# Patient Record
Sex: Female | Born: 1940 | Race: Black or African American | Hispanic: No | Smoking: Never smoker
Health system: Southern US, Community
[De-identification: ages and names within clinical notes are randomized; demographics above are authoritative.]

## PROBLEM LIST (undated history)

## (undated) DIAGNOSIS — E049 Nontoxic goiter, unspecified: Secondary | ICD-10-CM

## (undated) DIAGNOSIS — D573 Sickle-cell trait: Secondary | ICD-10-CM

## (undated) DIAGNOSIS — C50919 Malignant neoplasm of unspecified site of unspecified female breast: Principal | ICD-10-CM

## (undated) DIAGNOSIS — C801 Malignant (primary) neoplasm, unspecified: Secondary | ICD-10-CM

## (undated) DIAGNOSIS — I10 Essential (primary) hypertension: Secondary | ICD-10-CM

## (undated) DIAGNOSIS — F329 Major depressive disorder, single episode, unspecified: Secondary | ICD-10-CM

## (undated) DIAGNOSIS — E039 Hypothyroidism, unspecified: Secondary | ICD-10-CM

## (undated) HISTORY — PX: APPENDECTOMY: SHX54

## (undated) HISTORY — DX: Essential (primary) hypertension: I10

## (undated) HISTORY — DX: Nontoxic goiter, unspecified: E04.9

## (undated) HISTORY — PX: CATARACT EXTRACTION: SUR2

## (undated) HISTORY — DX: Malignant neoplasm of unspecified site of unspecified female breast: C50.919

## (undated) HISTORY — PX: ABDOMINAL HYSTERECTOMY: SHX81

## (undated) HISTORY — PX: CHOLECYSTECTOMY: SHX55

## (undated) HISTORY — PX: HEMORRHOID SURGERY: SHX153

## (undated) HISTORY — PX: TONSILECTOMY, ADENOIDECTOMY, BILATERAL MYRINGOTOMY AND TUBES: SHX2538

## (undated) HISTORY — DX: Hypothyroidism, unspecified: E03.9

## (undated) HISTORY — DX: Major depressive disorder, single episode, unspecified: F32.9

## (undated) HISTORY — PX: BIOPSY THYROID: PRO38

---

## 1963-01-08 HISTORY — PX: COLON RESECTION: SHX5231

## 2001-03-11 ENCOUNTER — Encounter: Payer: Self-pay | Admitting: Family Medicine

## 2001-03-11 ENCOUNTER — Ambulatory Visit (HOSPITAL_COMMUNITY): Admission: RE | Admit: 2001-03-11 | Discharge: 2001-03-11 | Payer: Self-pay | Admitting: Family Medicine

## 2002-07-23 ENCOUNTER — Ambulatory Visit (HOSPITAL_COMMUNITY): Admission: RE | Admit: 2002-07-23 | Discharge: 2002-07-23 | Payer: Self-pay | Admitting: Family Medicine

## 2003-08-17 ENCOUNTER — Ambulatory Visit (HOSPITAL_COMMUNITY): Admission: RE | Admit: 2003-08-17 | Discharge: 2003-08-17 | Payer: Self-pay | Admitting: Family Medicine

## 2004-09-11 ENCOUNTER — Ambulatory Visit (HOSPITAL_COMMUNITY): Admission: RE | Admit: 2004-09-11 | Discharge: 2004-09-11 | Payer: Self-pay | Admitting: Family Medicine

## 2004-10-03 ENCOUNTER — Ambulatory Visit (HOSPITAL_COMMUNITY): Admission: RE | Admit: 2004-10-03 | Discharge: 2004-10-03 | Payer: Self-pay | Admitting: Family Medicine

## 2005-05-28 ENCOUNTER — Ambulatory Visit (HOSPITAL_COMMUNITY): Admission: RE | Admit: 2005-05-28 | Discharge: 2005-05-28 | Payer: Self-pay | Admitting: Ophthalmology

## 2005-06-06 ENCOUNTER — Ambulatory Visit (HOSPITAL_COMMUNITY): Admission: RE | Admit: 2005-06-06 | Discharge: 2005-06-06 | Payer: Self-pay | Admitting: General Surgery

## 2005-06-06 ENCOUNTER — Encounter (INDEPENDENT_AMBULATORY_CARE_PROVIDER_SITE_OTHER): Payer: Self-pay | Admitting: Specialist

## 2005-06-07 ENCOUNTER — Ambulatory Visit (HOSPITAL_COMMUNITY): Admission: RE | Admit: 2005-06-07 | Discharge: 2005-06-07 | Payer: Self-pay | Admitting: General Surgery

## 2005-06-11 ENCOUNTER — Encounter (INDEPENDENT_AMBULATORY_CARE_PROVIDER_SITE_OTHER): Payer: Self-pay | Admitting: *Deleted

## 2005-06-11 ENCOUNTER — Observation Stay (HOSPITAL_COMMUNITY): Admission: RE | Admit: 2005-06-11 | Discharge: 2005-06-12 | Payer: Self-pay | Admitting: General Surgery

## 2005-11-04 ENCOUNTER — Ambulatory Visit (HOSPITAL_COMMUNITY): Admission: RE | Admit: 2005-11-04 | Discharge: 2005-11-04 | Payer: Self-pay | Admitting: Internal Medicine

## 2006-01-07 HISTORY — PX: CATARACT EXTRACTION: SUR2

## 2006-12-23 ENCOUNTER — Ambulatory Visit (HOSPITAL_COMMUNITY): Admission: RE | Admit: 2006-12-23 | Discharge: 2006-12-23 | Payer: Self-pay | Admitting: Family Medicine

## 2007-01-08 HISTORY — PX: MASTECTOMY: SHX3

## 2007-01-14 ENCOUNTER — Ambulatory Visit (HOSPITAL_COMMUNITY): Admission: RE | Admit: 2007-01-14 | Discharge: 2007-01-14 | Payer: Self-pay | Admitting: Family Medicine

## 2007-01-14 ENCOUNTER — Encounter (INDEPENDENT_AMBULATORY_CARE_PROVIDER_SITE_OTHER): Payer: Self-pay | Admitting: Diagnostic Radiology

## 2007-01-23 ENCOUNTER — Ambulatory Visit (HOSPITAL_COMMUNITY): Admission: RE | Admit: 2007-01-23 | Discharge: 2007-01-23 | Payer: Self-pay | Admitting: Family Medicine

## 2007-02-06 ENCOUNTER — Encounter (INDEPENDENT_AMBULATORY_CARE_PROVIDER_SITE_OTHER): Payer: Self-pay | Admitting: General Surgery

## 2007-02-06 ENCOUNTER — Observation Stay (HOSPITAL_COMMUNITY): Admission: RE | Admit: 2007-02-06 | Discharge: 2007-02-08 | Payer: Self-pay | Admitting: General Surgery

## 2007-02-20 ENCOUNTER — Encounter (HOSPITAL_COMMUNITY): Admission: RE | Admit: 2007-02-20 | Discharge: 2007-03-22 | Payer: Self-pay | Admitting: Oncology

## 2007-02-20 ENCOUNTER — Ambulatory Visit (HOSPITAL_COMMUNITY): Payer: Self-pay | Admitting: Oncology

## 2007-03-30 ENCOUNTER — Encounter (HOSPITAL_COMMUNITY): Admission: RE | Admit: 2007-03-30 | Discharge: 2007-04-29 | Payer: Self-pay | Admitting: Oncology

## 2007-04-01 ENCOUNTER — Ambulatory Visit: Payer: Self-pay | Admitting: Internal Medicine

## 2007-04-01 ENCOUNTER — Encounter (HOSPITAL_COMMUNITY): Payer: Self-pay | Admitting: Oncology

## 2007-04-08 ENCOUNTER — Ambulatory Visit (HOSPITAL_COMMUNITY): Admission: RE | Admit: 2007-04-08 | Discharge: 2007-04-08 | Payer: Self-pay | Admitting: General Surgery

## 2007-04-27 ENCOUNTER — Ambulatory Visit (HOSPITAL_COMMUNITY): Payer: Self-pay | Admitting: Oncology

## 2007-05-12 ENCOUNTER — Encounter (HOSPITAL_COMMUNITY): Admission: RE | Admit: 2007-05-12 | Discharge: 2007-06-11 | Payer: Self-pay | Admitting: Oncology

## 2007-07-07 ENCOUNTER — Encounter (HOSPITAL_COMMUNITY): Admission: RE | Admit: 2007-07-07 | Discharge: 2007-08-06 | Payer: Self-pay | Admitting: Family Medicine

## 2007-07-07 ENCOUNTER — Encounter (HOSPITAL_COMMUNITY): Payer: Self-pay | Admitting: Oncology

## 2007-07-07 ENCOUNTER — Ambulatory Visit: Payer: Self-pay | Admitting: Cardiovascular Disease

## 2007-07-14 ENCOUNTER — Ambulatory Visit (HOSPITAL_COMMUNITY): Payer: Self-pay | Admitting: Oncology

## 2007-08-25 ENCOUNTER — Encounter (HOSPITAL_COMMUNITY): Admission: RE | Admit: 2007-08-25 | Discharge: 2007-09-24 | Payer: Self-pay | Admitting: Oncology

## 2007-08-27 ENCOUNTER — Encounter (HOSPITAL_COMMUNITY): Admission: RE | Admit: 2007-08-27 | Discharge: 2007-09-26 | Payer: Self-pay | Admitting: Oncology

## 2007-09-15 ENCOUNTER — Ambulatory Visit (HOSPITAL_COMMUNITY): Payer: Self-pay | Admitting: Oncology

## 2007-09-28 ENCOUNTER — Encounter (HOSPITAL_COMMUNITY): Payer: Self-pay | Admitting: Oncology

## 2007-09-28 ENCOUNTER — Ambulatory Visit: Payer: Self-pay | Admitting: Cardiology

## 2007-09-28 ENCOUNTER — Encounter (HOSPITAL_COMMUNITY): Admission: RE | Admit: 2007-09-28 | Discharge: 2007-10-28 | Payer: Self-pay | Admitting: Oncology

## 2007-11-17 ENCOUNTER — Ambulatory Visit (HOSPITAL_COMMUNITY): Payer: Self-pay | Admitting: Oncology

## 2007-11-17 ENCOUNTER — Encounter (HOSPITAL_COMMUNITY): Admission: RE | Admit: 2007-11-17 | Discharge: 2007-12-17 | Payer: Self-pay | Admitting: Oncology

## 2007-12-25 ENCOUNTER — Encounter (HOSPITAL_COMMUNITY): Admission: RE | Admit: 2007-12-25 | Discharge: 2008-01-24 | Payer: Self-pay | Admitting: Oncology

## 2008-01-04 ENCOUNTER — Encounter (HOSPITAL_COMMUNITY): Payer: Self-pay | Admitting: Oncology

## 2008-01-04 ENCOUNTER — Ambulatory Visit: Payer: Self-pay | Admitting: Cardiology

## 2008-01-19 ENCOUNTER — Ambulatory Visit (HOSPITAL_COMMUNITY): Payer: Self-pay | Admitting: Oncology

## 2008-02-09 ENCOUNTER — Encounter (HOSPITAL_COMMUNITY): Admission: RE | Admit: 2008-02-09 | Discharge: 2008-03-10 | Payer: Self-pay | Admitting: Oncology

## 2008-03-21 ENCOUNTER — Encounter (HOSPITAL_COMMUNITY): Payer: Self-pay | Admitting: Oncology

## 2008-03-21 ENCOUNTER — Encounter (HOSPITAL_COMMUNITY): Admission: RE | Admit: 2008-03-21 | Discharge: 2008-04-20 | Payer: Self-pay | Admitting: Oncology

## 2008-03-21 ENCOUNTER — Ambulatory Visit: Payer: Self-pay | Admitting: Cardiology

## 2008-03-22 ENCOUNTER — Ambulatory Visit (HOSPITAL_COMMUNITY): Payer: Self-pay | Admitting: Oncology

## 2008-05-03 ENCOUNTER — Encounter (HOSPITAL_COMMUNITY): Admission: RE | Admit: 2008-05-03 | Discharge: 2008-06-02 | Payer: Self-pay | Admitting: Oncology

## 2008-05-24 ENCOUNTER — Ambulatory Visit (HOSPITAL_COMMUNITY): Payer: Self-pay | Admitting: Oncology

## 2008-06-14 ENCOUNTER — Encounter (HOSPITAL_COMMUNITY): Admission: RE | Admit: 2008-06-14 | Discharge: 2008-07-14 | Payer: Self-pay | Admitting: Oncology

## 2008-06-20 ENCOUNTER — Ambulatory Visit: Payer: Self-pay | Admitting: Cardiology

## 2008-06-20 ENCOUNTER — Encounter (HOSPITAL_COMMUNITY): Payer: Self-pay | Admitting: Oncology

## 2008-07-08 ENCOUNTER — Ambulatory Visit (HOSPITAL_COMMUNITY): Payer: Self-pay | Admitting: Oncology

## 2008-07-08 ENCOUNTER — Encounter (HOSPITAL_COMMUNITY): Admission: RE | Admit: 2008-07-08 | Discharge: 2008-08-07 | Payer: Self-pay | Admitting: Internal Medicine

## 2008-07-27 ENCOUNTER — Ambulatory Visit (HOSPITAL_COMMUNITY): Admission: RE | Admit: 2008-07-27 | Discharge: 2008-07-27 | Payer: Self-pay | Admitting: General Surgery

## 2008-07-31 IMAGING — US US SOFT TISSUE HEAD/NECK
1 series · 14 of 25 positions shown · non-contrast
Comparison: none

HISTORY: Breast cancer, persistent right thyroid nodule

[Series 1: unknown · 0.09mm/px · 14 of 35 slices shown]
[im 1/35]
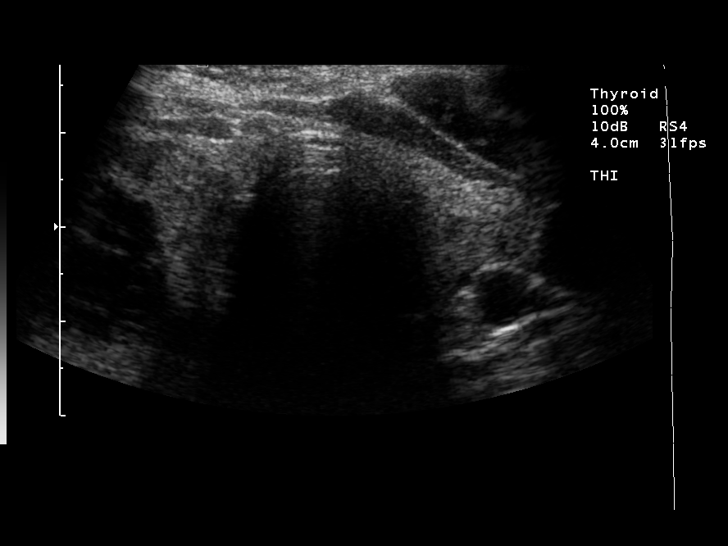
[im 3/35]
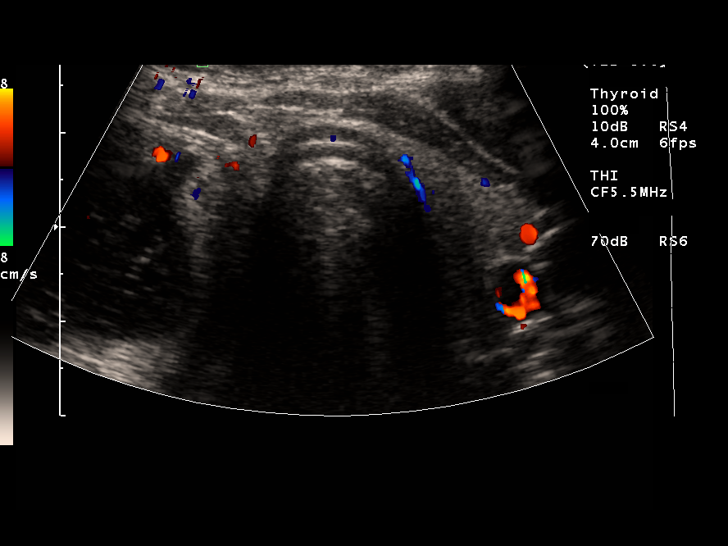
[im 6/35]
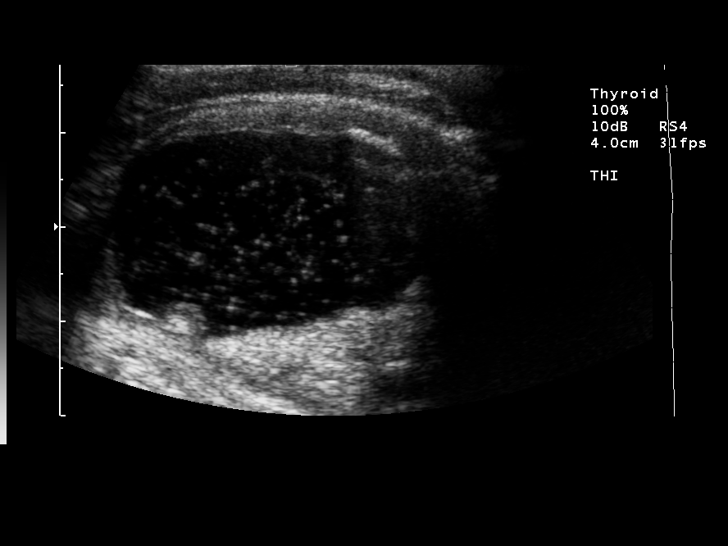
[im 9/35]
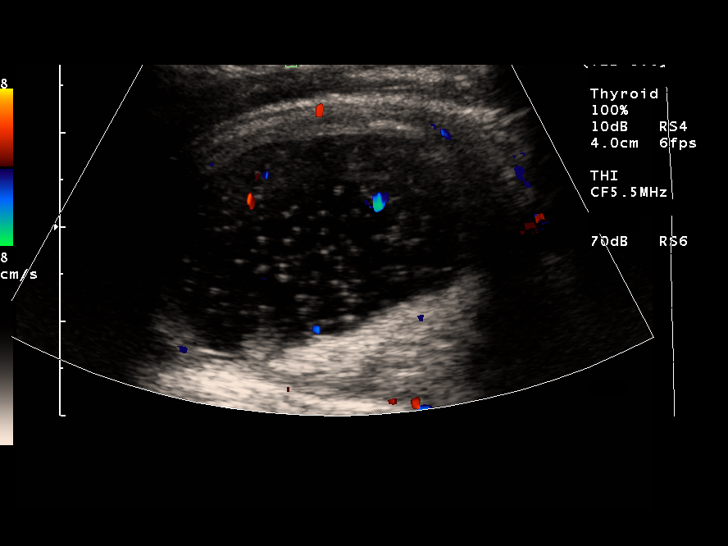
[im 12/35]
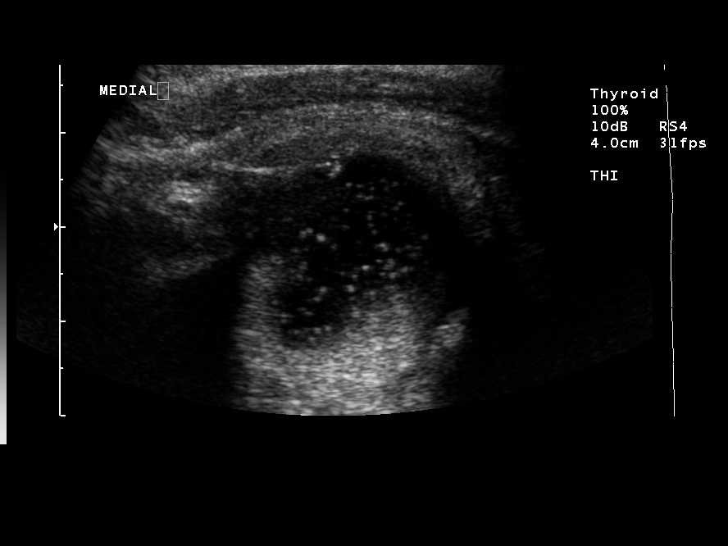
[im 13/35]
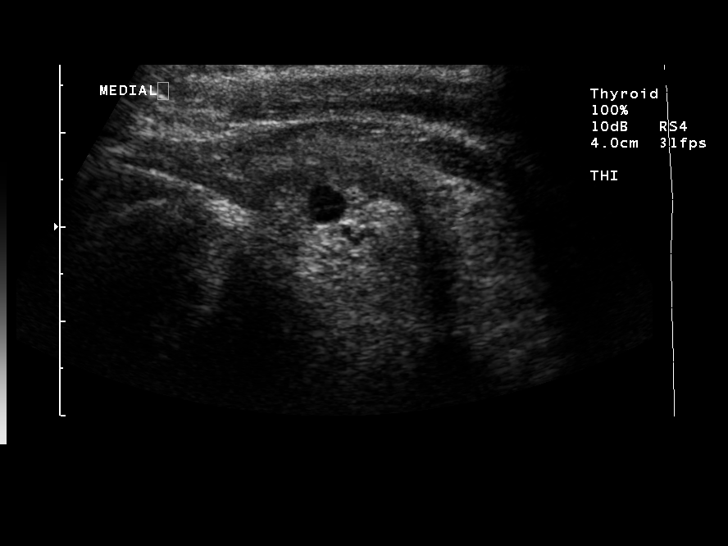
[im 16/35]
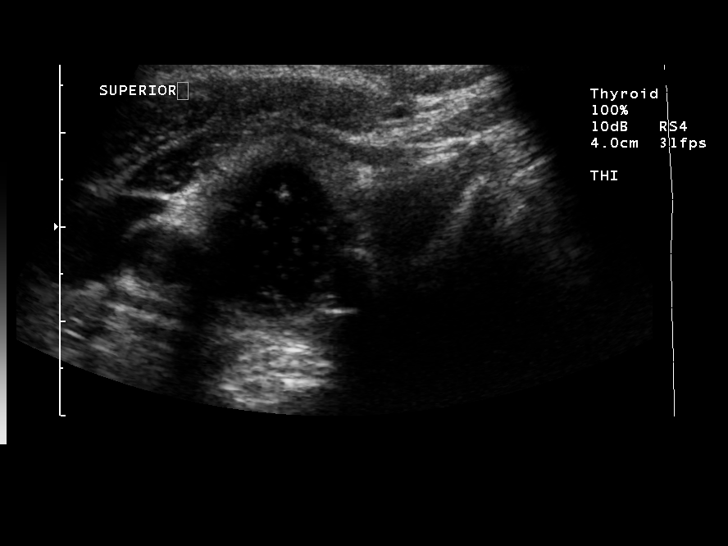
[im 19/35]
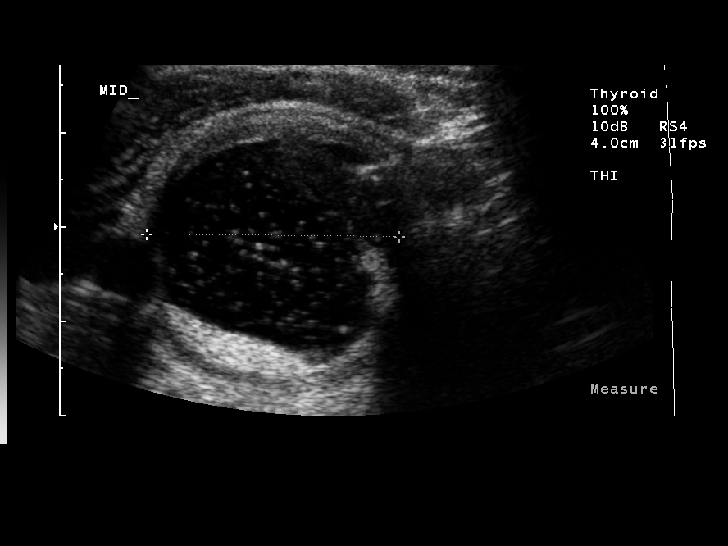
[im 22/35]
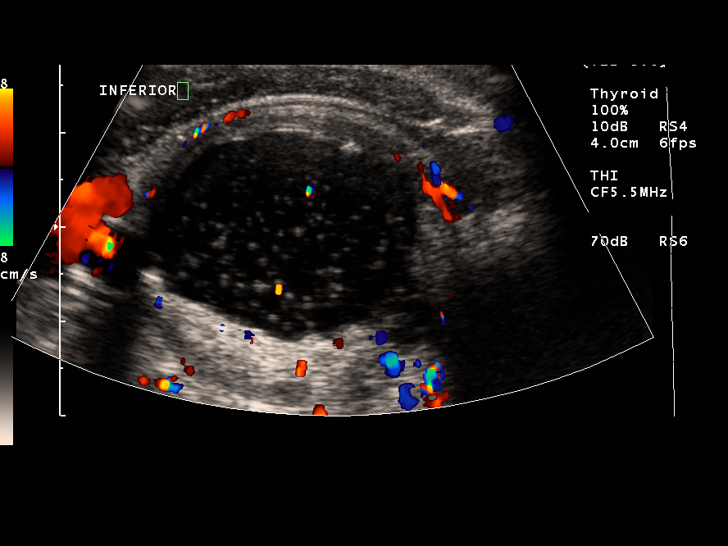
[im 23/35]
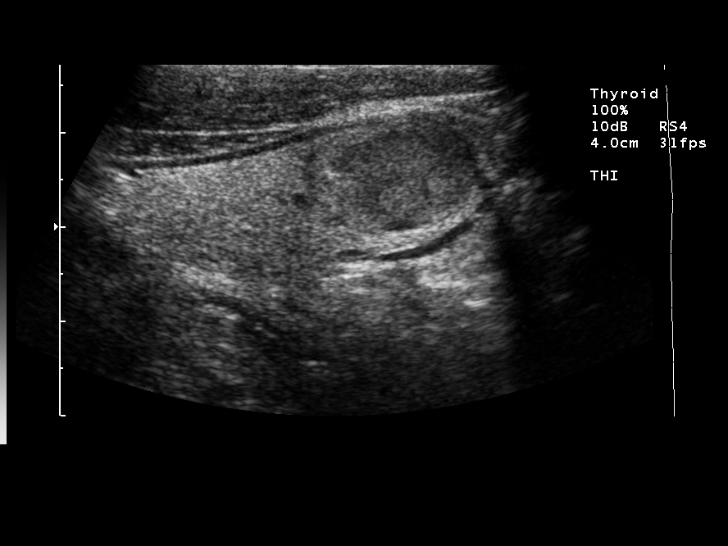
[im 26/35]
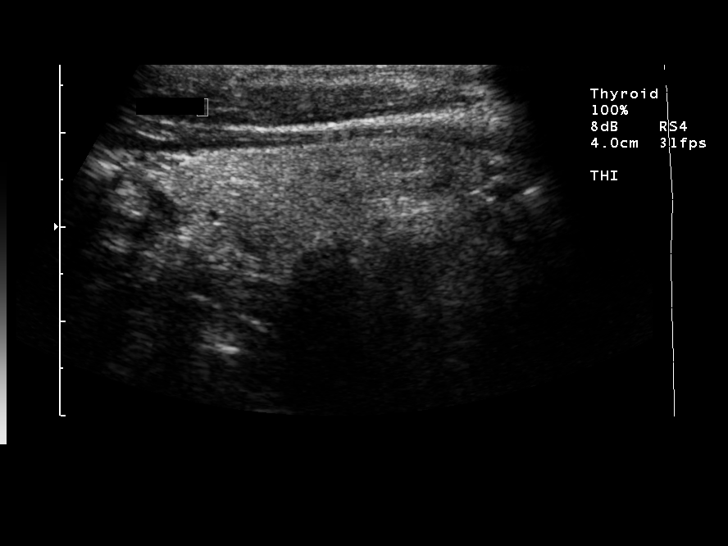
[im 29/35]
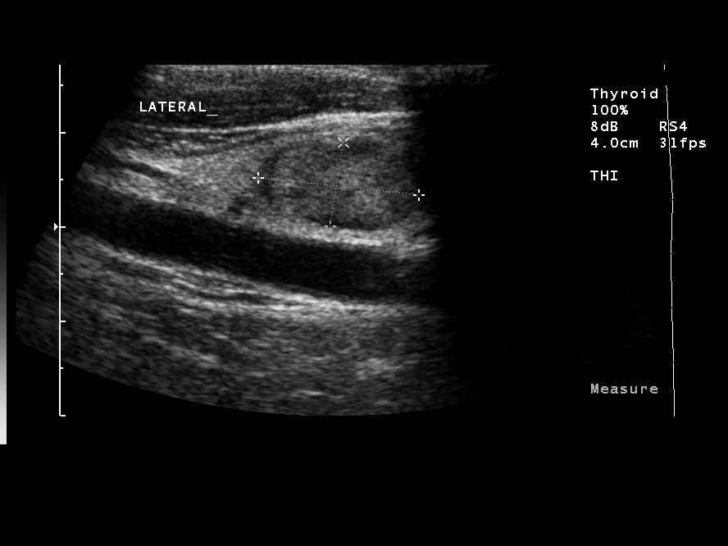
[im 32/35]
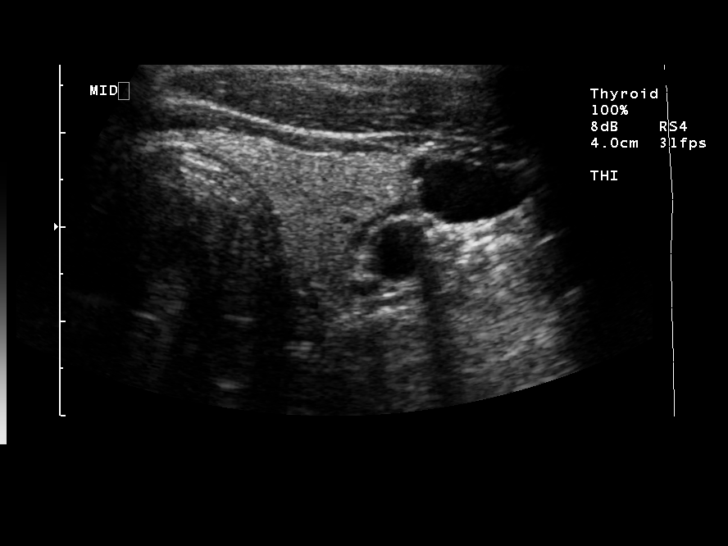
[im 35/35]
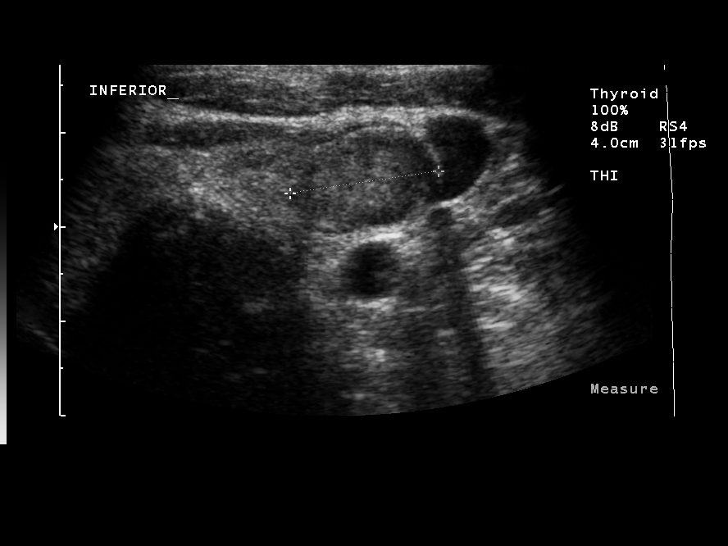

[14 of 25 positions shown; findings below may reference images not displayed]

THYROID ULTRASOUND:

Sonography of thyroid gland and regional soft tissues performed.

Right thyroid lobe measures 4.3 cm length x 2.3 cm AP x 3.3 cm transverse.
Left thyroid lobe measures 4.9 cm length x 1.6 cm AP x 1.5 cm transverse.
Thyroid isthmus 5 mm thick.
Small nodule identified within midleft thyroid lobe, 17 x 9 x 16 mm.
Large complex lesion identified within right thyroid lobe, 3.6 x 2.1 x 2.7 cm.
This lesion shows mobile internal echogenicities and dependent echogenicity
suggesting complex or hemorrhagic cyst.
No discrete mural nodule with blood flow is identified.
No regional adenopathy, or thyroid calcification.
IMPRESSION: Nonspecific 1.7 cm diameter solid-appearing nodules left thyroid lobe.
3.6 cm diameter complex cystic mass in right thyroid lobe, occupying a majority
below, containing mobile internal echogenicity the dependent echogenicity
suggesting blood or debris.
No discrete mural nodule identified.
Followup ultrasound recommended in 4 to 6 months to reassess these findings in
order to exclude thyroid tumor.

## 2008-12-12 ENCOUNTER — Ambulatory Visit (HOSPITAL_COMMUNITY): Payer: Self-pay | Admitting: Oncology

## 2008-12-16 ENCOUNTER — Ambulatory Visit: Payer: Self-pay | Admitting: Dentistry

## 2008-12-16 ENCOUNTER — Encounter: Admission: AD | Admit: 2008-12-16 | Discharge: 2008-12-16 | Payer: Self-pay | Admitting: Dentistry

## 2008-12-26 ENCOUNTER — Ambulatory Visit (HOSPITAL_COMMUNITY): Admission: RE | Admit: 2008-12-26 | Discharge: 2008-12-26 | Payer: Self-pay | Admitting: Oncology

## 2009-01-26 IMAGING — US US SOFT TISSUE HEAD/NECK
1 series · 13 of 25 positions shown · non-contrast
Comparison: 02/10/2007

CLINICAL DATA: Goiter and a hemorrhagic cyst of the thyroid gland.

THYROID ULTRASOUND
TECHNIQUE: Ultrasound examination of the thyroid gland and
adjacent soft tissues was performed.

[Series 1: unknown · 0.07mm/px · 13 of 49 slices shown]
[im 1/49]
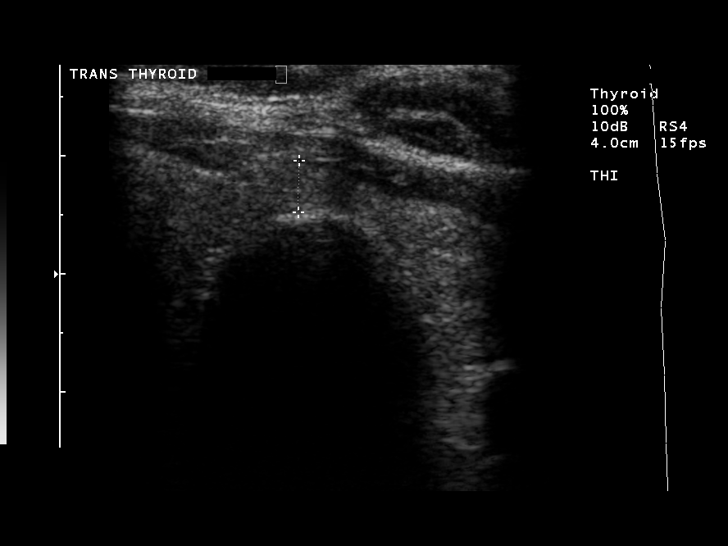
[im 5/49]
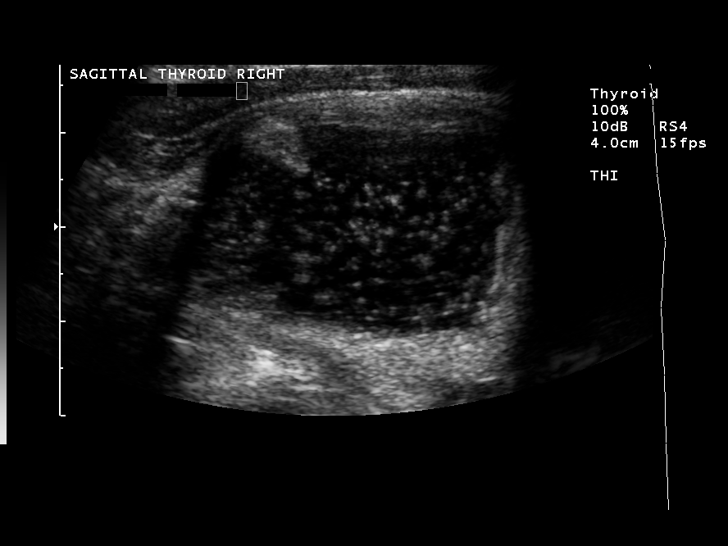
[im 9/49]
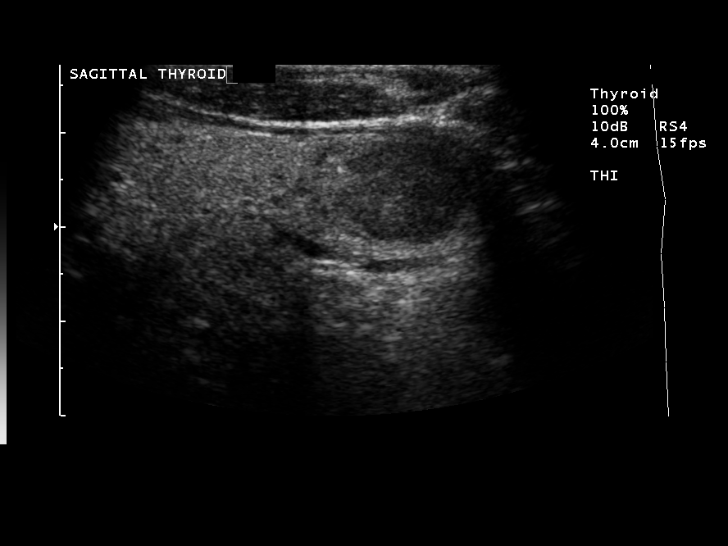
[im 13/49]
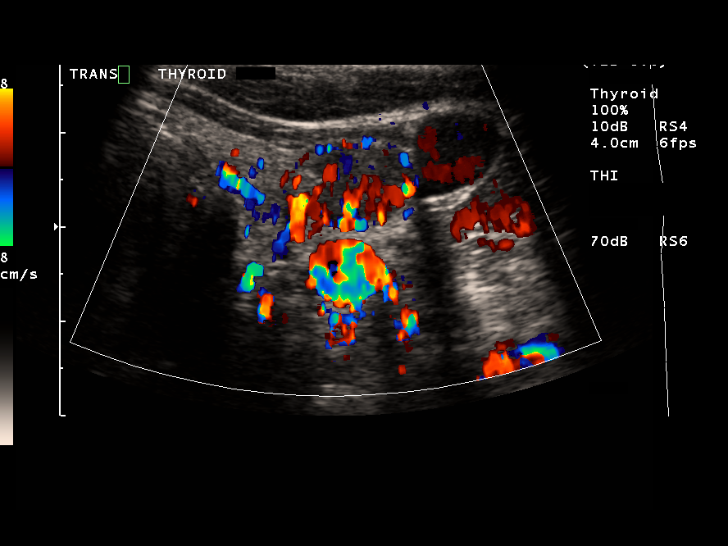
[im 17/49]
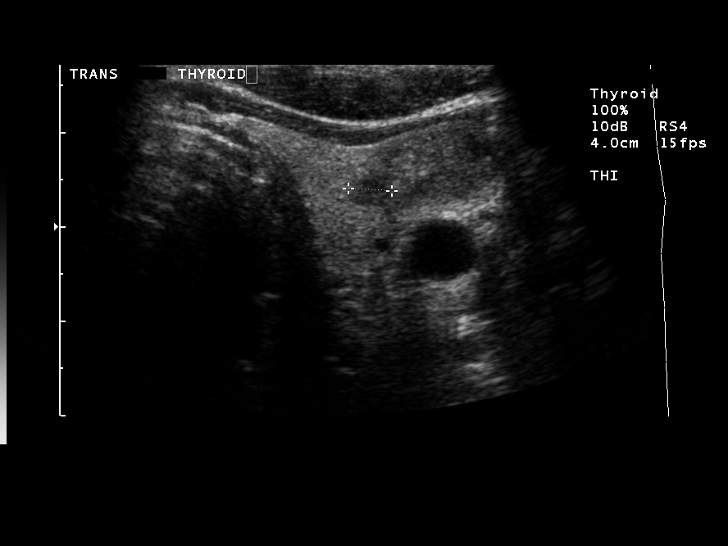
[im 21/49]
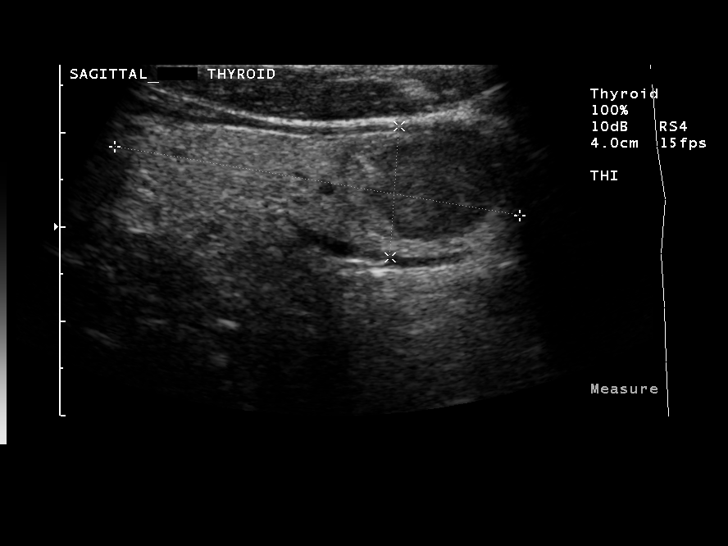
[im 25/49]
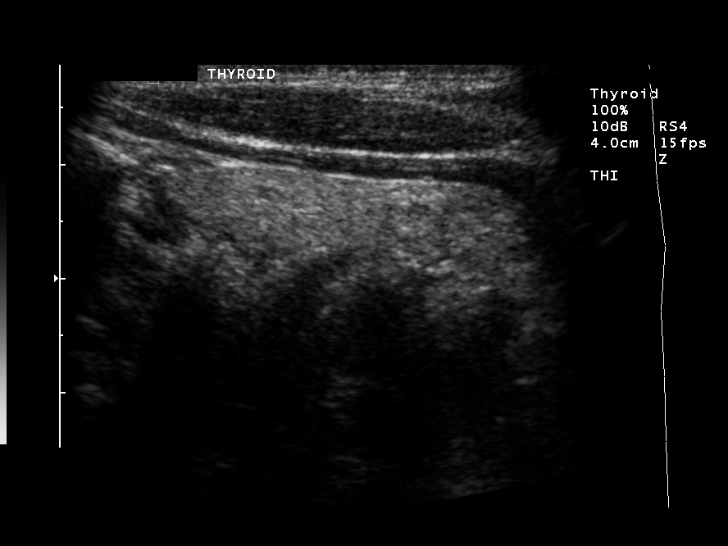
[im 29/49]
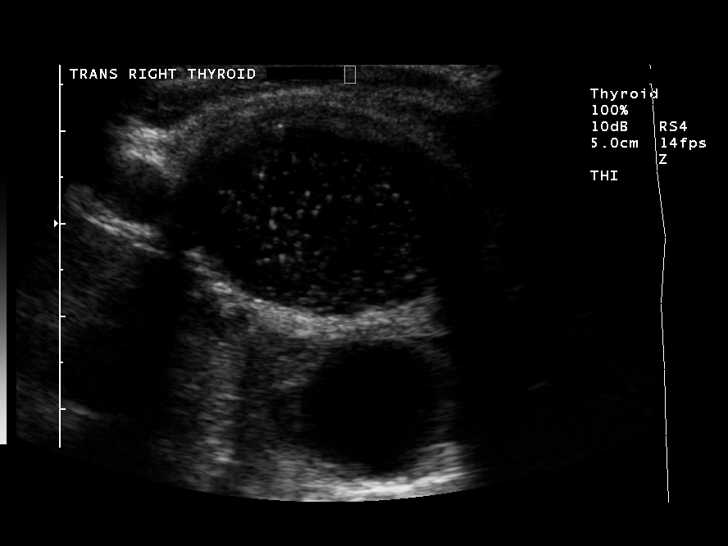
[im 33/49]
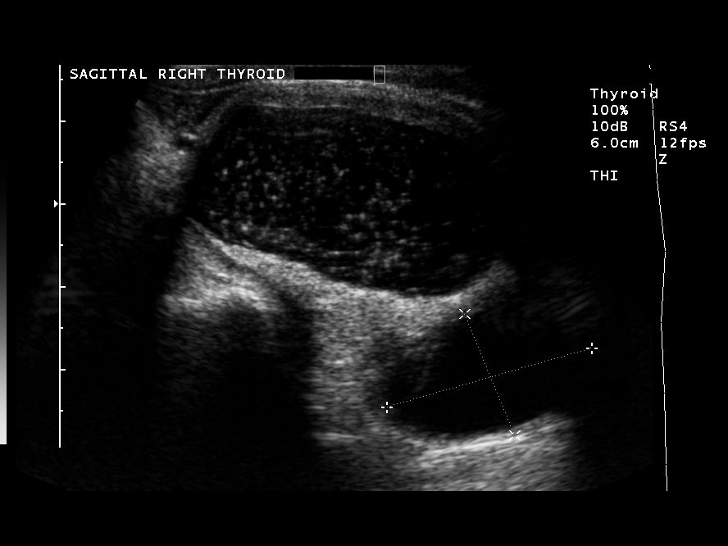
[im 37/49]
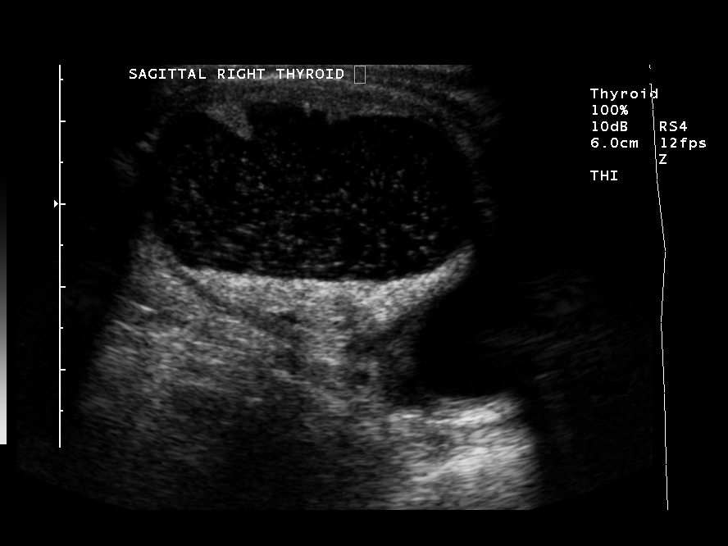
[im 41/49]
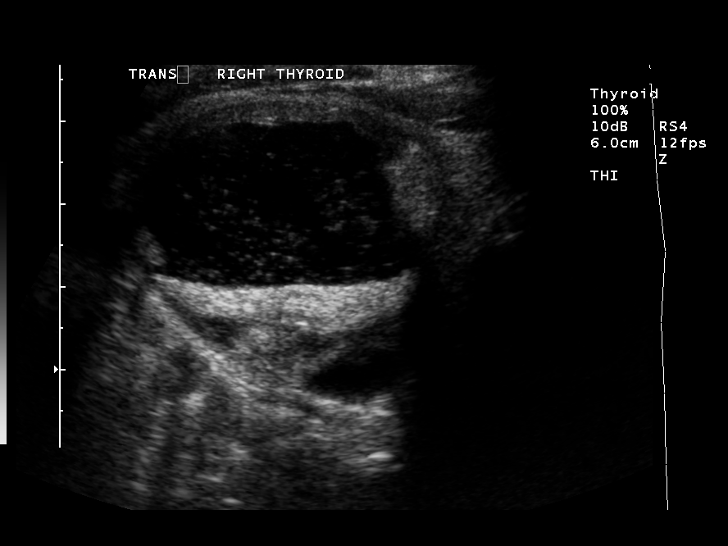
[im 45/49]
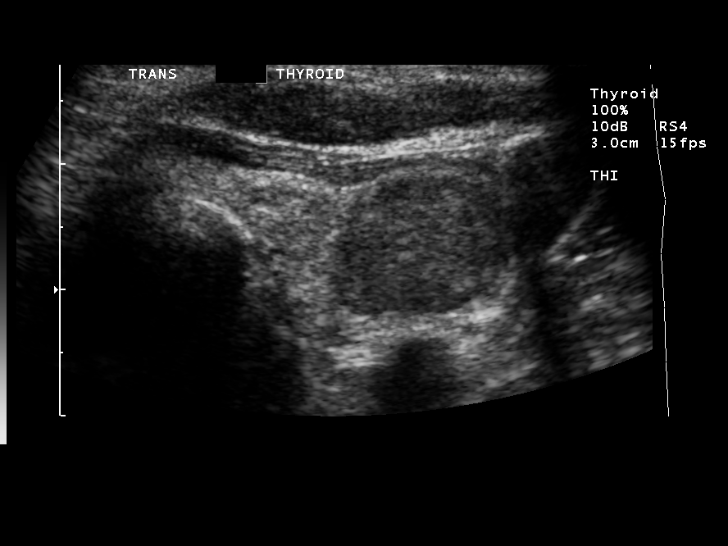
[im 49/49]
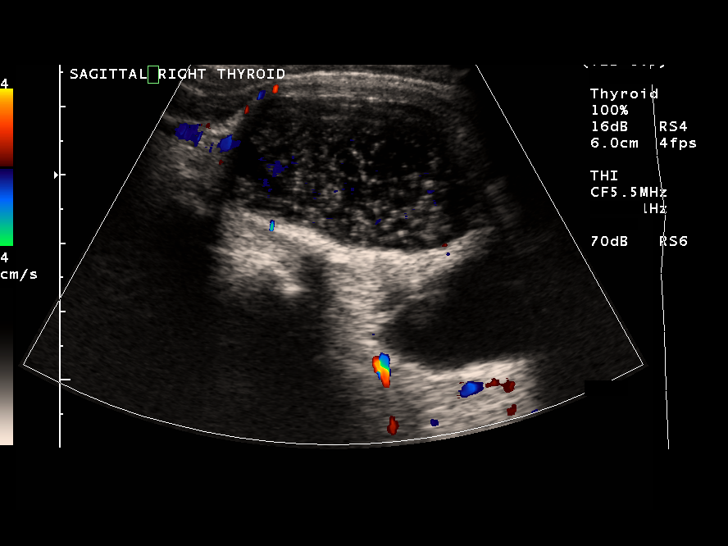

[13 of 25 positions shown; findings below may reference images not displayed]

FINDINGS: The right lobe measures 5.9 x 3.6 x 3.9 cm, increased
from the prior exam.  The dominant nodule measures 4.3 x 3.1 x
cm, increased in size.  It is primarily cystic with multiple
particles floating within the fluid.  The patient has developed a
new 2.6 x 1.7 x 2.2 cm cystic lesion just inferior and posterior to
this dominant mass.

The nodule in the lower pole on the left measures 1.7 x 1.0 x
cm, essentially unchanged.  There is a 9 mm nodule just above this
nodule in this appears essentially unchanged.

The isthmus is 4 mm thick.
IMPRESSION: Slight increase in the size of the primarily cystic lesion in the
right lobe with a new cystic lesion in the lower pole.  Findings
are most consistent with a hemorrhagic cyst and multi nodular
goiter.  If the lesion continues to expand, needle aspiration
biopsy may be useful but the patient reports she had a thyroid
biopsy in the past.  I do not know whether this nodule was the
nodule was biopsied at that time, however.

## 2009-02-06 ENCOUNTER — Ambulatory Visit (HOSPITAL_COMMUNITY): Payer: Self-pay | Admitting: Oncology

## 2009-02-06 ENCOUNTER — Encounter (HOSPITAL_COMMUNITY): Admission: RE | Admit: 2009-02-06 | Discharge: 2009-03-08 | Payer: Self-pay | Admitting: Oncology

## 2009-06-12 ENCOUNTER — Ambulatory Visit (HOSPITAL_COMMUNITY): Payer: Self-pay | Admitting: Oncology

## 2009-12-25 ENCOUNTER — Encounter (HOSPITAL_COMMUNITY)
Admission: RE | Admit: 2009-12-25 | Discharge: 2010-01-24 | Payer: Self-pay | Source: Home / Self Care | Attending: Oncology | Admitting: Oncology

## 2009-12-25 ENCOUNTER — Ambulatory Visit (HOSPITAL_COMMUNITY): Payer: Self-pay | Admitting: Oncology

## 2009-12-28 ENCOUNTER — Ambulatory Visit (HOSPITAL_COMMUNITY)
Admission: RE | Admit: 2009-12-28 | Discharge: 2009-12-28 | Payer: Self-pay | Source: Home / Self Care | Attending: Oncology | Admitting: Oncology

## 2010-01-28 ENCOUNTER — Encounter (HOSPITAL_COMMUNITY): Payer: Self-pay | Admitting: Oncology

## 2010-01-29 ENCOUNTER — Encounter (HOSPITAL_COMMUNITY): Payer: Self-pay | Admitting: Oncology

## 2010-03-19 LAB — COMPREHENSIVE METABOLIC PANEL
AST: 18 U/L (ref 0–37)
Albumin: 3.9 g/dL (ref 3.5–5.2)
Alkaline Phosphatase: 75 U/L (ref 39–117)
BUN: 14 mg/dL (ref 6–23)
CO2: 31 mEq/L (ref 19–32)
Chloride: 101 mEq/L (ref 96–112)
GFR calc Af Amer: >60 mL/min (ref >60)
GFR calc non Af Amer: >60 mL/min (ref >60)
Glucose, Bld: 99 mg/dL (ref 70–99)
Total Bilirubin: 0.5 mg/dL (ref 0.3–1.2)
Total Protein: 6.9 g/dL (ref 6.0–8.3)

## 2010-03-19 LAB — CBC
HCT: 40.9 % (ref 36.0–46.0)
Hemoglobin: 14.2 g/dL (ref 12.0–15.0)
MCH: 30.6 pg (ref 26.0–34.0)
MCV: 88.1 fL (ref 78.0–100.0)
RBC: 4.64 MIL/uL (ref 3.87–5.11)
RDW: 12.9 % (ref 11.5–15.5)

## 2010-03-19 LAB — DIFFERENTIAL
Basophils Relative: 1 % (ref 0–1)
Eosinophils Absolute: 0.4 10*3/uL (ref 0.0–0.7)
Lymphocytes Relative: 47 % — ABNORMAL HIGH (ref 12–46)
Lymphs Abs: 3 10*3/uL (ref 0.7–4.0)
Monocytes Relative: 6 % (ref 3–12)
Neutrophils Relative %: 40 % — ABNORMAL LOW (ref 43–77)

## 2010-03-25 LAB — BASIC METABOLIC PANEL
BUN: 11 mg/dL (ref 6–23)
GFR calc Af Amer: >60 mL/min (ref >60)
GFR calc non Af Amer: >60 mL/min (ref >60)
Potassium: 3.2 mEq/L — ABNORMAL LOW (ref 3.5–5.1)

## 2010-04-15 LAB — T4, FREE: Free T4: 1.06 ng/dL (ref 0.80–1.80)

## 2010-04-16 LAB — COMPREHENSIVE METABOLIC PANEL
ALT: 14 U/L (ref 0–35)
AST: 19 U/L (ref 0–37)
Albumin: 3.4 g/dL — ABNORMAL LOW (ref 3.5–5.2)
Calcium: 9.3 mg/dL (ref 8.4–10.5)
Chloride: 102 mEq/L (ref 96–112)
Creatinine, Ser: 0.78 mg/dL (ref 0.4–1.2)
GFR calc Af Amer: >60 mL/min (ref >60)
GFR calc Af Amer: >60 mL/min (ref >60)
Sodium: 139 mEq/L (ref 135–145)
Sodium: 140 mEq/L (ref 135–145)

## 2010-04-16 LAB — DIFFERENTIAL
Basophils Absolute: 0 10*3/uL (ref 0.0–0.1)
Basophils Relative: 0 % (ref 0–1)
Eosinophils Relative: 4 % (ref 0–5)
Eosinophils Relative: 5 % (ref 0–5)
Lymphocytes Relative: 38 % (ref 12–46)
Lymphocytes Relative: 45 % (ref 12–46)
Lymphs Abs: 2.3 10*3/uL (ref 0.7–4.0)
Lymphs Abs: 2.5 10*3/uL (ref 0.7–4.0)
Monocytes Absolute: 0.5 10*3/uL (ref 0.1–1.0)
Monocytes Relative: 9 % (ref 3–12)
Neutro Abs: 2.8 10*3/uL (ref 1.7–7.7)

## 2010-04-16 LAB — CBC
HCT: 36.7 % (ref 36.0–46.0)
MCHC: 36.1 g/dL — ABNORMAL HIGH (ref 30.0–36.0)
MCV: 90.3 fL (ref 78.0–100.0)
Platelets: 165 10*3/uL (ref 150–400)
RBC: 4.03 MIL/uL (ref 3.87–5.11)
RDW: 12.9 % (ref 11.5–15.5)
RDW: 13.4 % (ref 11.5–15.5)

## 2010-04-17 LAB — CBC
HCT: 38.5 % (ref 36.0–46.0)
MCHC: 35.2 g/dL (ref 30.0–36.0)
MCV: 90.4 fL (ref 78.0–100.0)
Platelets: 129 10*3/uL — ABNORMAL LOW (ref 150–400)
RDW: 13.1 % (ref 11.5–15.5)

## 2010-04-17 LAB — COMPREHENSIVE METABOLIC PANEL
Albumin: 3.5 g/dL (ref 3.5–5.2)
BUN: 19 mg/dL (ref 6–23)
Calcium: 9.4 mg/dL (ref 8.4–10.5)
Chloride: 102 mEq/L (ref 96–112)
Creatinine, Ser: 0.81 mg/dL (ref 0.4–1.2)
Total Bilirubin: 0.3 mg/dL (ref 0.3–1.2)
Total Protein: 6.7 g/dL (ref 6.0–8.3)

## 2010-04-17 LAB — DIFFERENTIAL
Basophils Absolute: 0 10*3/uL (ref 0.0–0.1)
Lymphocytes Relative: 46 % (ref 12–46)
Monocytes Absolute: 0.4 10*3/uL (ref 0.1–1.0)
Neutro Abs: 1.6 10*3/uL — ABNORMAL LOW (ref 1.7–7.7)

## 2010-04-18 LAB — COMPREHENSIVE METABOLIC PANEL
ALT: 15 U/L (ref 0–35)
AST: 18 U/L (ref 0–37)
Alkaline Phosphatase: 65 U/L (ref 39–117)
Alkaline Phosphatase: 70 U/L (ref 39–117)
BUN: 13 mg/dL (ref 6–23)
CO2: 30 mEq/L (ref 19–32)
Chloride: 103 mEq/L (ref 96–112)
GFR calc non Af Amer: >60 mL/min (ref >60)
GFR calc non Af Amer: >60 mL/min (ref >60)
Glucose, Bld: 95 mg/dL (ref 70–99)
Glucose, Bld: 114 mg/dL — ABNORMAL HIGH (ref 70–99)
Potassium: 3.4 mEq/L — ABNORMAL LOW (ref 3.5–5.1)
Potassium: 3.4 mEq/L — ABNORMAL LOW (ref 3.5–5.1)
Sodium: 139 mEq/L (ref 135–145)
Total Bilirubin: 0.5 mg/dL (ref 0.3–1.2)
Total Protein: 6.5 g/dL (ref 6.0–8.3)
Total Protein: 6.8 g/dL (ref 6.0–8.3)

## 2010-04-18 LAB — DIFFERENTIAL
Basophils Absolute: 0 10*3/uL (ref 0.0–0.1)
Basophils Relative: 1 % (ref 0–1)
Basophils Relative: 0 % (ref 0–1)
Eosinophils Absolute: 0.2 10*3/uL (ref 0.0–0.7)
Eosinophils Relative: 4 % (ref 0–5)
Monocytes Relative: 10 % (ref 3–12)
Neutro Abs: 1.8 10*3/uL (ref 1.7–7.7)
Neutrophils Relative %: 39 % — ABNORMAL LOW (ref 43–77)
Neutrophils Relative %: 46 % (ref 43–77)

## 2010-04-18 LAB — CBC
HCT: 39.5 % (ref 36.0–46.0)
Hemoglobin: 13.7 g/dL (ref 12.0–15.0)
Hemoglobin: 13.8 g/dL (ref 12.0–15.0)
RBC: 4.32 MIL/uL (ref 3.87–5.11)
RDW: 12.8 % (ref 11.5–15.5)

## 2010-04-19 LAB — DIFFERENTIAL
Basophils Absolute: 0 10*3/uL (ref 0.0–0.1)
Basophils Relative: 0 % (ref 0–1)
Eosinophils Absolute: 0.2 10*3/uL (ref 0.0–0.7)
Eosinophils Relative: 2 % (ref 0–5)
Lymphocytes Relative: 34 % (ref 12–46)
Lymphs Abs: 2.2 10*3/uL (ref 0.7–4.0)
Monocytes Absolute: 0.5 10*3/uL (ref 0.1–1.0)
Monocytes Relative: 8 % (ref 3–12)
Neutro Abs: 3.6 10*3/uL (ref 1.7–7.7)
Neutrophils Relative %: 55 % (ref 43–77)

## 2010-04-19 LAB — COMPREHENSIVE METABOLIC PANEL
ALT: 11 U/L (ref 0–35)
AST: 14 U/L (ref 0–37)
Albumin: 3.5 g/dL (ref 3.5–5.2)
Alkaline Phosphatase: 88 U/L (ref 39–117)
BUN: 13 mg/dL (ref 6–23)
CO2: 34 mEq/L — ABNORMAL HIGH (ref 19–32)
Calcium: 9.9 mg/dL (ref 8.4–10.5)
Chloride: 101 mEq/L (ref 96–112)
Creatinine, Ser: 0.75 mg/dL (ref 0.4–1.2)
GFR calc Af Amer: >60 mL/min (ref >60)
GFR calc non Af Amer: >60 mL/min (ref >60)
Glucose, Bld: 97 mg/dL (ref 70–99)
Potassium: 3.4 mEq/L — ABNORMAL LOW (ref 3.5–5.1)
Sodium: 138 mEq/L (ref 135–145)
Total Bilirubin: 0.5 mg/dL (ref 0.3–1.2)
Total Protein: 6.9 g/dL (ref 6.0–8.3)

## 2010-04-19 LAB — CBC
MCHC: 34.2 g/dL (ref 30.0–36.0)
Platelets: 160 10*3/uL (ref 150–400)
RBC: 4.29 MIL/uL (ref 3.87–5.11)

## 2010-04-23 LAB — DIFFERENTIAL
Basophils Absolute: 0 10*3/uL (ref 0.0–0.1)
Eosinophils Relative: 5 % (ref 0–5)
Lymphocytes Relative: 45 % (ref 12–46)
Lymphs Abs: 2 10*3/uL (ref 0.7–4.0)
Monocytes Absolute: 0.4 10*3/uL (ref 0.1–1.0)
Neutro Abs: 1.8 10*3/uL (ref 1.7–7.7)

## 2010-04-23 LAB — CBC
HCT: 38.6 % (ref 36.0–46.0)
MCHC: 33.6 g/dL (ref 30.0–36.0)
MCV: 90.2 fL (ref 78.0–100.0)
Platelets: 147 10*3/uL — ABNORMAL LOW (ref 150–400)

## 2010-04-23 LAB — COMPREHENSIVE METABOLIC PANEL
AST: 17 U/L (ref 0–37)
Albumin: 3.4 g/dL — ABNORMAL LOW (ref 3.5–5.2)
BUN: 11 mg/dL (ref 6–23)
Calcium: 9.4 mg/dL (ref 8.4–10.5)
Creatinine, Ser: 0.74 mg/dL (ref 0.4–1.2)
GFR calc Af Amer: >60 mL/min (ref >60)
GFR calc non Af Amer: >60 mL/min (ref >60)
Total Bilirubin: 0.5 mg/dL (ref 0.3–1.2)

## 2010-04-24 LAB — CBC
HCT: 38.6 % (ref 36.0–46.0)
HCT: 37.7 % (ref 36.0–46.0)
Hemoglobin: 13.3 g/dL (ref 12.0–15.0)
Hemoglobin: 13 g/dL (ref 12.0–15.0)
MCV: 90.8 fL (ref 78.0–100.0)
Platelets: 164 10*3/uL (ref 150–400)
RBC: 4.14 MIL/uL (ref 3.87–5.11)
RDW: 13.8 % (ref 11.5–15.5)
WBC: 5.6 10*3/uL (ref 4.0–10.5)
WBC: 4.9 10*3/uL (ref 4.0–10.5)

## 2010-04-24 LAB — COMPREHENSIVE METABOLIC PANEL
ALT: 12 U/L (ref 0–35)
Alkaline Phosphatase: 74 U/L (ref 39–117)
Alkaline Phosphatase: 84 U/L (ref 39–117)
BUN: 12 mg/dL (ref 6–23)
CO2: 31 mEq/L (ref 19–32)
Chloride: 98 mEq/L (ref 96–112)
Chloride: 98 mEq/L (ref 96–112)
Creatinine, Ser: 0.77 mg/dL (ref 0.4–1.2)
GFR calc non Af Amer: >60 mL/min (ref >60)
Glucose, Bld: 109 mg/dL — ABNORMAL HIGH (ref 70–99)
Glucose, Bld: 111 mg/dL — ABNORMAL HIGH (ref 70–99)
Potassium: 3.4 mEq/L — ABNORMAL LOW (ref 3.5–5.1)
Potassium: 3.6 mEq/L (ref 3.5–5.1)
Sodium: 138 mEq/L (ref 135–145)
Total Bilirubin: 0.4 mg/dL (ref 0.3–1.2)
Total Bilirubin: 0.4 mg/dL (ref 0.3–1.2)
Total Protein: 6.4 g/dL (ref 6.0–8.3)

## 2010-04-24 LAB — DIFFERENTIAL
Basophils Absolute: 0 10*3/uL (ref 0.0–0.1)
Basophils Absolute: 0 10*3/uL (ref 0.0–0.1)
Basophils Relative: 0 % (ref 0–1)
Basophils Relative: 0 % (ref 0–1)
Eosinophils Absolute: 0.3 10*3/uL (ref 0.0–0.7)
Lymphocytes Relative: 34 % (ref 12–46)
Monocytes Absolute: 0.4 10*3/uL (ref 0.1–1.0)
Monocytes Relative: 8 % (ref 3–12)
Neutro Abs: 2.9 10*3/uL (ref 1.7–7.7)
Neutrophils Relative %: 53 % (ref 43–77)
Neutrophils Relative %: 48 % (ref 43–77)

## 2010-05-22 NOTE — Op Note (Signed)
NAMEJILLENE, Casey Lopez             ACCOUNT NO.:  [id number]   MEDICAL RECORD NO.:  [id number]          PATIENT TYPE:  AMB   LOCATION:  DAY                           FACILITY:  [facility]   PHYSICIAN:  Dalia Heading, M.D.  DATE OF BIRTH:  1940/08/14   DATE OF PROCEDURE:  04/08/2007  DATE OF DISCHARGE:                               OPERATIVE REPORT   PREOPERATIVE DIAGNOSIS:  Left breast carcinoma, need for central venous  access, wound dehiscence.   POSTOPERATIVE DIAGNOSIS:  Left breast carcinoma, need for central venous  access, wound dehiscence.   PROCEDURE:  Port-A-Cath insertion, closure of left breast wound.   SURGEON:  Dr. Franky Macho.   ANESTHESIA:  General.   INDICATIONS:  The patient is a 70 year old black female status post left  modified radical mastectomy, who now presents for Port-A-Cath insertion  for central venous access for chemotherapy as well as closure of the  superficial wound dehiscence along the midportion of the left mastectomy  scar.  Risks and benefits of the procedures including bleeding,  infection, pneumothorax were fully explained to the patient, who gave  informed consent.   PROCEDURE NOTE:  The patient was placed in the Trendelenburg position  after the upper chest was prepped and draped in the usual sterile  technique with Betadine.  Surgical site confirmation was performed.  1%  Xylocaine was injected for local anesthesia.   Transverse incision was made below the right clavicle.  Subcutaneous  pocket was then formed.  A needle was advanced into the right subclavian  vein using the Seldinger technique without difficulty.  The guidewire  was then advanced under fluoroscopy.  An introducer and peel-away sheath  were then placed over the guidewire.  The catheter was inserted through  the peel-away sheath and peel-away sheath was removed.  The catheter was  then attached to the port.  The port placed in subcutaneous pocket.  Adequate position was  confirmed by fluoroscopy.  The port was flushed  with 3000 units of heparin.  The subcutaneous layer was reapproximated  using a 3-0 Vicryl interrupted suture.  The skin was closed using a 4-0  Vicryl subcuticular suture.  Dermabond was then applied.   Next, the wound dehiscence and the left mastectomy scar was addressed.  Any necrotic tissue was debrided.  Subcutaneous flaps were then formed  superiorly and inferiorly.  The incision was closed using 3-0 nylon  interrupted sutures.  Betadine ointment and dry sterile dressings were  applied.   All tape and needle counts were correct at the end of the procedure.  The patient was awakened and transferred to PACU in stable condition.  A  chest x-ray will be performed at that time.   COMPLICATIONS:  None.   SPECIMEN:  None.   BLOOD LOSS:  Minimal.      Dalia Heading, M.D.  Electronically Signed     MAJ/MEDQ  D:  04/08/2007  T:  04/08/2007  Job:  [id number]   cc:   Ladona Horns. Mariel Sleet, MD  Fax: [phone]   Corrie Mckusick, M.D.  Fax: [phone]

## 2010-05-22 NOTE — Op Note (Signed)
Casey Lopez, Casey Lopez             ACCOUNT NO.:  [id number]   MEDICAL RECORD NO.:  [id number]          PATIENT TYPE:  OBV   LOCATION:  A307                          FACILITY:  [facility]   PHYSICIAN:  Dalia Heading, M.D.  DATE OF BIRTH:  [birth year]   DATE OF PROCEDURE:  02/06/2007  DATE OF DISCHARGE:                               OPERATIVE REPORT   PREOPERATIVE DIAGNOSIS:  Left breast carcinoma.   POSTOPERATIVE DIAGNOSIS:  Left breast carcinoma.   PROCEDURE:  Left modified radical mastectomy.   SURGEON:  Dalia Heading, M.D.   ANESTHESIA:  General endotracheal.   INDICATIONS:  The patient is a 70 year old black female who has biopsy  proven left breast carcinoma.  After extensive discussion with the  patient about her options, she elected to proceed with a left modified  radical mastectomy.  The risks and benefits of the procedure including  bleeding, infection, cardiopulmonary difficulties, nerve injury and the  possibly of arm swelling were fully explained to the patient who gave  informed consent.   PROCEDURE IN DETAIL:  The patient was placed in the supine position.  After induction of general endotracheal anesthesia the left breast and  axilla were prepped and draped using the usual sterile technique with  Betadine.  Surgical site confirmation was performed.   An elliptical incision was made medial to lateral around the left  nipple.  A superior flap was then formed up to the clavicle and an  inferior flap formed to the chest wall.  The breast was then removed  from the pectoralis major muscle medial to lateral using Bovie  electrocautery.  A level II axillary dissection was then performed.  Care was taken to avoid the thoracodorsal artery vein and nerve as well  as the long thoracic nerve.  Several matted enlarged lymph nodes were  found.  These were removed along with the left breast in continuity  without difficulty.  A short suture was placed superiorly and a long  suture laterally on the breast for orientation purposes.  The specimen  was then sent to pathology for further examination.  Any bleeding was  controlled using Bovie electrocautery or small clips.  A superior JP  drain was placed under the flap and an inferior drain was placed into  the left axilla.  Both were secured at the skin level using 3-0 nylon  interrupted sutures.  The wound was copiously irrigated with normal  saline.  The subcutaneous layer was reapproximated using 2-0 Vicryl  interrupted sutures.  The skin was closed using staples.  Sensorcaine  0.5% was instilled in the surrounding wound.  Betadine ointment and dry  sterile dressing were applied.   All tape and needle counts were correct at the end of the procedure.  The patient was extubated in the operating room and went back to the  recovery room awake and in stable condition.   COMPLICATIONS:  None.   SPECIMEN:  Left breast and axilla.   BLOOD LOSS:  One hundred mL.   DRAINS:  Al Pimple drains to breast flap superior and Al Pimple  drain placed to left  axilla inferior.      Dalia Heading, M.D.  Electronically Signed     MAJ/MEDQ  D:  02/06/2007  T:  02/06/2007  Job:  [id number]   cc:   Corrie Mckusick, M.D.  Fax: [phone]

## 2010-05-22 NOTE — H&P (Signed)
Casey Lopez, Casey Lopez             ACCOUNT NO.:  [id number]   MEDICAL RECORD NO.:  [id number]          PATIENT TYPE:  AMB   LOCATION:  DAY                           FACILITY:  [facility]   PHYSICIAN:  Dalia Heading, M.D.  DATE OF BIRTH:  Jul 01, 1940   DATE OF ADMISSION:  DATE OF DISCHARGE:  LH                              HISTORY & PHYSICAL   CHIEF COMPLAINT:  Left breast carcinoma.   HISTORY OF PRESENT ILLNESS:  The patient is a 70 year old black female  who is referred for evaluation and treatment of left breast carcinoma.  This was found on routine mammography.  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  There is no family history of breast carcinoma.   PAST MEDICAL HISTORY:  Includes hypertension, hypothyroidism,  depression.   PAST SURGICAL HISTORY:  Hemorrhoidectomy, EGD, right eye cataract  surgery.   CURRENT MEDICATIONS:  1. Dyazide/Inderal 80 mg p.o. daily.  2. Synthroid 50 mcg p.o. daily.  3. Lexapro 10 mg p.o. daily.   ALLERGIES:  No known drug allergies.   REVIEW OF SYSTEMS:  The patient smokes a couple of cigarettes a day.  She denies alcohol use.  She denies any other cardiopulmonary  difficulties or bleeding disorders.   PHYSICAL EXAMINATION:  GENERAL:  The patient is a well-developed, well-  nourished, black female in no acute distress.  NECK:  Supple with an enlarged right lobe of the thyroid noted.  LUNGS:  Clear to auscultation with equal breath sounds bilaterally.  HEART:  Reveals regular rate and rhythm without S3, S4, or murmurs.  ABDOMEN:  Soft, nontender, nondistended.  No hepatosplenomegaly or  masses are noted.  BREASTS:  Right breast examination reveals no dominant mass, nipple  discharge, or dimpling.  The axilla is negative for palpable nodes.  Left breast examination reveals a dominant mass at the 7 o'clock  position.  No nipple discharge or dimpling is noted.  The axilla is  negative for palpable nodes.   IMPRESSION:  Left breast  carcinoma.   PLAN:  The patient is scheduled for left modified radical mastectomy on  February 06, 2007.  Risks and benefits of the procedure including  bleeding, infection, pain, and arm swelling were fully explained to the  patient, gave informed consent.      Dalia Heading, M.D.  Electronically Signed     MAJ/MEDQ  D:  01/27/2007  T:  01/27/2007  Job:  [id number]   cc:   Dalia Heading, M.D.  Fax: [phone]   Corrie Mckusick, M.D.  Fax: [phone]

## 2010-05-22 NOTE — Op Note (Signed)
Casey Lopez, Casey Lopez             ACCOUNT NO.:  [id number]   MEDICAL RECORD NO.:  [id number]          PATIENT TYPE:  AMB   LOCATION:  DAY                           FACILITY:  [facility]   PHYSICIAN:  Dalia Heading, M.D.  DATE OF BIRTH:  03-12-1940   DATE OF PROCEDURE:  07/27/2008  DATE OF DISCHARGE:                               OPERATIVE REPORT   PREOPERATIVE DIAGNOSIS:  Finished with chemotherapy, left breast  carcinoma.   POSTOPERATIVE DIAGNOSIS:  Finished with chemotherapy, left breast  carcinoma.   PROCEDURE:  Port-A-Cath removal.   SURGEON:  Dalia Heading, MD   ANESTHESIA:  Local.   INDICATIONS:  The patient is a 70 year old black female status post Port-  A-Cath placement approximately 2 years ago for treatment of left breast  carcinoma, who now presents for Port-A-Cath removal.  Risks and benefits  of the procedure were fully explained to the patient, gave informed  consent.   PROCEDURE NOTE:  The patient was placed in the supine position.  The  right upper chest was prepped and draped using the usual sterile  technique with DuraPrep.  Surgical site confirmation was performed.  Xylocaine 1% was used for local anesthesia.   An incision was made through the previous surgical incision.  The  dissection was taken down to the port, and the port was removed without  difficulty.  It was disposed.  No abnormal bleeding was noted at the end  of the procedure.  The subcutaneous layer was reapproximated using a 3-0  Vicryl interrupted suture.  The skin was closed using a 4-0 Vicryl  subcuticular suture.  Dermabond was then applied.   All tape and needle counts were correct at the end of the procedure.  The patient was transferred to postop in stable condition.   COMPLICATIONS:  None.   SPECIMEN:  Port-A-Cath.   BLOOD LOSS:  Minimal.      Dalia Heading, M.D.  Electronically Signed     MAJ/MEDQ  D:  07/27/2008  T:  07/27/2008  Job:  [id number]   cc:   Ladona Horns.  Mariel Sleet, MD  Fax: [phone]   Corrie Mckusick, M.D.  Fax: [phone]

## 2010-05-22 NOTE — Discharge Summary (Signed)
NAMENORAH, DEVIN NO.:  [id number]   MEDICAL RECORD NO.:  [id number]          PATIENT TYPE:  OBV   LOCATION:  A307                          FACILITY:  [facility]   PHYSICIAN:  Dalia Heading, M.D.  DATE OF BIRTH:  [birth year]   DATE OF ADMISSION:  02/06/2007  DATE OF DISCHARGE:  02/01/2009LH                               DISCHARGE SUMMARY   HOSPITAL COURSE:  The patient is a 70 year old black female who  presented for surgery on February 06, 2007.  She underwent a left  modified radical mastectomy for breast carcinoma.  She tolerated the  procedure well.  Postoperative course has been unremarkable.  Diet was  advanced without difficulty.  Final pathology is still pending.   The patient is being discharged home on February 08, 2007 in good,  improving condition.   DISCHARGE INSTRUCTIONS:  The patient is to follow up Dr. Franky Macho on  February 12, 2007.   DISCHARGE MEDICATIONS:  1. Darvocet N 100 one to two tablets p.o. q.6 h p.r.n. pain.  2. Dyazide 37.5/25 mg p.o. daily.  3. Zocor 40 mg p.o. daily.  4. Inderal 80 mg p.o. daily.  5. Lexapro 10 mg p.o. daily.  6. Levoxyl 50 mcg p.o. daily.  7. Xanax 0.5 mg p.o. daily.  8. Glucophage 1 gram p.o. daily   PRINCIPAL DIAGNOSIS:  1. Left breast carcinoma.  2. Anemia secondary to surgery.  3. Hypertension.  4. Hypothyroidism.  5. Depression.  6. Non-insulin-dependent diabetes mellitus.   PRINCIPAL PROCEDURE:  Left modified radical mastectomy on February 06, 2007.      Dalia Heading, M.D.  Electronically Signed     MAJ/MEDQ  D:  02/08/2007  T:  02/09/2007  Job:  [id number]   cc:   Corrie Mckusick, M.D.  Fax: [phone]

## 2010-05-22 NOTE — H&P (Signed)
NAME:  Casey Lopez, Casey Lopez             ACCOUNT NO.:  [id number]   MEDICAL RECORD NO.:  [id number]          PATIENT TYPE:  AMB   LOCATION:  DAY                           FACILITY:  [facility]   PHYSICIAN:  Dalia Heading, M.D.  DATE OF BIRTH:  03/30/1940   DATE OF ADMISSION:  DATE OF DISCHARGE:  LH                              HISTORY & PHYSICAL   AGE:  70 years old.   CHIEF COMPLAINT:  Left breast carcinoma, wound dehiscence.   HISTORY OF PRESENT ILLNESS:  Patient is a 70 year old black female who  underwent a left modified radical mastectomy in January, 2009.  She  subsequently developed an eschar and a superficial wound dehiscence  along the mid portion of the left mastectomy scar.  This has been  healing well by secondary intention.  She now comes to the operating  room for Port-A-Cath insertion and closure of the left mastectomy site.   PAST MEDICAL HISTORY:  Hypertension, hypothyroidism, depression.   PAST SURGICAL HISTORY:  As noted above, hemorrhoidectomy, EGD, right eye  cataract surgery.   CURRENT MEDICATIONS:  1. Dyazide/Inderal 1 tablet p.o. daily.  2. Synthroid 50 mcg p.o. daily.  3. Lexapro 10 mg p.o. daily.   ALLERGIES:  No known drug allergies.   REVIEW OF SYSTEMS:  Patient smokes tobacco.  She denies alcohol use.  She denies any other cardiopulmonary difficulties or bleeding disorders.   PHYSICAL EXAMINATION:  Patient is a well-developed and well-nourished  black female in no acute distress.  LUNGS:  Clear to auscultation with equal breath sounds bilaterally.  HEART:  Regular rate and rhythm without S3, S4, or murmurs.  CHEST:  The mid portion of a left mastectomy scar with dehiscence.  No  purulent drainage is noted.  Adipose tissue was exposed.   IMPRESSION:  Left breast carcinoma, need for central venous access,  wound dehiscence.   PLAN:  Patient is scheduled to undergo Port-A-Cath insertion as well as  closure of the left mastectomy wound on April 08, 2007.   The risks and  benefits of the procedure, including bleeding, infection, pneumothorax,  were fully explained to the patient, who gave informed consent.      Dalia Heading, M.D.  Electronically Signed     MAJ/MEDQ  D:  04/02/2007  T:  04/02/2007  Job:  [id number]   cc:   [facility]  Fax: [phone]   Ladona Horns. Mariel Sleet, MD  Fax: [phone]   Corrie Mckusick, M.D.  Fax: [phone]

## 2010-09-27 LAB — CBC
HCT: 33 — ABNORMAL LOW
Hemoglobin: 11.4 — ABNORMAL LOW
Hemoglobin: 14.7
MCHC: 34.5
MCHC: 34.6
MCV: 89.7
RBC: 4.73
RDW: 12.7
WBC: 6.8

## 2010-09-27 LAB — CROSSMATCH
ABO/RH(D): O POS
Antibody Screen: NEGATIVE

## 2010-09-27 LAB — COMPREHENSIVE METABOLIC PANEL
Alkaline Phosphatase: 76
BUN: 9
CO2: 30
Chloride: 100
Glucose, Bld: 98
Potassium: 3.5
Total Bilirubin: 0.6

## 2010-09-27 LAB — DIFFERENTIAL
Basophils Absolute: 0
Basophils Relative: 0
Eosinophils Absolute: 0.2
Eosinophils Relative: 2
Monocytes Absolute: 0.8
Neutro Abs: 5.4

## 2010-09-27 LAB — BASIC METABOLIC PANEL
CO2: 32
Calcium: 8.2 — ABNORMAL LOW
Glucose, Bld: 108 — ABNORMAL HIGH
Sodium: 138

## 2010-09-28 LAB — DIFFERENTIAL
Basophils Absolute: 0
Lymphocytes Relative: 26
Lymphs Abs: 2.5
Neutrophils Relative %: 63

## 2010-09-28 LAB — BASIC METABOLIC PANEL
BUN: 7
Calcium: 8.7
Creatinine, Ser: 0.58
GFR calc non Af Amer: >60

## 2010-09-28 LAB — CBC
Platelets: 148 — ABNORMAL LOW
WBC: 9.4

## 2010-10-02 LAB — CBC
HCT: 39.2
MCV: 88.9
RBC: 4.41
WBC: 5.7

## 2010-10-02 LAB — COMPREHENSIVE METABOLIC PANEL
AST: 17
Alkaline Phosphatase: 77
BUN: 10
CO2: 31
Chloride: 98
Creatinine, Ser: 0.64
GFR calc Af Amer: >60
GFR calc non Af Amer: >60
Potassium: 3.9
Total Bilirubin: 0.2 — ABNORMAL LOW

## 2010-10-02 LAB — DIFFERENTIAL
Basophils Absolute: 0
Basophils Relative: 1
Eosinophils Absolute: 0.3
Eosinophils Relative: 5
Lymphocytes Relative: 52 — ABNORMAL HIGH
Monocytes Absolute: 0.5

## 2010-10-03 LAB — COMPREHENSIVE METABOLIC PANEL
ALT: 18
AST: 16
Albumin: 3.6
Alkaline Phosphatase: 106
Chloride: 99
GFR calc Af Amer: >60
Potassium: 3.5
Sodium: 137
Total Bilirubin: 0.4
Total Protein: 7.1

## 2010-10-03 LAB — DIFFERENTIAL
Basophils Absolute: 0
Basophils Relative: 0
Eosinophils Relative: 1
Monocytes Absolute: 0.3
Monocytes Relative: 2 — ABNORMAL LOW
Neutro Abs: 12 — ABNORMAL HIGH

## 2010-10-03 LAB — CBC
HCT: 37.9
Platelets: 202
RDW: 13.5
WBC: 14.2 — ABNORMAL HIGH

## 2010-10-04 LAB — COMPREHENSIVE METABOLIC PANEL
ALT: 13
Albumin: 3.7
BUN: 7
Calcium: 9.6
Glucose, Bld: 109 — ABNORMAL HIGH
Potassium: 3.2 — ABNORMAL LOW
Sodium: 140
Total Protein: 7.1

## 2010-10-04 LAB — DIFFERENTIAL
Lymphs Abs: 1.4
Monocytes Absolute: 0.6
Monocytes Relative: 9
Neutro Abs: 4.5
Neutrophils Relative %: 68

## 2010-10-04 LAB — CBC
Hemoglobin: 11.9 — ABNORMAL LOW
MCHC: 35.6
Platelets: 213
RDW: 13.3

## 2010-10-08 LAB — COMPREHENSIVE METABOLIC PANEL
ALT: 15
ALT: 16
AST: 21
BUN: 15
CO2: 31
CO2: 32
Calcium: 9.9
Chloride: 97
Creatinine, Ser: 0.85
Creatinine, Ser: 0.75
GFR calc Af Amer: >60
GFR calc non Af Amer: >60
GFR calc non Af Amer: >60
Glucose, Bld: 105 — ABNORMAL HIGH
Glucose, Bld: 118 — ABNORMAL HIGH
Total Bilirubin: 0.4

## 2010-10-08 LAB — DIFFERENTIAL
Basophils Absolute: 0
Eosinophils Absolute: 0.3
Eosinophils Absolute: 0.3
Eosinophils Relative: 7 — ABNORMAL HIGH
Lymphocytes Relative: 43
Lymphocytes Relative: 38
Lymphs Abs: 1.7
Neutro Abs: 1.6 — ABNORMAL LOW
Neutrophils Relative %: 40 — ABNORMAL LOW

## 2010-10-08 LAB — CBC
Hemoglobin: 14
Hemoglobin: 14.5
MCHC: 34.2
MCV: 91.6
MCV: 90.1
RBC: 4.46
RBC: 4.67
RDW: 12.8
WBC: 4.5

## 2010-10-09 LAB — CBC
Platelets: 152 10*3/uL (ref 150–400)
WBC: 4.4 10*3/uL (ref 4.0–10.5)

## 2010-10-09 LAB — COMPREHENSIVE METABOLIC PANEL
AST: 18 U/L (ref 0–37)
Albumin: 3.4 g/dL — ABNORMAL LOW (ref 3.5–5.2)
Alkaline Phosphatase: 72 U/L (ref 39–117)
Chloride: 99 mEq/L (ref 96–112)
GFR calc Af Amer: >60 mL/min (ref >60)
Potassium: 3.4 mEq/L — ABNORMAL LOW (ref 3.5–5.1)
Total Bilirubin: 0.4 mg/dL (ref 0.3–1.2)

## 2010-10-09 LAB — DIFFERENTIAL
Basophils Absolute: 0 10*3/uL (ref 0.0–0.1)
Eosinophils Relative: 7 % — ABNORMAL HIGH (ref 0–5)
Lymphocytes Relative: 39 % (ref 12–46)
Monocytes Absolute: 0.4 10*3/uL (ref 0.1–1.0)

## 2010-10-10 LAB — LIPID PANEL
LDL Cholesterol: 79
Total CHOL/HDL Ratio: 2.9
Triglycerides: 100
VLDL: 20

## 2010-10-10 LAB — COMPREHENSIVE METABOLIC PANEL
Albumin: 3.6
BUN: 12
Calcium: 9.6
Creatinine, Ser: 0.81
Potassium: 3.2 — ABNORMAL LOW
Total Protein: 6

## 2010-10-10 LAB — DIFFERENTIAL
Lymphocytes Relative: 40
Lymphs Abs: 1.7
Monocytes Absolute: 0.4
Monocytes Relative: 10
Neutro Abs: 1.8

## 2010-10-10 LAB — CBC
HCT: 35.9 — ABNORMAL LOW
MCV: 92.7
Platelets: 160
RDW: 12.8

## 2010-10-12 LAB — COMPREHENSIVE METABOLIC PANEL
ALT: 17 U/L (ref 0–35)
Albumin: 3.5 g/dL (ref 3.5–5.2)
Alkaline Phosphatase: 76 U/L (ref 39–117)
BUN: 11 mg/dL (ref 6–23)
BUN: 12 mg/dL (ref 6–23)
CO2: 31 mEq/L (ref 19–32)
Calcium: 9.4 mg/dL (ref 8.4–10.5)
Calcium: 9.4 mg/dL (ref 8.4–10.5)
Creatinine, Ser: 0.76 mg/dL (ref 0.4–1.2)
GFR calc non Af Amer: >60 mL/min (ref >60)
Glucose, Bld: 127 mg/dL — ABNORMAL HIGH (ref 70–99)
Potassium: 3.6 mEq/L (ref 3.5–5.1)
Sodium: 138 mEq/L (ref 135–145)
Sodium: 139 mEq/L (ref 135–145)
Total Protein: 6.5 g/dL (ref 6.0–8.3)
Total Protein: 6.6 g/dL (ref 6.0–8.3)

## 2010-10-12 LAB — DIFFERENTIAL
Basophils Relative: 1 % (ref 0–1)
Eosinophils Absolute: 0.2 10*3/uL (ref 0.0–0.7)
Lymphocytes Relative: 37 % (ref 12–46)
Lymphs Abs: 1.7 10*3/uL (ref 0.7–4.0)
Lymphs Abs: 1.7 10*3/uL (ref 0.7–4.0)
Monocytes Absolute: 0.4 10*3/uL (ref 0.1–1.0)
Monocytes Relative: 10 % (ref 3–12)
Monocytes Relative: 9 % (ref 3–12)
Neutro Abs: 2 10*3/uL (ref 1.7–7.7)
Neutro Abs: 2.2 10*3/uL (ref 1.7–7.7)
Neutrophils Relative %: 47 % (ref 43–77)

## 2010-10-12 LAB — CBC
HCT: 39.1 % (ref 36.0–46.0)
Hemoglobin: 13.3 g/dL (ref 12.0–15.0)
MCHC: 33.6 g/dL (ref 30.0–36.0)
MCHC: 34 g/dL (ref 30.0–36.0)
MCV: 89.1 fL (ref 78.0–100.0)
Platelets: 134 10*3/uL — ABNORMAL LOW (ref 150–400)
RBC: 4.39 MIL/uL (ref 3.87–5.11)
RDW: 13.4 % (ref 11.5–15.5)
RDW: 13.4 % (ref 11.5–15.5)

## 2010-12-14 ENCOUNTER — Other Ambulatory Visit (HOSPITAL_COMMUNITY): Payer: Self-pay | Admitting: Oncology

## 2010-12-14 DIAGNOSIS — Z139 Encounter for screening, unspecified: Secondary | ICD-10-CM

## 2010-12-24 ENCOUNTER — Encounter (HOSPITAL_COMMUNITY): Payer: Medicare Other | Attending: Oncology | Admitting: Oncology

## 2010-12-24 ENCOUNTER — Encounter (HOSPITAL_COMMUNITY): Payer: Self-pay | Admitting: Oncology

## 2010-12-24 DIAGNOSIS — F3289 Other specified depressive episodes: Secondary | ICD-10-CM

## 2010-12-24 DIAGNOSIS — Z9010 Acquired absence of unspecified breast and nipple: Secondary | ICD-10-CM

## 2010-12-24 DIAGNOSIS — C50919 Malignant neoplasm of unspecified site of unspecified female breast: Secondary | ICD-10-CM

## 2010-12-24 DIAGNOSIS — E039 Hypothyroidism, unspecified: Secondary | ICD-10-CM

## 2010-12-24 DIAGNOSIS — C50319 Malignant neoplasm of lower-inner quadrant of unspecified female breast: Secondary | ICD-10-CM

## 2010-12-24 DIAGNOSIS — F32A Depression, unspecified: Secondary | ICD-10-CM

## 2010-12-24 DIAGNOSIS — I10 Essential (primary) hypertension: Secondary | ICD-10-CM

## 2010-12-24 DIAGNOSIS — F329 Major depressive disorder, single episode, unspecified: Secondary | ICD-10-CM

## 2010-12-24 DIAGNOSIS — E876 Hypokalemia: Secondary | ICD-10-CM

## 2010-12-24 HISTORY — DX: Hypothyroidism, unspecified: E03.9

## 2010-12-24 HISTORY — DX: Malignant neoplasm of unspecified site of unspecified female breast: C50.919

## 2010-12-24 HISTORY — DX: Depression, unspecified: F32.A

## 2010-12-24 HISTORY — DX: Essential (primary) hypertension: I10

## 2010-12-24 MED ORDER — POTASSIUM CHLORIDE CRYS ER 20 MEQ PO TBCR: 20.0000 meq | EXTENDED_RELEASE_TABLET | Freq: Every day | ORAL | Status: DC

## 2010-12-24 NOTE — Progress Notes (Signed)
Casey Ribas, MD [address] [place] [address]  1. Infiltrating ductal carcinoma of breast, stage 1    2. Depression    3. Hypothyroidism    4. HTN (hypertension)    5. Hypokalemia  potassium chloride SA (K-DUR,KLOR-CON) 20 MEQ tablet    CURRENT THERAPY:Status post left modified radical mastectomy on 02/06/2007 by Dr. Franky Macho. She participated in [facility] be protocol 04/08/1999, randomized to a.c. in a dose dense fashion for 4 cycles and treated with Herceptin for 52 weeks, finishing that as of 07/06/1998.   INTERVAL HISTORY: Casey Lopez 70 y.o. female returns for  regular  visit for followup of  Stage I (T1c N0 M0) infiltrating ductal carcinoma of the left breast, status post left modified radical mastectomy on 02/06/2007 by Dr. Franky Macho for a 1.9 cm cancer, grade 2 without LVI. ER receptors were 0, PR receptor were 0, HER-2/neu 3+ positive, tenderness or negative, and she participated in [facility] be protocol 04/08/1999, randomized to a.c. in a dose dense fashion for 4 cycles and treated with Herceptin for 52 weeks, finishing that as of 07/06/1998.  The patient denies any complaints. She mentions her late husband Casey Lopez who passed away number of years ago from metastatic prostate cancer. She is utilizing Lexapro and Xanax as needed to control her depression and anxiety. She notes that she did have an anxiety attack a number of weeks ago. Fortunately her Xanax controlled this issue. She has no residual problems since then.  The patient reports that she is very active with her church group. She will be donating her time during the holiday season to help those who are less fortunate.  The patient looks good and feels well. She denies any complaints. She does note that she needs a refill on her potassium. She reports that she will be mailing and her prescription. As result I physically gave her a paper prescription for this medication. She was reports  that she is getting low. As result I also called in this prescription to her pharmacy, [facility] for a 30 day supply.  Oncologically, the patient denies any complaints. She denies any headaches, dizziness, double vision, fevers, chills, night sweats, urinary complaints and bowel complaints.   Past Medical History  Diagnosis Date  . Infiltrating ductal carcinoma of breast, stage 1 12/24/2010  . Depression 12/24/2010  . Hypothyroidism 12/24/2010  . HTN (hypertension) 12/24/2010  . Goiter     has Infiltrating ductal carcinoma of breast, stage 1; Depression; Hypothyroidism; and HTN (hypertension) on her problem list.     is allergic to sulfa antibiotics and feldene.  Casey Lopez does not currently have medications on file.  Past Surgical History  Procedure Date  . Mastectomy 2009    left  . Appendectomy   . Abdominal hysterectomy   . Tonsilectomy, adenoidectomy, bilateral myringotomy and tubes   . Hemorrhoid surgery   . Cataract extraction     right eye with lens implant  . Biopsy thyroid   . Cholecystectomy     Denies any headaches, dizziness, double vision, fevers, chills, night sweats, nausea, vomiting, diarrhea, constipation, chest pain, heart palpitations, shortness of breath, blood in stool, black tarry stool, urinary pain, urinary burning, urinary frequency, hematuria.   PHYSICAL EXAMINATION  ECOG PERFORMANCE STATUS: 0 - Asymptomatic  Filed Vitals:   12/24/10 0914  BP: 157/76  Pulse: 87  Temp: 97 F (36.1 C)    GENERAL:alert, healthy, no distress, well nourished, well developed, comfortable,  cooperative, smiling and appears younger than stated age. SKIN: skin color, texture, turgor are normal HEAD: Normocephalic EYES: normal EARS: External ears normal OROPHARYNX:mucous membranes are moist  NECK: supple, trachea midline LYMPH:  no palpable lymphadenopathy, no hepatosplenomegaly BREAST:right breast normal without mass, skin or nipple changes or  axillary nodes, left post-mastectomy site well healed and free of suspicious changes LUNGS: clear to auscultation and percussion HEART: regular rate & rhythm, no murmurs, no gallops, S1 normal and S2 normal ABDOMEN:abdomen soft, non-tender, normal bowel sounds and no hepatosplenomegaly BACK: Back symmetric, no curvature., No CVA tenderness EXTREMITIES:less then 2 second capillary refill, no joint deformities, effusion, or inflammation, no edema, no skin discoloration, no clubbing, no cyanosis  NEURO: alert & oriented x 3 with fluent speech, no focal motor/sensory deficits, gait normal    ASSESSMENT:  1. Stage I infiltrating ductal carcinoma of the left breast status post mastectomy and chemotherapy. 2. Depression and anxiety secondary to passing away of her husband. 3. History of hypokalemia.   PLAN:  1. Called in Kdur 20 mEq #30 with 0 refills. Take one tablet, PO daily 2. Rx given for Kdur 20 mEq #30 with 4 refills.  Take on tablet PO daily.  This will be mailed in by the patient. 3. Will refill Xanax as needed. 4. Will contact PCP, Dr. Margo Aye, for lab work that was recently performed. 5. Patient will have mammogram performed on 01/03/11. 5. Return in 1 year for follow-up.   All questions were answered. The patient knows to call the clinic with any problems, questions or concerns. We can certainly see the patient much sooner if necessary.   Casey Lopez

## 2010-12-24 NOTE — Patient Instructions (Signed)
[facility]  Discharge Instructions  RECOMMENDATIONS MADE BY THE CONSULTANT AND ANY TEST RESULTS WILL BE SENT TO YOUR REFERRING DOCTOR.   EXAM FINDINGS BY MD TODAY AND SIGNS AND SYMPTOMS TO REPORT TO CLINIC OR PRIMARY MD: You are doing GREAT! Report any problems to [place] or your medical doctor that you may experience. Return to clinic as scheduled.    I acknowledge that I have been informed and understand all the instructions given to me and received a copy. I do not have any more questions at this time, but understand that I may call the Specialty [facility] at [facility] at [phone] during business hours should I have any further questions or need assistance in obtaining follow-up care.    __________________________________________  _____________  __________ Signature of Patient or Authorized Representative            Date                   Time    __________________________________________ Nurse's Signature

## 2011-01-03 ENCOUNTER — Ambulatory Visit (HOSPITAL_COMMUNITY)
Admission: RE | Admit: 2011-01-03 | Discharge: 2011-01-03 | Disposition: A | Discharge status: Home / Self Care | Payer: Medicare Other | Source: Ambulatory Visit | Attending: Oncology | Admitting: Oncology

## 2011-01-03 DIAGNOSIS — Z139 Encounter for screening, unspecified: Secondary | ICD-10-CM

## 2011-01-03 DIAGNOSIS — Z1231 Encounter for screening mammogram for malignant neoplasm of breast: Secondary | ICD-10-CM | POA: Insufficient documentation

## 2011-02-11 ENCOUNTER — Telehealth (HOSPITAL_COMMUNITY): Payer: Self-pay | Admitting: *Deleted

## 2011-02-11 NOTE — Telephone Encounter (Signed)
Message copied by Dennie Maizes on Mon Feb 11, 2011 11:42 AM ------      Message from: Mariel Sleet, ERIC S      Created: Mon Feb 11, 2011 11:19 AM       French Ana, find out when it was filled, if it was filled more than 45 days refill it X 2      ----- Message -----         From: Valentina Shaggy Almus Woodham, RN         Sent: 02/11/2011  10:23 AM           To: Randall An, MD            Salene called and asked if you would please refill her Xanax at [facility]. States she does not need nor take the Lexapro anymore and she only uses the Xanax about once a day. Filled #60 last time she had it filled.

## 2011-02-11 NOTE — Telephone Encounter (Signed)
Spoke with pharmacy. RX last filled 01/10/11. Spoke with Dr.Neijstrom, ok to call in 2 refills. Called to [facility] pharmacy this refill and 1 additional.

## 2011-03-22 ENCOUNTER — Other Ambulatory Visit (HOSPITAL_COMMUNITY): Payer: Self-pay | Admitting: Oncology

## 2011-03-22 DIAGNOSIS — F419 Anxiety disorder, unspecified: Secondary | ICD-10-CM

## 2011-03-22 MED ORDER — ALPRAZOLAM 0.5 MG PO TABS: ORAL_TABLET | ORAL | Status: DC

## 2011-04-08 ENCOUNTER — Encounter (HOSPITAL_COMMUNITY): Payer: Self-pay | Admitting: Pharmacy Technician

## 2011-04-11 ENCOUNTER — Encounter (HOSPITAL_COMMUNITY)
Admission: RE | Admit: 2011-04-11 | Discharge: 2011-04-11 | Disposition: A | Discharge status: Home / Self Care | Payer: Medicare Other | Source: Ambulatory Visit | Attending: Ophthalmology | Admitting: Ophthalmology

## 2011-04-11 ENCOUNTER — Encounter (HOSPITAL_COMMUNITY): Payer: Self-pay

## 2011-04-11 ENCOUNTER — Other Ambulatory Visit: Payer: Self-pay

## 2011-04-11 HISTORY — DX: Sickle-cell trait: D57.3

## 2011-04-11 HISTORY — DX: Malignant (primary) neoplasm, unspecified: C80.1

## 2011-04-11 LAB — BASIC METABOLIC PANEL
BUN: 11 mg/dL (ref 6–23)
Chloride: 101 mEq/L (ref 96–112)
GFR calc non Af Amer: 86 mL/min — ABNORMAL LOW (ref >90)
Glucose, Bld: 108 mg/dL — ABNORMAL HIGH (ref 70–99)
Potassium: 3.8 mEq/L (ref 3.5–5.1)
Sodium: 138 mEq/L (ref 135–145)

## 2011-04-11 LAB — HEMOGLOBIN AND HEMATOCRIT, BLOOD
HCT: 40.8 % (ref 36.0–46.0)
Hemoglobin: 13.6 g/dL (ref 12.0–15.0)

## 2011-04-11 NOTE — Patient Instructions (Addendum)
20 Casey Lopez  04/11/2011   Your procedure is scheduled on:  04/16/2011  Report to [facility] at  800  AM.  Call this number if you have problems the morning of surgery: [phone]   Remember:   Do not eat food:After Midnight.  May have clear liquids:until Midnight .  Clear liquids include soda, tea, black coffee, apple or grape juice, broth.  Take these medicines the morning of surgery with A SIP OF WATER: xanax,synthroid,lisinopril,propranolol   Do not wear jewelry, make-up or nail polish.  Do not wear lotions, powders, or perfumes. You may wear deodorant.  Do not shave 48 hours prior to surgery.  Do not bring valuables to the hospital.  Contacts, dentures or bridgework may not be worn into surgery.  Leave suitcase in the car. After surgery it may be brought to your room.  For patients admitted to the hospital, checkout time is 11:00 AM the day of discharge.   Patients discharged the day of surgery will not be allowed to drive home.  Name and phone number of your driver: family  Special Instructions: N/A   Please read over the following fact sheets that you were given: Pain Booklet, Surgical Site Infection Prevention, Anesthesia Post-op Instructions and Care and Recovery After Surgery Cataract A cataract is a clouding of the lens of the eye. When a lens becomes cloudy, vision is reduced based on the degree and nature of the clouding. Many cataracts reduce vision to some degree. Some cataracts make people more near-sighted as they develop. Other cataracts increase glare. Cataracts that are ignored and become worse can sometimes look white. The white color can be seen through the pupil. CAUSES   Aging. However, cataracts may occur at any age, even in newborns.   Certain drugs.   Trauma to the eye.   Certain diseases such as diabetes.   Specific eye diseases such as chronic inflammation inside the eye or a sudden attack of a rare form of glaucoma.   Inherited or acquired  medical problems.  SYMPTOMS   Gradual, progressive drop in vision in the affected eye.   Severe, rapid visual loss. This most often happens when trauma is the cause.  DIAGNOSIS  To detect a cataract, an eye doctor examines the lens. Cataracts are best diagnosed with an exam of the eyes with the pupils enlarged (dilated) by drops.  TREATMENT  For an early cataract, vision may improve by using different eyeglasses or stronger lighting. If that does not help your vision, surgery is the only effective treatment. A cataract needs to be surgically removed when vision loss interferes with your everyday activities, such as driving, reading, or watching TV. A cataract may also have to be removed if it prevents examination or treatment of another eye problem. Surgery removes the cloudy lens and usually replaces it with a substitute lens (intraocular lens, IOL).  At a time when both you and your doctor agree, the cataract will be surgically removed. If you have cataracts in both eyes, only one is usually removed at a time. This allows the operated eye to heal and be out of danger from any possible problems after surgery (such as infection or poor wound healing). In rare cases, a cataract may be doing damage to your eye. In these cases, your caregiver may advise surgical removal right away. The vast majority of people who have cataract surgery have better vision afterward. HOME CARE INSTRUCTIONS  If you are not planning surgery, you may be asked  to do the following:  Use different eyeglasses.   Use stronger or brighter lighting.   Ask your eye doctor about reducing your medicine dose or changing medicines if it is thought that a medicine caused your cataract. Changing medicines does not make the cataract go away on its own.   Become familiar with your surroundings. Poor vision can lead to injury. Avoid bumping into things on the affected side. You are at a higher risk for tripping or falling.   Exercise  extreme care when driving or operating machinery.   Wear sunglasses if you are sensitive to bright light or experiencing problems with glare.  SEEK IMMEDIATE MEDICAL CARE IF:   You have a worsening or sudden vision loss.   You notice redness, swelling, or increasing pain in the eye.   You have a fever.  Document Released: 12/24/2004 Document Revised: 12/13/2010 Document Reviewed: 08/17/2010 [facility] Patient Information 2012 [place], [place].PATIENT INSTRUCTIONS POST-ANESTHESIA  IMMEDIATELY FOLLOWING SURGERY:  Do not drive or operate machinery for the first twenty four hours after surgery.  Do not make any important decisions for twenty four hours after surgery or while taking narcotic pain medications or sedatives.  If you develop intractable nausea and vomiting or a severe headache please notify your doctor immediately.  FOLLOW-UP:  Please make an appointment with your surgeon as instructed. You do not need to follow up with anesthesia unless specifically instructed to do so.  WOUND CARE INSTRUCTIONS (if applicable):  Keep a dry clean dressing on the anesthesia/puncture wound site if there is drainage.  Once the wound has quit draining you may leave it open to air.  Generally you should leave the bandage intact for twenty four hours unless there is drainage.  If the epidural site drains for more than 36-48 hours please call the anesthesia department.  QUESTIONS?:  Please feel free to call your physician or the hospital operator if you have any questions, and they will be happy to assist you.     [facility] Anesthesia Department [address] [place] [phone]

## 2011-04-15 MED ORDER — FLURBIPROFEN SODIUM 0.03 % OP SOLN
OPHTHALMIC | Status: AC
  Administered 2011-04-16: 1 [drp] via OPHTHALMIC
  Filled 2011-04-15: qty 2.5

## 2011-04-15 MED ORDER — PHENYLEPHRINE HCL 2.5 % OP SOLN
OPHTHALMIC | Status: AC
  Administered 2011-04-16: 1 [drp] via OPHTHALMIC
  Filled 2011-04-15: qty 2

## 2011-04-15 MED ORDER — TETRACAINE HCL 0.5 % OP SOLN
OPHTHALMIC | Status: AC
  Administered 2011-04-16: 1 [drp] via OPHTHALMIC
  Filled 2011-04-15: qty 2

## 2011-04-15 MED ORDER — CYCLOPENTOLATE-PHENYLEPHRINE 0.2-1 % OP SOLN
OPHTHALMIC | Status: AC
  Administered 2011-04-16: 1 [drp] via OPHTHALMIC
  Filled 2011-04-15: qty 2

## 2011-04-16 ENCOUNTER — Encounter (HOSPITAL_COMMUNITY): Payer: Self-pay | Admitting: Anesthesiology

## 2011-04-16 ENCOUNTER — Ambulatory Visit (HOSPITAL_COMMUNITY)
Admission: RE | Admit: 2011-04-16 | Discharge: 2011-04-16 | Disposition: A | Discharge status: Home / Self Care | Payer: Medicare Other | Source: Ambulatory Visit | Attending: Ophthalmology | Admitting: Ophthalmology

## 2011-04-16 ENCOUNTER — Encounter (HOSPITAL_COMMUNITY): Payer: Self-pay

## 2011-04-16 ENCOUNTER — Ambulatory Visit (HOSPITAL_COMMUNITY): Payer: Medicare Other | Admitting: Anesthesiology

## 2011-04-16 ENCOUNTER — Encounter (HOSPITAL_COMMUNITY)
Admission: RE | Disposition: A | Discharge status: Home / Self Care | Payer: Self-pay | Source: Ambulatory Visit | Attending: Ophthalmology

## 2011-04-16 DIAGNOSIS — H2510 Age-related nuclear cataract, unspecified eye: Secondary | ICD-10-CM | POA: Insufficient documentation

## 2011-04-16 DIAGNOSIS — I10 Essential (primary) hypertension: Secondary | ICD-10-CM | POA: Insufficient documentation

## 2011-04-16 DIAGNOSIS — Z01812 Encounter for preprocedural laboratory examination: Secondary | ICD-10-CM | POA: Insufficient documentation

## 2011-04-16 DIAGNOSIS — Z79899 Other long term (current) drug therapy: Secondary | ICD-10-CM | POA: Insufficient documentation

## 2011-04-16 DIAGNOSIS — Z01810 Encounter for preprocedural cardiovascular examination: Secondary | ICD-10-CM | POA: Insufficient documentation

## 2011-04-16 HISTORY — PX: CATARACT EXTRACTION W/PHACO: SHX586

## 2011-04-16 SURGERY — PHACOEMULSIFICATION, CATARACT, WITH IOL INSERTION
Anesthesia: Monitor Anesthesia Care | Site: Eye | Laterality: Left | Wound class: Clean

## 2011-04-16 MED ORDER — ONDANSETRON HCL 4 MG/2ML IJ SOLN: 4.0000 mg | Freq: Once | INTRAMUSCULAR | Status: DC | PRN

## 2011-04-16 MED ORDER — TETRACAINE HCL 0.5 % OP SOLN
1.0000 [drp] | OPHTHALMIC | Status: AC
  Administered 2011-04-16 (×3): 1 [drp] via OPHTHALMIC

## 2011-04-16 MED ORDER — MIDAZOLAM HCL 2 MG/2ML IJ SOLN
1.0000 mg | INTRAMUSCULAR | Status: DC | PRN
  Administered 2011-04-16: 1 mg via INTRAVENOUS

## 2011-04-16 MED ORDER — LACTATED RINGERS IV SOLN
INTRAVENOUS | Status: DC
  Administered 2011-04-16: 09:00:00 via INTRAVENOUS

## 2011-04-16 MED ORDER — CYCLOPENTOLATE-PHENYLEPHRINE 0.2-1 % OP SOLN
1.0000 [drp] | OPHTHALMIC | Status: AC
  Administered 2011-04-16 (×3): 1 [drp] via OPHTHALMIC

## 2011-04-16 MED ORDER — FLURBIPROFEN SODIUM 0.03 % OP SOLN
1.0000 [drp] | OPHTHALMIC | Status: AC
  Administered 2011-04-16 (×3): 1 [drp] via OPHTHALMIC

## 2011-04-16 MED ORDER — PROVISC 10 MG/ML IO SOLN
INTRAOCULAR | Status: DC | PRN
  Administered 2011-04-16: 8.5 mg via INTRAOCULAR

## 2011-04-16 MED ORDER — FENTANYL CITRATE 0.05 MG/ML IJ SOLN: 25.0000 ug | INTRAMUSCULAR | Status: DC | PRN

## 2011-04-16 MED ORDER — BSS IO SOLN
INTRAOCULAR | Status: DC | PRN
  Administered 2011-04-16: 15 mL via INTRAOCULAR

## 2011-04-16 MED ORDER — EPINEPHRINE HCL 1 MG/ML IJ SOLN
INTRAOCULAR | Status: DC | PRN
  Administered 2011-04-16: 09:00:00

## 2011-04-16 MED ORDER — PHENYLEPHRINE HCL 2.5 % OP SOLN
1.0000 [drp] | OPHTHALMIC | Status: AC
  Administered 2011-04-16 (×3): 1 [drp] via OPHTHALMIC

## 2011-04-16 MED ORDER — EPINEPHRINE HCL 1 MG/ML IJ SOLN
INTRAMUSCULAR | Status: AC
  Filled 2011-04-16: qty 1

## 2011-04-16 MED ORDER — MIDAZOLAM HCL 2 MG/2ML IJ SOLN
INTRAMUSCULAR | Status: AC
  Administered 2011-04-16: 1 mg via INTRAVENOUS
  Filled 2011-04-16: qty 2

## 2011-04-16 SURGICAL SUPPLY — 24 items
CAPSULAR TENSION RING-AMO (OPHTHALMIC RELATED) IMPLANT
CLOTH BEACON ORANGE TIMEOUT ST (SAFETY) ×2 IMPLANT
EYE SHIELD UNIVERSAL CLEAR (GAUZE/BANDAGES/DRESSINGS) ×2 IMPLANT
GLOVE BIO SURGEON STRL SZ 6.5 (GLOVE) ×2 IMPLANT
GLOVE BIOGEL M 6.5 STRL (GLOVE) IMPLANT
GLOVE ECLIPSE 6.5 STRL STRAW (GLOVE) IMPLANT
GLOVE ECLIPSE 7.0 STRL STRAW (GLOVE) IMPLANT
GLOVE EXAM NITRILE LRG STRL (GLOVE) ×2 IMPLANT
GLOVE EXAM NITRILE MD LF STRL (GLOVE) IMPLANT
GLOVE SKINSENSE NS SZ6.5 (GLOVE)
GLOVE SKINSENSE STRL SZ6.5 (GLOVE) IMPLANT
HEALON 5 0.6 ML (INTRAOCULAR LENS) IMPLANT
KIT VITRECTOMY (OPHTHALMIC RELATED) IMPLANT
PAD ARMBOARD 7.5X6 YLW CONV (MISCELLANEOUS) ×2 IMPLANT
PROC W NO LENS (INTRAOCULAR LENS)
PROC W SPEC LENS (INTRAOCULAR LENS)
PROCESS W NO LENS (INTRAOCULAR LENS) IMPLANT
PROCESS W SPEC LENS (INTRAOCULAR LENS) IMPLANT
RING MALYGIN (MISCELLANEOUS) IMPLANT
SIGHTPATH CAT PROC W REG LENS (Ophthalmic Related) ×2 IMPLANT
TAPE SURG TRANSPORE 1 IN (GAUZE/BANDAGES/DRESSINGS) ×1 IMPLANT
TAPE SURGICAL TRANSPORE 1 IN (GAUZE/BANDAGES/DRESSINGS) ×1
VISCOELASTIC ADDITIONAL (OPHTHALMIC RELATED) IMPLANT
WATER STERILE IRR 250ML POUR (IV SOLUTION) ×2 IMPLANT

## 2011-04-16 NOTE — Anesthesia Postprocedure Evaluation (Signed)
  Anesthesia Post-op Note  Patient: Casey Lopez  Procedure(s) Performed: Procedure(s) (LRB): CATARACT EXTRACTION PHACO AND INTRAOCULAR LENS PLACEMENT (IOC) (Left)  Patient Location:  Short Stay  Anesthesia Type: MAC  Level of Consciousness: awake  Airway and Oxygen Therapy: Patient Spontanous Breathing  Post-op Pain: none  Post-op Assessment: Post-op Vital signs reviewed, Patient's Cardiovascular Status Stable, Respiratory Function Stable, Patent Airway, No signs of Nausea or vomiting and Pain level controlled  Post-op Vital Signs: Reviewed and stable  Complications: No apparent anesthesia complications

## 2011-04-16 NOTE — Discharge Instructions (Signed)
KC SEDLAK  04/16/2011           [facility] Instructions [address] [place] [address] [address]      1. Avoid closing eyes tightly. One often closes the eye tightly when laughing, talking, sneezing, coughing or if they feel irritated. At these times, you should be careful not to close your eyes tightly.  2. Instill one drop [place] in operative eye 3 times each day until the bottle is finished. To instill drops in your eye, open it, look up and have someone gently pull the lower lid down and instill a couple of drops inside the lower lid.  3. Do not touch upper lid.  4. Take Advil or Tylenol for pain.  5. You may use either eye for near work, such as reading or sewing and you may watch television.  6. You may have your hair done at the beauty parlor at any time.  7. Wear dark glasses with or without your own glasses if you are in bright light.  8. Call our office at [phone] or [phone] if you have sharp pain in your eye or unusual symptoms.  9. Do not be concerned because vision in the operative eye is not good. It will not be good, no matter how successful the operation, until you get a special lens for it. Your old glasses will not be suited to the new eye that was operated on and you will not be ready for a new lens for about a month.  10. Follow up at the [facility] office.    I have received a copy of the above instructions and will follow them.

## 2011-04-16 NOTE — Anesthesia Procedure Notes (Signed)
Procedure Name: MAC Date/Time: 04/16/2011 8:58 AM Performed by: Franco Nones Pre-anesthesia Checklist: Patient identified, Emergency Drugs available, Suction available, Timeout performed and Patient being monitored Patient Re-evaluated:Patient Re-evaluated prior to inductionOxygen Delivery Method: Nasal Cannula

## 2011-04-16 NOTE — Anesthesia Preprocedure Evaluation (Signed)
Anesthesia Evaluation  Patient identified by MRN, date of birth, ID band Patient awake    Reviewed: Allergy & Precautions, H&P , NPO status , Patient's Chart, lab work & pertinent test results  History of Anesthesia Complications Negative for: history of anesthetic complications  Airway Mallampati: II      Dental  (+) Teeth Intact   Pulmonary neg pulmonary ROS,  breath sounds clear to auscultation        Cardiovascular hypertension, Pt. on medications Rhythm:Regular     Neuro/Psych PSYCHIATRIC DISORDERS Anxiety Depression    GI/Hepatic   Endo/Other  Hypothyroidism   Renal/GU      Musculoskeletal   Abdominal   Peds  Hematology   Anesthesia Other Findings   Reproductive/Obstetrics                           Anesthesia Physical Anesthesia Plan  ASA: II  Anesthesia Plan: MAC   Post-op Pain Management:    Induction: Intravenous  Airway Management Planned: Nasal Cannula  Additional Equipment:   Intra-op Plan:   Post-operative Plan:   Informed Consent: I have reviewed the patients History and Physical, chart, labs and discussed the procedure including the risks, benefits and alternatives for the proposed anesthesia with the patient or authorized representative who has indicated his/her understanding and acceptance.     Plan Discussed with:   Anesthesia Plan Comments:         Anesthesia Quick Evaluation

## 2011-04-16 NOTE — Transfer of Care (Signed)
Immediate Anesthesia Transfer of Care Note  Patient: Casey Lopez  Procedure(s) Performed: Procedure(s) (LRB): CATARACT EXTRACTION PHACO AND INTRAOCULAR LENS PLACEMENT (IOC) (Left)  Patient Location: Shortstay  Anesthesia Type: MAC  Level of Consciousness: awake  Airway & Oxygen Therapy: Patient Spontanous Breathing   Post-op Assessment: Report given to PACU RN, Post -op Vital signs reviewed and stable and Patient moving all extremities  Post vital signs: Reviewed and stable  Complications: No apparent anesthesia complications

## 2011-04-16 NOTE — Op Note (Signed)
Patient brought to the operating room and prepped and draped in the usual manner.  Lid speculum inserted in right eye.  Stab incision made at the twelve o'clock position.  Provisc instilled in the anterior chamber.   A 2.4 mm. Stab incision was made temporally.  An anterior capsulotomy was done with a bent 25 gauge needle.  The nucleus was hydrodissected.  The Phaco tip was inserted in the anterior chamber and the nucleus was emulsified.  CDE was 7.66.  The cortical material was then removed with the I and A tip.  Posterior capsule was the polished.  The anterior chamber was deepened with Provisc.  A 24.0 Alcon SN60WF IOL was then inserted in the capsular bag.  Provisc was then removed with the I and A tip.  The wound was then hydrated.  Patient sent to the Recovery Room in good condition with follow up in my office.

## 2011-04-16 NOTE — H&P (Signed)
The patient was re examined and there is no change in the patients condition since the original H and P. 

## 2011-04-18 ENCOUNTER — Encounter (HOSPITAL_COMMUNITY): Payer: Self-pay | Admitting: Ophthalmology

## 2011-04-22 ENCOUNTER — Telehealth (HOSPITAL_COMMUNITY): Payer: Self-pay

## 2011-04-22 NOTE — Telephone Encounter (Signed)
Requests refill for xanax,

## 2011-04-23 ENCOUNTER — Encounter (HOSPITAL_COMMUNITY): Payer: Self-pay

## 2011-04-23 ENCOUNTER — Other Ambulatory Visit (HOSPITAL_COMMUNITY): Payer: Self-pay | Admitting: Oncology

## 2011-04-23 NOTE — Progress Notes (Signed)
Patient has refills available for xanax and does not need refill at this time.

## 2011-08-12 ENCOUNTER — Other Ambulatory Visit (HOSPITAL_COMMUNITY): Payer: Self-pay | Admitting: Oncology

## 2011-08-12 ENCOUNTER — Telehealth (HOSPITAL_COMMUNITY): Payer: Self-pay | Admitting: *Deleted

## 2011-08-12 DIAGNOSIS — F419 Anxiety disorder, unspecified: Secondary | ICD-10-CM

## 2011-08-12 MED ORDER — ALPRAZOLAM 0.5 MG PO TABS: ORAL_TABLET | ORAL | Status: DC

## 2011-08-12 NOTE — Telephone Encounter (Signed)
Needs refills on xanax uses [facility]. Called in per [facility]

## 2011-08-14 ENCOUNTER — Other Ambulatory Visit (HOSPITAL_COMMUNITY): Payer: Self-pay | Admitting: Oncology

## 2011-11-08 HISTORY — PX: DENTAL SURGERY: SHX609

## 2011-11-19 ENCOUNTER — Encounter (HOSPITAL_COMMUNITY): Payer: Self-pay | Admitting: Oncology

## 2011-11-19 ENCOUNTER — Other Ambulatory Visit (HOSPITAL_COMMUNITY): Payer: Self-pay | Admitting: Oncology

## 2011-11-19 DIAGNOSIS — F419 Anxiety disorder, unspecified: Secondary | ICD-10-CM

## 2011-11-19 MED ORDER — ALPRAZOLAM 0.5 MG PO TABS: ORAL_TABLET | ORAL | Status: DC

## 2011-11-28 ENCOUNTER — Other Ambulatory Visit (HOSPITAL_COMMUNITY): Payer: Self-pay | Admitting: Internal Medicine

## 2011-11-28 DIAGNOSIS — Z139 Encounter for screening, unspecified: Secondary | ICD-10-CM

## 2011-12-24 ENCOUNTER — Encounter (HOSPITAL_COMMUNITY): Payer: Self-pay | Admitting: Oncology

## 2011-12-24 ENCOUNTER — Encounter (HOSPITAL_COMMUNITY): Payer: Medicare Other | Attending: Oncology | Admitting: Oncology

## 2011-12-24 VITALS — BP 140/57 | HR 72 | Temp 97.4°F | Resp 16 | Wt 180.4 lb

## 2011-12-24 DIAGNOSIS — C50319 Malignant neoplasm of lower-inner quadrant of unspecified female breast: Secondary | ICD-10-CM

## 2011-12-24 DIAGNOSIS — C50919 Malignant neoplasm of unspecified site of unspecified female breast: Secondary | ICD-10-CM

## 2011-12-24 DIAGNOSIS — F341 Dysthymic disorder: Secondary | ICD-10-CM

## 2011-12-24 DIAGNOSIS — Z23 Encounter for immunization: Secondary | ICD-10-CM

## 2011-12-24 MED ORDER — INFLUENZA VIRUS VACC SPLIT PF IM SUSP
INTRAMUSCULAR | Status: AC
  Filled 2011-12-24: qty 0.5

## 2011-12-24 MED ORDER — INFLUENZA VIRUS VACC SPLIT PF IM SUSP
0.5000 mL | Freq: Once | INTRAMUSCULAR | Status: AC
  Administered 2011-12-24: 0.5 mL via INTRAMUSCULAR

## 2011-12-24 NOTE — Progress Notes (Signed)
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 She was randomized to Adriamycin and Cytoxan at a dose dense fashion for her cycles and treated with Herceptin for 52 weeks finishing therapy as of 07/06/1998. Her cancer was 1.9 cm in size, grade 2, no LV I was seen, ER receptor 0%, PR receptor 0%, HER-2/neu 3+ positive. She is status post left modified radical mastectomy on 02/06/2007. 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 Kristin Bruins several months ago. She doesn't have dental insurance but she states he took good care of her. She believes that her teeth are going bad because of the chemotherapy but she has bad teeth before you were started.  She still gets depressed over the loss of her best friend and husband. She is working a lot with her church to stay busy. She acts as an out reach person. Her oncology review of systems remains negative. Vital signs are stable other than her weight is up a few pounds compared to last year. She still has no lymphadenopathy. His a very prominent firm right lobe of her thyroid. The left is much smaller not is indurated. She states she had a biopsy this many years ago. This is being followed by primary care physician. Her lungs are clear. The remaining breast is negative. Chest wall is clear. She has a normal heart exam without murmur rub or gallop. Abdomen remains soft and nontender without organomegaly. She has no arm or leg edema. Her performance status remains 0.  We will see her back in 6 months. At that time we will do a CBC differential and cmet

## 2011-12-24 NOTE — Patient Instructions (Addendum)
[facility] Discharge Instructions  RECOMMENDATIONS MADE BY THE CONSULTANT AND ANY TEST RESULTS WILL BE SENT TO YOUR REFERRING PHYSICIAN.  EXAM FINDINGS BY THE PHYSICIAN TODAY AND SIGNS OR SYMPTOMS TO REPORT TO CLINIC OR PRIMARY PHYSICIAN: exam and discussion by MD.  No evidence of recurrence by exam.  Flu vaccine given today.  MEDICATIONS PRESCRIBED:  none  INSTRUCTIONS GIVEN AND DISCUSSED: Report any new lumps, bone pain or shortness of breath.  SPECIAL INSTRUCTIONS/FOLLOW-UP: Return 6 months for blood work then to see MD later.  Thank you for choosing [facility] to provide your oncology and hematology care.  To afford each patient quality time with our providers, please arrive at least 15 minutes before your scheduled appointment time.  With your help, our goal is to use those 15 minutes to complete the necessary work-up to ensure our physicians have the information they need to help with your evaluation and healthcare recommendations.    Effective January 1st, 2014, we ask that you re-schedule your appointment with our physicians should you arrive 10 or more minutes late for your appointment.  We strive to give you quality time with our providers, and arriving late affects you and other patients whose appointments are after yours.    Again, thank you for choosing [facility].  Our hope is that these requests will decrease the amount of time that you wait before being seen by our physicians.       _____________________________________________________________  I acknowledge that I have been informed and understand all the instructions given to me and received a copy. I do not have anymore questions at this time but understand that I may call the Cancer Center at [facility] at [phone] during business hours should I have any further questions or need assistance in obtaining follow-up care.    __________________________________________   _____________  __________ Signature of Patient or Authorized Representative            Date                   Time    __________________________________________ Nurse's Signature

## 2012-01-06 ENCOUNTER — Ambulatory Visit (HOSPITAL_COMMUNITY)
Admission: RE | Admit: 2012-01-06 | Discharge: 2012-01-06 | Disposition: A | Discharge status: Home / Self Care | Payer: Medicare Other | Source: Ambulatory Visit | Attending: Internal Medicine | Admitting: Internal Medicine

## 2012-01-06 DIAGNOSIS — Z139 Encounter for screening, unspecified: Secondary | ICD-10-CM

## 2012-01-06 DIAGNOSIS — Z1231 Encounter for screening mammogram for malignant neoplasm of breast: Secondary | ICD-10-CM | POA: Insufficient documentation

## 2012-02-24 ENCOUNTER — Other Ambulatory Visit (HOSPITAL_COMMUNITY): Payer: Self-pay | Admitting: Oncology

## 2012-02-24 DIAGNOSIS — F419 Anxiety disorder, unspecified: Secondary | ICD-10-CM

## 2012-02-24 MED ORDER — ALPRAZOLAM 0.5 MG PO TABS: ORAL_TABLET | ORAL | Status: DC

## 2012-06-12 ENCOUNTER — Other Ambulatory Visit (HOSPITAL_COMMUNITY): Payer: Self-pay | Admitting: Oncology

## 2012-06-12 DIAGNOSIS — F419 Anxiety disorder, unspecified: Secondary | ICD-10-CM

## 2012-06-12 MED ORDER — ALPRAZOLAM 0.5 MG PO TABS: ORAL_TABLET | ORAL | Status: DC

## 2012-06-23 ENCOUNTER — Encounter (HOSPITAL_COMMUNITY): Payer: Medicare Other | Attending: Oncology

## 2012-06-23 DIAGNOSIS — Z853 Personal history of malignant neoplasm of breast: Secondary | ICD-10-CM | POA: Insufficient documentation

## 2012-06-23 DIAGNOSIS — Z09 Encounter for follow-up examination after completed treatment for conditions other than malignant neoplasm: Secondary | ICD-10-CM | POA: Insufficient documentation

## 2012-06-23 DIAGNOSIS — C50919 Malignant neoplasm of unspecified site of unspecified female breast: Secondary | ICD-10-CM

## 2012-06-23 LAB — COMPREHENSIVE METABOLIC PANEL
ALT: 16 U/L (ref 0–35)
AST: 18 U/L (ref 0–37)
Alkaline Phosphatase: 75 U/L (ref 39–117)
CO2: 29 mEq/L (ref 19–32)
GFR calc Af Amer: 71 mL/min — ABNORMAL LOW (ref >90)
GFR calc non Af Amer: 62 mL/min — ABNORMAL LOW (ref >90)
Glucose, Bld: 123 mg/dL — ABNORMAL HIGH (ref 70–99)
Potassium: 3.7 mEq/L (ref 3.5–5.1)
Sodium: 140 mEq/L (ref 135–145)
Total Protein: 7.3 g/dL (ref 6.0–8.3)

## 2012-06-23 LAB — CBC WITH DIFFERENTIAL/PLATELET
Basophils Absolute: 0 10*3/uL (ref 0.0–0.1)
Lymphocytes Relative: 52 % — ABNORMAL HIGH (ref 12–46)
Lymphs Abs: 3.7 10*3/uL (ref 0.7–4.0)
Neutro Abs: 2.5 10*3/uL (ref 1.7–7.7)
Neutrophils Relative %: 36 % — ABNORMAL LOW (ref 43–77)
Platelets: 169 10*3/uL (ref 150–400)
RBC: 4.44 MIL/uL (ref 3.87–5.11)
RDW: 13.1 % (ref 11.5–15.5)
WBC: 7.1 10*3/uL (ref 4.0–10.5)

## 2012-06-23 NOTE — Progress Notes (Signed)
Labs drawn today for cbc/diff,cmp 

## 2012-06-25 NOTE — Progress Notes (Signed)
Catalina Pizza, MD  [address]  [place] [address]  Infiltrating ductal carcinoma of breast, stage 1, left  CURRENT THERAPY: Surveillance  INTERVAL HISTORY: Casey Lopez 72 y.o. 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  She was randomized to Adriamycin and Cytoxan at a dose dense fashion for her cycles and treated with Herceptin for 52 weeks finishing therapy as of 07/06/1998. Her cancer was 1.9 cm in size, grade 2, no LV I was seen, ER receptor 0%, PR receptor 0%, HER-2/neu 3+ positive. She is status post left modified radical mastectomy on 02/06/2007.  I personally reviewed and went over radiographic studies with the patient.  Last mammogram in December 2013 with a BI-RADS category 1. She'll be due for her next yearly screening mammogram in December 2014.  I personally reviewed and went over laboratory results with the patient.  Lab work from 06/23/2012 shows a white blood cell count of 7.1, hemoglobin 13.6, platelet count 169,000, with an unremarkable metabolic panel other than a BUN of 26 with a creatinine of 0.91 indicative of mild dehydration. She is encouraged increase by mouth fluids to counteract the dehydration.   The patient remains depressed from the loss of her husband which she talked about the majority of the time to her visit today. She reports that she is traveling quite a bit between [place] in [place] where her sister lives. She therefore she is to keeps her pretty busy when she is in [place] visiting.   She reports that nearly a daily basis, she finds herself tearful with regards to the loss of her husband.  She denies any oncology complaints and ROS questioning is negative.   Past Medical History  Diagnosis Date  . Infiltrating ductal carcinoma of breast, stage 1 12/24/2010  . Depression 12/24/2010  . Hypothyroidism 12/24/2010  . HTN  (hypertension) 12/24/2010  . Goiter   . Sickle cell trait   . Cancer     left breast    has Infiltrating ductal carcinoma of breast, stage 1; Depression; Hypothyroidism; and HTN (hypertension) on her problem list.     is allergic to sulfa antibiotics; feldene; and penicillins.  [name] does not currently have medications on file.  Past Surgical History  Procedure Laterality Date  . Mastectomy  2009    left  . Appendectomy    . Abdominal hysterectomy    . Tonsilectomy, adenoidectomy, bilateral myringotomy and tubes    . Hemorrhoid surgery    . Cataract extraction      right eye with lens implant  . Biopsy thyroid    . Cholecystectomy    . Cataract extraction  2008    right  . Colon resection    . Cataract extraction w/phaco  04/16/2011    Procedure: CATARACT EXTRACTION PHACO AND INTRAOCULAR LENS PLACEMENT (IOC);  Surgeon: Loraine Leriche T. Nile Riggs, MD;  Location: [facility];  Service: Ophthalmology;  Laterality: Left;  CDE=7.66  . Dental surgery  11/2011    Denies any headaches, dizziness, double vision, fevers, chills, night sweats, nausea, vomiting, diarrhea, constipation, chest pain, heart palpitations, shortness of breath, blood in stool, black tarry stool, urinary pain, urinary burning, urinary frequency, hematuria.   PHYSICAL EXAMINATION  ECOG PERFORMANCE STATUS: 0 - Asymptomatic  Filed Vitals:   06/26/12 1320  BP: 127/70  Pulse: 89  Resp: 16    GENERAL:alert, no distress, well nourished,  well developed, comfortable, cooperative and smiling SKIN: skin color, texture, turgor are normal, no rashes or significant lesions HEAD: Normocephalic, No masses, lesions, tenderness or abnormalities EYES: normal, PERRLA, EOMI, Conjunctiva are pink and non-injected EARS: External ears normal OROPHARYNX:mucous membranes are moist  NECK: supple, no adenopathy, thyroid normal size, non-tender, without nodularity, no stridor, non-tender, thyromegaly noted on right, trachea midline LYMPH:   no palpable lymphadenopathy, no hepatosplenomegaly BREAST:right breast normal without mass, skin or nipple changes or axillary nodes, left post-mastectomy site well healed and free of suspicious changes LUNGS: clear to auscultation and percussion HEART: regular rate & rhythm, no murmurs, no gallops, S1 normal and S2 normal ABDOMEN:abdomen soft, non-tender, normal bowel sounds, no masses or organomegaly and no hepatosplenomegaly BACK: Back symmetric, no curvature., No CVA tenderness EXTREMITIES:less then 2 second capillary refill, no joint deformities, effusion, or inflammation, no edema  NEURO: alert & oriented x 3 with fluent speech, no focal motor/sensory deficits, gait normal    LABORATORY DATA: CBC    Component Value Date/Time   WBC 7.1 06/23/2012 0924   RBC 4.44 06/23/2012 0924   HGB 13.6 06/23/2012 0924   HCT 39.6 06/23/2012 0924   PLT 169 06/23/2012 0924   MCV 89.2 06/23/2012 0924   MCH 30.6 06/23/2012 0924   MCHC 34.3 06/23/2012 0924   RDW 13.1 06/23/2012 0924   LYMPHSABS 3.7 06/23/2012 0924   MONOABS 0.5 06/23/2012 0924   EOSABS 0.4 06/23/2012 0924   BASOSABS 0.0 06/23/2012 0924      Chemistry      Component Value Date/Time   NA 140 06/23/2012 0924   K 3.7 06/23/2012 0924   CL 103 06/23/2012 0924   CO2 29 06/23/2012 0924   BUN 26* 06/23/2012 0924   CREATININE 0.91 06/23/2012 0924      Component Value Date/Time   CALCIUM 9.8 06/23/2012 0924   ALKPHOS 75 06/23/2012 0924   AST 18 06/23/2012 0924   ALT 16 06/23/2012 0924   BILITOT 0.3 06/23/2012 0924        RADIOGRAPHIC STUDIES:  11/28/2011  *RADIOLOGY REPORT*  Clinical Data: Screening.  RIGHT DIGITAL SCREENING MAMMOGRAM WITH CAD  Comparison: None.  FINDINGS:  ACR Breast Density Category 3: The breast tissue is heterogeneously  dense.  The patient has had a left mastectomy. No suspicious masses,  architectural distortion, or calcifications are present.  Images were processed with CAD.  IMPRESSION:  No evidence of  malignancy. Screening mammography is recommended in  one year.  BI-RADS CATEGORY 1: Negative.  Original Report Authenticated By: Rolla Plate, M.D.      ASSESSMENT:  1. Stage I infiltrating ductal carcinoma the breast status post treatment on CALGB protocol 56213.  She was randomized to Adriamycin and Cytoxan at a dose dense fashion for her cycles and treated with Herceptin for 52 weeks finishing therapy as of 07/06/1998. Her cancer was 1.9 cm in size, grade 2, no LV I was seen, ER receptor 0%, PR receptor 0%, HER-2/neu 3+ positive. She is status post left modified radical mastectomy on 02/06/2007. 2. Reactive depression secondary to the death of her husband Leonette Most 5 years ago from metastatic prostate cancer  3. Hypertension  4. Goiter with a dominant right side on therapy with thyroid replacement  5. Poor dental hygiene many years  Patient Active Problem List   Diagnosis Date Noted  . Infiltrating ductal carcinoma of breast, stage 1 12/24/2010  . Depression 12/24/2010  . Hypothyroidism 12/24/2010  . HTN (hypertension) 12/24/2010     PLAN:  1. I personally reviewed and went over laboratory results with the patient. 2. I personally reviewed and went over radiographic studies with the patient. 3. Next screening mammogram is due in December 2014 4. Return in 6 months for follow-up  THERAPY PLAN:  Laboratory work most recently was reviewed and is unimpressive. We'll continue to monitor the patient every 6 months. She was encouraged to continue with yearly screening mammograms as this is the best surveillance modality for her at this point time. She'll be due for next screening mammogram in December 2014  All questions were answered. The patient knows to call the clinic with any problems, questions or concerns. We can certainly see the patient much sooner if necessary.  Patient and plan will be discussed with Dr. Mariel Sleet within the next 24 hours.   Abbigayle Toole

## 2012-06-26 ENCOUNTER — Encounter (HOSPITAL_BASED_OUTPATIENT_CLINIC_OR_DEPARTMENT_OTHER): Payer: Medicare Other | Admitting: Oncology

## 2012-06-26 VITALS — BP 127/70 | HR 89 | Resp 16 | Wt 172.8 lb

## 2012-06-26 DIAGNOSIS — C50912 Malignant neoplasm of unspecified site of left female breast: Secondary | ICD-10-CM

## 2012-06-26 DIAGNOSIS — C50919 Malignant neoplasm of unspecified site of unspecified female breast: Secondary | ICD-10-CM

## 2012-06-26 NOTE — Patient Instructions (Addendum)
[facility] Discharge Instructions  RECOMMENDATIONS MADE BY THE CONSULTANT AND ANY TEST RESULTS WILL BE SENT TO YOUR REFERRING PHYSICIAN.  EXAM FINDINGS BY THE PHYSICIAN TODAY AND SIGNS OR SYMPTOMS TO REPORT TO CLINIC OR PRIMARY PHYSICIAN: Exam and discussion by Dellis Anes, PA-C.  You are doing well.  MEDICATIONS PRESCRIBED:  none  INSTRUCTIONS GIVEN AND DISCUSSED: Report any new lumps, bone pain, shortness of breath or other symptoms.  SPECIAL INSTRUCTIONS/FOLLOW-UP: Follow-up in 6 months.  Thank you for choosing [facility] to provide your oncology and hematology care.  To afford each patient quality time with our providers, please arrive at least 15 minutes before your scheduled appointment time.  With your help, our goal is to use those 15 minutes to complete the necessary work-up to ensure our physicians have the information they need to help with your evaluation and healthcare recommendations.    Effective January 1st, 2014, we ask that you re-schedule your appointment with our physicians should you arrive 10 or more minutes late for your appointment.  We strive to give you quality time with our providers, and arriving late affects you and other patients whose appointments are after yours.    Again, thank you for choosing [facility].  Our hope is that these requests will decrease the amount of time that you wait before being seen by our physicians.       _____________________________________________________________  Should you have questions after your visit to [facility], please contact our office at [phone] between the hours of 8:30 a.m. and 5:00 p.m.  Voicemails left after 4:30 p.m. will not be returned until the following business day.  For prescription refill requests, have your pharmacy contact our office with your prescription refill request.

## 2012-09-16 ENCOUNTER — Other Ambulatory Visit (HOSPITAL_COMMUNITY): Payer: Self-pay | Admitting: Oncology

## 2012-09-16 DIAGNOSIS — F419 Anxiety disorder, unspecified: Secondary | ICD-10-CM

## 2012-09-16 MED ORDER — ALPRAZOLAM 0.5 MG PO TABS: ORAL_TABLET | ORAL | Status: DC

## 2012-11-23 ENCOUNTER — Other Ambulatory Visit (HOSPITAL_COMMUNITY): Payer: Self-pay | Admitting: Oncology

## 2012-11-23 DIAGNOSIS — F419 Anxiety disorder, unspecified: Secondary | ICD-10-CM

## 2012-11-23 MED ORDER — ALPRAZOLAM 0.5 MG PO TABS: ORAL_TABLET | ORAL | Status: DC

## 2012-12-08 ENCOUNTER — Other Ambulatory Visit (HOSPITAL_COMMUNITY): Payer: Self-pay | Admitting: Internal Medicine

## 2012-12-08 DIAGNOSIS — Z139 Encounter for screening, unspecified: Secondary | ICD-10-CM

## 2012-12-25 ENCOUNTER — Encounter (HOSPITAL_COMMUNITY): Payer: Self-pay

## 2012-12-25 ENCOUNTER — Encounter (HOSPITAL_COMMUNITY): Payer: Medicare Other | Attending: Hematology and Oncology

## 2012-12-25 VITALS — BP 142/74 | HR 82 | Temp 96.3°F | Resp 16 | Wt 163.7 lb

## 2012-12-25 DIAGNOSIS — Z09 Encounter for follow-up examination after completed treatment for conditions other than malignant neoplasm: Secondary | ICD-10-CM | POA: Diagnosis present

## 2012-12-25 DIAGNOSIS — Z853 Personal history of malignant neoplasm of breast: Secondary | ICD-10-CM

## 2012-12-25 DIAGNOSIS — F411 Generalized anxiety disorder: Secondary | ICD-10-CM

## 2012-12-25 DIAGNOSIS — C50912 Malignant neoplasm of unspecified site of left female breast: Secondary | ICD-10-CM

## 2012-12-25 DIAGNOSIS — E039 Hypothyroidism, unspecified: Secondary | ICD-10-CM

## 2012-12-25 LAB — CBC WITH DIFFERENTIAL/PLATELET
Band Neutrophils: 0 % (ref 0–10)
Basophils Absolute: 0 10*3/uL (ref 0.0–0.1)
Basophils Relative: 0 % (ref 0–1)
Blasts: 0 %
HCT: 42.4 % (ref 36.0–46.0)
Hemoglobin: 14.2 g/dL (ref 12.0–15.0)
Lymphocytes Relative: 52 % — ABNORMAL HIGH (ref 12–46)
Lymphs Abs: 3.2 10*3/uL (ref 0.7–4.0)
MCH: 29.8 pg (ref 26.0–34.0)
MCHC: 33.5 g/dL (ref 30.0–36.0)
Myelocytes: 0 %
Promyelocytes Absolute: 0 %
RDW: 13.2 % (ref 11.5–15.5)

## 2012-12-25 LAB — COMPREHENSIVE METABOLIC PANEL
ALT: 14 U/L (ref 0–35)
AST: 17 U/L (ref 0–37)
Alkaline Phosphatase: 85 U/L (ref 39–117)
CO2: 26 mEq/L (ref 19–32)
Chloride: 100 mEq/L (ref 96–112)
GFR calc Af Amer: 74 mL/min — ABNORMAL LOW (ref >90)
GFR calc non Af Amer: 64 mL/min — ABNORMAL LOW (ref >90)
Glucose, Bld: 91 mg/dL (ref 70–99)
Potassium: 3.7 mEq/L (ref 3.5–5.1)
Sodium: 139 mEq/L (ref 135–145)
Total Bilirubin: 0.3 mg/dL (ref 0.3–1.2)

## 2012-12-25 NOTE — Patient Instructions (Signed)
[facility] Discharge Instructions  RECOMMENDATIONS MADE BY THE CONSULTANT AND ANY TEST RESULTS WILL BE SENT TO YOUR REFERRING PHYSICIAN.  Lab work today. We will call you if there are any abnormal results. Call us in January about your Xanax refill and we will call for authorization. Return to clinic in 6 months for lab work prior to MD visit.  Thank you for choosing [facility] to provide your oncology and hematology care.  To afford each patient quality time with our providers, please arrive at least 15 minutes before your scheduled appointment time.  With your help, our goal is to use those 15 minutes to complete the necessary work-up to ensure our physicians have the information they need to help with your evaluation and healthcare recommendations.    Effective January 1st, 2014, we ask that you re-schedule your appointment with our physicians should you arrive 10 or more minutes late for your appointment.  We strive to give you quality time with our providers, and arriving late affects you and other patients whose appointments are after yours.    Again, thank you for choosing [facility].  Our hope is that these requests will decrease the amount of time that you wait before being seen by our physicians.       _____________________________________________________________  Should you have questions after your visit to [facility], please contact our office at [phone] between the hours of 8:30 a.m. and 5:00 p.m.  Voicemails left after 4:30 p.m. will not be returned until the following business day.  For prescription refill requests, have your pharmacy contact our office with your prescription refill request.

## 2012-12-25 NOTE — Progress Notes (Signed)
Casey Lopez presented for labwork. Labs per MD order drawn via Peripheral Line 23 gauge needle inserted in right antecubital.  Good blood return present. Procedure without incident.  Needle removed intact. Patient tolerated procedure well.

## 2012-12-25 NOTE — Progress Notes (Signed)
[facility]  OFFICE PROGRESS NOTE  [place], [facility], MD  [address] [place] [address]  DIAGNOSIS: Infiltrating ductal carcinoma of breast, stage 1, left - Plan: CBC with Differential, Comprehensive metabolic panel, CEA, Cancer antigen 27.29  Hypothyroidism - Plan: TSH  Chief Complaint  Patient presents with  . Breast Cancer  . Hypothyroidism    CURRENT THERAPY: Watchful expectation  INTERVAL HISTORY: Casey Lopez 72 y.o. female returns for followup of stage I duct cell carcinoma of the left breast, status post left modified radical mastectomy for ER negative, PR negative, HER-2/neu overexpressed breast cancer with primary tumor 1.9 cm in size, treated per CAL[facility] protocol 4-1-1 consisting of doxorubicin and Cytoxan for 4 cycles followed by Herceptin out to 52 weeks. Treatment was concluded on 07/05/2008 . She is concerned because her third-party pair is no longer going to cover alprazolam which has worked well for her due anxiety after her husband's death in May 30, 2006. Appetite now is good with no nausea, vomiting, PND, orthopnea, palpitations, lymphedema, lower extremity swelling or redness, diarrhea, constipation, melena, hematochezia, hematuria, skin rash, joint pain, headache, or seizures.  MEDICAL HISTORY: Past Medical History  Diagnosis Date  . Infiltrating ductal carcinoma of breast, stage 1 12/24/2010  . Depression 12/24/2010  . Hypothyroidism 12/24/2010  . HTN (hypertension) 12/24/2010  . Goiter   . Sickle cell trait   . Cancer     left breast    INTERIM HISTORY: has Infiltrating ductal carcinoma of breast, stage 1; Depression; Hypothyroidism; and HTN (hypertension) on her problem list.   Stage I infiltrating ductal carcinoma the breast status post treatment on CALGB protocol 19147.  She was randomized to Adriamycin and Cytoxan at a dose dense fashion for 4 cycles and treated with Herceptin for 52 weeks finishing therapy as of  07/06/1998. Her cancer was 1.9 cm in size, grade 2, no LV I was seen, ER receptor 0%, PR receptor 0%, HER-2/neu 3+ positive. She is status post left modified radical mastectomy on 02/06/2007.  ALLERGIES:  is allergic to sulfa antibiotics; feldene; and penicillins.  MEDICATIONS: has a current medication list which includes the following prescription(s): alprazolam, k-dur, levothyroxine, lisinopril, OVER THE COUNTER MEDICATION, propranolol, and simvastatin.  SURGICAL HISTORY:  Past Surgical History  Procedure Laterality Date  . Mastectomy  05/30/07    left  . Appendectomy    . Abdominal hysterectomy    . Tonsilectomy, adenoidectomy, bilateral myringotomy and tubes    . Hemorrhoid surgery    . Cataract extraction      right eye with lens implant  . Biopsy thyroid    . Cholecystectomy    . Cataract extraction  2006/05/30    right  . Colon resection    . Cataract extraction w/phaco  04/16/2011    Procedure: CATARACT EXTRACTION PHACO AND INTRAOCULAR LENS PLACEMENT (IOC);  Surgeon: Loraine Leriche T. Nile Riggs, MD;  Location: [facility];  Service: Ophthalmology;  Laterality: Left;  CDE=7.66  . Dental surgery  11/2011    FAMILY HISTORY: family history is negative for Anesthesia problems, Hypotension, Malignant hyperthermia, and Pseudochol deficiency.  SOCIAL HISTORY:  reports that she has never smoked. She has never used smokeless tobacco. She reports that she does not drink alcohol or use illicit drugs.  REVIEW OF SYSTEMS:  Other than that discussed above is noncontributory.  PHYSICAL EXAMINATION: ECOG PERFORMANCE STATUS: 0 - Asymptomatic  Blood pressure 142/74, pulse 82, temperature 96.3 F (35.7 C), temperature source  Oral, resp. rate 16, weight 163 lb 11.2 oz (74.254 kg).  GENERAL:alert, no distress and comfortable SKIN: skin color, texture, turgor are normal, no rashes or significant lesions EYES: PERLA; Conjunctiva are pink and non-injected, sclera clear OROPHARYNX:no exudate, no erythema on lips, buccal  mucosa, or tongue. NECK: supple, thyroid normal size, non-tender, without nodularity. No masses CHEST: Status post left mastectomy with no subcutaneous nodules. LYMPH:  no palpable lymphadenopathy in the cervical, axillary or inguinal LUNGS: clear to auscultation and percussion with normal breathing effort HEART: regular rate & rhythm and no murmurs. ABDOMEN:abdomen soft, non-tender and normal bowel sounds MUSCULOSKELETAL:no cyanosis of digits and no clubbing. Range of motion normal.  NEURO: alert & oriented x 3 with fluent speech, no focal motor/sensory deficits   LABORATORY DATA: No visits with results within 30 Day(s) from this visit. Latest known visit with results is:  Infusion on 06/23/2012  Component Date Value Range Status  . WBC 06/23/2012 7.1  4.0 - 10.5 K/uL Final  . RBC 06/23/2012 4.44  3.87 - 5.11 MIL/uL Final  . Hemoglobin 06/23/2012 13.6  12.0 - 15.0 g/dL Final  . HCT 16/10/9602 39.6  36.0 - 46.0 % Final  . MCV 06/23/2012 89.2  78.0 - 100.0 fL Final  . MCH 06/23/2012 30.6  26.0 - 34.0 pg Final  . MCHC 06/23/2012 34.3  30.0 - 36.0 g/dL Final  . RDW 54/09/8117 13.1  11.5 - 15.5 % Final  . Platelets 06/23/2012 169  150 - 400 K/uL Final  . Neutrophils Relative % 06/23/2012 36* 43 - 77 % Final  . Neutro Abs 06/23/2012 2.5  1.7 - 7.7 K/uL Final  . Lymphocytes Relative 06/23/2012 52* 12 - 46 % Final  . Lymphs Abs 06/23/2012 3.7  0.7 - 4.0 K/uL Final  . Monocytes Relative 06/23/2012 7  3 - 12 % Final  . Monocytes Absolute 06/23/2012 0.5  0.1 - 1.0 K/uL Final  . Eosinophils Relative 06/23/2012 5  0 - 5 % Final  . Eosinophils Absolute 06/23/2012 0.4  0.0 - 0.7 K/uL Final  . Basophils Relative 06/23/2012 0  0 - 1 % Final  . Basophils Absolute 06/23/2012 0.0  0.0 - 0.1 K/uL Final  . Sodium 06/23/2012 140  135 - 145 mEq/L Final  . Potassium 06/23/2012 3.7  3.5 - 5.1 mEq/L Final  . Chloride 06/23/2012 103  96 - 112 mEq/L Final  . CO2 06/23/2012 29  19 - 32 mEq/L Final  .  Glucose, Bld 06/23/2012 123* 70 - 99 mg/dL Final  . BUN 14/78/2956 26* 6 - 23 mg/dL Final  . Creatinine, Ser 06/23/2012 0.91  0.50 - 1.10 mg/dL Final  . Calcium 21/30/8657 9.8  8.4 - 10.5 mg/dL Final  . Total Protein 06/23/2012 7.3  6.0 - 8.3 g/dL Final  . Albumin 84/69/6295 3.8  3.5 - 5.2 g/dL Final  . AST 28/41/3244 18  0 - 37 U/L Final  . ALT 06/23/2012 16  0 - 35 U/L Final  . Alkaline Phosphatase 06/23/2012 75  39 - 117 U/L Final  . Total Bilirubin 06/23/2012 0.3  0.3 - 1.2 mg/dL Final  . GFR calc non Af Amer 06/23/2012 62* >90 mL/min Final  . GFR calc Af Amer 06/23/2012 71* >90 mL/min Final   Comment:                                 The eGFR has been calculated  using the CKD EPI equation.                          This calculation has not been                          validated in all clinical                          situations.                          eGFR's persistently                          <90 mL/min signify                          possible Chronic Kidney Disease.    PATHOLOGY: No new pathology.  Urinalysis No results found for this basename: colorurine, appearanceur, labspec, phurine, glucoseu, hgbur, bilirubinur, ketonesur, proteinur, urobilinogen, nitrite, leukocytesur    RADIOGRAPHIC STUDIES: Mammogram scheduled for 02/07/2015S2   ASSESSMENT:  #1. Stage I infiltrating ductal carcinoma the breast status post treatment on CALGB protocol 45409. She was randomized to Adriamycin and Cytoxan at a dose dense fashion for 4 cycles and treated with Herceptin for 52 weeks finishing therapy as of 07/06/1998. Her cancer was 1.9 cm in size, grade 2, no LV I was seen, ER receptor 0%, PR receptor 0%, HER-2/neu 3+ positive. She is status post left modified radical mastectomy on 02/06/2007. Currently she remains without evidence of disease pending today's lab reports. #2. Anxiety neurosis well-controlled with Xanax with poor tolerance of alternative  benzodiazepines tested in the past. #3. Hypothyroidism, on treatment. #4. Hypertension, controlled. #5. Sickle cell trait.    PLAN:  #1. Continue Xanax 0.5 mg twice a day with plans to obtain prior authorization after 01/07/2013 [facility] for the patient to maintain herself on this agent which has worked extremely well for her controlling anxiety and depression. #2. Mammogram in 02-13-2013. #3. Followup in 6 months with lab tests. Patient was encouraged to call should any symptoms occur that are troublesome and persistent.   All questions were answered. The patient knows to call the clinic with any problems, questions or concerns. We can certainly see the patient much sooner if necessary.   I spent 25 minutes counseling the patient face to face. The total time spent in the appointment was 30 minutes.    Maurilio Lovely, MD 12/25/2012 2:01 PM

## 2012-12-26 LAB — TSH: TSH: 0.74 u[IU]/mL (ref 0.350–4.500)

## 2013-01-04 ENCOUNTER — Telehealth (HOSPITAL_COMMUNITY): Payer: Self-pay | Admitting: *Deleted

## 2013-01-04 NOTE — Telephone Encounter (Signed)
Message copied by Dennie Maizes on Mon Jan 04, 2013  4:20 PM ------      Message from: Alla German A      Created: Mon Jan 04, 2013  3:02 PM       Labs are excellent except for borderline platelets result of which is insignificant cllinically and probably reflective of previous chemo.      ----- Message -----         From: Valentina Shaggy Law Corsino, RN         Sent: 01/04/2013  11:31 AM           To: Alla German, MD            Would you please review her labs from last week and make comments. She has called asking for results.       ------

## 2013-01-04 NOTE — Telephone Encounter (Signed)
Message left on answering machine as per Dr.Formanek's comments. Instruct pt to call clinic with any further questions.

## 2013-01-11 ENCOUNTER — Ambulatory Visit (HOSPITAL_COMMUNITY)
Admission: RE | Admit: 2013-01-11 | Discharge: 2013-01-11 | Disposition: A | Discharge status: Home / Self Care | Payer: Medicare Other | Source: Ambulatory Visit | Attending: Internal Medicine | Admitting: Internal Medicine

## 2013-01-11 DIAGNOSIS — Z139 Encounter for screening, unspecified: Secondary | ICD-10-CM

## 2013-01-11 DIAGNOSIS — Z1231 Encounter for screening mammogram for malignant neoplasm of breast: Secondary | ICD-10-CM | POA: Insufficient documentation

## 2013-02-24 ENCOUNTER — Telehealth (HOSPITAL_COMMUNITY): Payer: Self-pay | Admitting: Oncology

## 2013-02-24 ENCOUNTER — Other Ambulatory Visit (HOSPITAL_COMMUNITY): Payer: Self-pay | Admitting: Oncology

## 2013-02-24 DIAGNOSIS — F419 Anxiety disorder, unspecified: Secondary | ICD-10-CM

## 2013-02-24 MED ORDER — ALPRAZOLAM 0.5 MG PO TABS: 0.2500 mg | ORAL_TABLET | Freq: Two times a day (BID) | ORAL | Status: DC | PRN

## 2013-06-22 ENCOUNTER — Encounter (HOSPITAL_COMMUNITY): Payer: Medicare Other | Attending: Hematology and Oncology

## 2013-06-22 DIAGNOSIS — C50912 Malignant neoplasm of unspecified site of left female breast: Secondary | ICD-10-CM

## 2013-06-22 DIAGNOSIS — Z853 Personal history of malignant neoplasm of breast: Secondary | ICD-10-CM | POA: Insufficient documentation

## 2013-06-22 DIAGNOSIS — Z9221 Personal history of antineoplastic chemotherapy: Secondary | ICD-10-CM | POA: Insufficient documentation

## 2013-06-22 DIAGNOSIS — D573 Sickle-cell trait: Secondary | ICD-10-CM | POA: Insufficient documentation

## 2013-06-22 DIAGNOSIS — F3289 Other specified depressive episodes: Secondary | ICD-10-CM | POA: Insufficient documentation

## 2013-06-22 DIAGNOSIS — I10 Essential (primary) hypertension: Secondary | ICD-10-CM | POA: Diagnosis not present

## 2013-06-22 DIAGNOSIS — Z9089 Acquired absence of other organs: Secondary | ICD-10-CM | POA: Diagnosis not present

## 2013-06-22 DIAGNOSIS — E039 Hypothyroidism, unspecified: Secondary | ICD-10-CM

## 2013-06-22 DIAGNOSIS — F329 Major depressive disorder, single episode, unspecified: Secondary | ICD-10-CM | POA: Insufficient documentation

## 2013-06-22 DIAGNOSIS — F411 Generalized anxiety disorder: Secondary | ICD-10-CM | POA: Insufficient documentation

## 2013-06-22 DIAGNOSIS — Z9010 Acquired absence of unspecified breast and nipple: Secondary | ICD-10-CM | POA: Insufficient documentation

## 2013-06-22 DIAGNOSIS — C50919 Malignant neoplasm of unspecified site of unspecified female breast: Secondary | ICD-10-CM

## 2013-06-22 LAB — COMPREHENSIVE METABOLIC PANEL
ALBUMIN: 3.7 g/dL (ref 3.5–5.2)
ALT: 13 U/L (ref 0–35)
AST: 18 U/L (ref 0–37)
Alkaline Phosphatase: 86 U/L (ref 39–117)
BUN: 20 mg/dL (ref 6–23)
CO2: 27 mEq/L (ref 19–32)
CREATININE: 0.88 mg/dL (ref 0.50–1.10)
Calcium: 9.6 mg/dL (ref 8.4–10.5)
Chloride: 103 mEq/L (ref 96–112)
GFR calc non Af Amer: 64 mL/min — ABNORMAL LOW (ref >90)
GFR, EST AFRICAN AMERICAN: 74 mL/min — AB (ref >90)
GLUCOSE: 109 mg/dL — AB (ref 70–99)
Potassium: 4 mEq/L (ref 3.7–5.3)
Sodium: 141 mEq/L (ref 137–147)
TOTAL PROTEIN: 7.3 g/dL (ref 6.0–8.3)
Total Bilirubin: 0.2 mg/dL — ABNORMAL LOW (ref 0.3–1.2)

## 2013-06-22 LAB — CBC WITH DIFFERENTIAL/PLATELET
BASOS PCT: 0 % (ref 0–1)
Basophils Absolute: 0 10*3/uL (ref 0.0–0.1)
EOS ABS: 0.4 10*3/uL (ref 0.0–0.7)
EOS PCT: 6 % — AB (ref 0–5)
HCT: 39.6 % (ref 36.0–46.0)
HEMOGLOBIN: 13.7 g/dL (ref 12.0–15.0)
LYMPHS ABS: 3.3 10*3/uL (ref 0.7–4.0)
Lymphocytes Relative: 52 % — ABNORMAL HIGH (ref 12–46)
MCH: 31 pg (ref 26.0–34.0)
MCHC: 34.6 g/dL (ref 30.0–36.0)
MCV: 89.6 fL (ref 78.0–100.0)
MONO ABS: 0.5 10*3/uL (ref 0.1–1.0)
MONOS PCT: 7 % (ref 3–12)
Neutro Abs: 2.2 10*3/uL (ref 1.7–7.7)
Neutrophils Relative %: 35 % — ABNORMAL LOW (ref 43–77)
Platelets: 162 10*3/uL (ref 150–400)
RBC: 4.42 MIL/uL (ref 3.87–5.11)
RDW: 13 % (ref 11.5–15.5)
WBC: 6.3 10*3/uL (ref 4.0–10.5)

## 2013-06-22 NOTE — Progress Notes (Signed)
Casey Lopez presented for labwork. Labs per MD order drawn via Peripheral Line 23 gauge needle inserted in right AC  Good blood return present. Procedure without incident.  Needle removed intact. Patient tolerated procedure well.

## 2013-06-23 LAB — CANCER ANTIGEN 27.29: CA 27.29: 29 U/mL (ref 0–39)

## 2013-06-23 LAB — CEA: CEA: 6.7 ng/mL — ABNORMAL HIGH (ref 0.0–5.0)

## 2013-06-23 LAB — TSH: TSH: 0.689 u[IU]/mL (ref 0.350–4.500)

## 2013-06-25 ENCOUNTER — Encounter (HOSPITAL_COMMUNITY): Payer: Self-pay

## 2013-06-25 ENCOUNTER — Encounter (HOSPITAL_BASED_OUTPATIENT_CLINIC_OR_DEPARTMENT_OTHER): Payer: Medicare Other

## 2013-06-25 VITALS — BP 118/55 | HR 75 | Temp 97.2°F | Resp 20 | Wt 157.7 lb

## 2013-06-25 DIAGNOSIS — F419 Anxiety disorder, unspecified: Secondary | ICD-10-CM

## 2013-06-25 DIAGNOSIS — C50912 Malignant neoplasm of unspecified site of left female breast: Secondary | ICD-10-CM

## 2013-06-25 DIAGNOSIS — E038 Other specified hypothyroidism: Secondary | ICD-10-CM

## 2013-06-25 DIAGNOSIS — F411 Generalized anxiety disorder: Secondary | ICD-10-CM

## 2013-06-25 DIAGNOSIS — Z853 Personal history of malignant neoplasm of breast: Secondary | ICD-10-CM

## 2013-06-25 MED ORDER — ALPRAZOLAM 0.5 MG PO TABS: 0.2500 mg | ORAL_TABLET | Freq: Two times a day (BID) | ORAL | Status: DC | PRN

## 2013-06-25 NOTE — Progress Notes (Signed)
[facility]  OFFICE PROGRESS NOTE  [place], [name], MD  [place] [address]  DIAGNOSIS: Infiltrating ductal carcinoma of breast, stage 1, left - Plan: cetirizine (ZYRTEC) 10 MG tablet, ALPRAZolam (XANAX) 0.5 MG tablet, CBC with Differential, Comprehensive metabolic panel, Cancer antigen 27.29, CEA, TSH  Other specified hypothyroidism - Plan: cetirizine (ZYRTEC) 10 MG tablet, ALPRAZolam (XANAX) 0.5 MG tablet, CBC with Differential, Comprehensive metabolic panel, Cancer antigen 27.29, CEA, TSH  Anxiety - Plan: cetirizine (ZYRTEC) 10 MG tablet, ALPRAZolam (XANAX) 0.5 MG tablet, CBC with Differential, Comprehensive metabolic panel, Cancer antigen 27.29, CEA, TSH  Chief Complaint  Patient presents with  . Breast cancer, HER-2 positive    CURRENT THERAPY: Watchful expectation.  INTERVAL HISTORY: Casey Lopez 73 y.o. female returns for followup of stage I duct cell carcinoma the left breast, status post left modified radical mastectomy for ER negative, PR negative, HER-2/neu overexpressed breast cancer her primary tumor 1.9 cm, treatment with CALGB protocol 40101 consisting of doxorubicin and Cytoxan for 4 cycles followed by Herceptin out to 52 weeks with treatment concluded on 07/05/2008. She did not receive any radiotherapy.  Appetite has been good with no nausea, vomiting, diarrhea, constipation, dysuria, hematuria, incontinence, vaginal dryness or bleeding, lower extremity swelling or redness, lymphedema, skin rash, joint pain, headache, or seizures.  MEDICAL HISTORY: Past Medical History  Diagnosis Date  . Infiltrating ductal carcinoma of breast, stage 1 12/24/2010  . Depression 12/24/2010  . Hypothyroidism 12/24/2010  . HTN (hypertension) 12/24/2010  . Goiter   . Sickle cell trait   . Cancer     left breast    INTERIM HISTORY: has Infiltrating ductal carcinoma of breast, stage 1; Depression; Hypothyroidism; and HTN  (hypertension) on her problem list.   Stage I infiltrating ductal carcinoma the breast status post treatment on CALGB protocol 40101.  She was randomized to Adriamycin and Cytoxan at a dose dense fashion for 4 cycles and treated with Herceptin for 52 weeks finishing therapy as of 07/06/1998. Her cancer was 1.9 cm in size, grade 2, no LV I was seen, ER receptor 0%, PR receptor 0%, HER-2/neu 3+ positive. She is status post left modified radical mastectomy on 02/06/2007.     ALLERGIES:  is allergic to sulfa antibiotics; feldene; and penicillins.  MEDICATIONS: has a current medication list which includes the following prescription(s): alprazolam, cetirizine, levothyroxine, lisinopril, propranolol, simvastatin, and k-dur.  SURGICAL HISTORY:  Past Surgical History  Procedure Laterality Date  . Mastectomy  2009    left  . Appendectomy    . Abdominal hysterectomy    . Tonsilectomy, adenoidectomy, bilateral myringotomy and tubes    . Hemorrhoid surgery    . Cataract extraction      right eye with lens implant  . Biopsy thyroid    . Cholecystectomy    . Cataract extraction  2008    right  . Colon resection    . Cataract extraction w/phaco  04/16/2011    Procedure: CATARACT EXTRACTION PHACO AND INTRAOCULAR LENS PLACEMENT (IOC);  Surgeon: Elta Guadeloupe T. Gershon Crane, MD;  Location: [facility];  Service: Ophthalmology;  Laterality: Left;  CDE=7.66  . Dental surgery  11/2011    FAMILY HISTORY: family history is negative for Anesthesia problems, Hypotension, Malignant hyperthermia, and Pseudochol deficiency.  SOCIAL HISTORY:  reports that she has never smoked. She has never used smokeless tobacco. She reports that she does not drink alcohol or use illicit drugs.  REVIEW OF SYSTEMS:  Other than that discussed above is noncontributory.  PHYSICAL EXAMINATION: ECOG PERFORMANCE STATUS: 0 - Asymptomatic  Blood pressure 118/55, pulse 75, temperature 97.2 F (36.2 C), temperature source Oral, resp. rate 20, weight  157 lb 11.2 oz (71.532 kg).  GENERAL:alert, no distress and comfortable SKIN: skin color, texture, turgor are normal, no rashes or significant lesions EYES: PERLA; Conjunctiva are pink and non-injected, sclera clear SINUSES: No redness or tenderness over maxillary or ethmoid sinuses OROPHARYNX:no exudate, no erythema on lips, buccal mucosa, or tongue. NECK: supple, thyroid normal size, non-tender, without nodularity. No masses CHEST: Status post left mastectomy with no subcutaneous nodules. Right breast without mass. LYMPH:  no palpable lymphadenopathy in the cervical, axillary or inguinal LUNGS: clear to auscultation and percussion with normal breathing effort HEART: regular rate & rhythm and no murmurs. ABDOMEN:abdomen soft, non-tender and normal bowel sounds MUSCULOSKELETAL:no cyanosis of digits and no clubbing. Range of motion normal.  NEURO: alert & oriented x 3 with fluent speech, no focal motor/sensory deficits   LABORATORY DATA: Lab on 06/22/2013  Component Date Value Ref Range Status  . WBC 06/22/2013 6.3  4.0 - 10.5 K/uL Final  . RBC 06/22/2013 4.42  3.87 - 5.11 MIL/uL Final  . Hemoglobin 06/22/2013 13.7  12.0 - 15.0 g/dL Final  . HCT 06/22/2013 39.6  36.0 - 46.0 % Final  . MCV 06/22/2013 89.6  78.0 - 100.0 fL Final  . MCH 06/22/2013 31.0  26.0 - 34.0 pg Final  . MCHC 06/22/2013 34.6  30.0 - 36.0 g/dL Final  . RDW 06/22/2013 13.0  11.5 - 15.5 % Final  . Platelets 06/22/2013 162  150 - 400 K/uL Final  . Neutrophils Relative % 06/22/2013 35* 43 - 77 % Final  . Neutro Abs 06/22/2013 2.2  1.7 - 7.7 K/uL Final  . Lymphocytes Relative 06/22/2013 52* 12 - 46 % Final  . Lymphs Abs 06/22/2013 3.3  0.7 - 4.0 K/uL Final  . Monocytes Relative 06/22/2013 7  3 - 12 % Final  . Monocytes Absolute 06/22/2013 0.5  0.1 - 1.0 K/uL Final  . Eosinophils Relative 06/22/2013 6* 0 - 5 % Final  . Eosinophils Absolute 06/22/2013 0.4  0.0 - 0.7 K/uL Final  . Basophils Relative 06/22/2013 0  0 - 1  % Final  . Basophils Absolute 06/22/2013 0.0  0.0 - 0.1 K/uL Final  . Sodium 06/22/2013 141  137 - 147 mEq/L Final  . Potassium 06/22/2013 4.0  3.7 - 5.3 mEq/L Final  . Chloride 06/22/2013 103  96 - 112 mEq/L Final  . CO2 06/22/2013 27  19 - 32 mEq/L Final  . Glucose, Bld 06/22/2013 109* 70 - 99 mg/dL Final  . BUN 06/22/2013 20  6 - 23 mg/dL Final  . Creatinine, Ser 06/22/2013 0.88  0.50 - 1.10 mg/dL Final  . Calcium 06/22/2013 9.6  8.4 - 10.5 mg/dL Final  . Total Protein 06/22/2013 7.3  6.0 - 8.3 g/dL Final  . Albumin 06/22/2013 3.7  3.5 - 5.2 g/dL Final  . AST 06/22/2013 18  0 - 37 U/L Final  . ALT 06/22/2013 13  0 - 35 U/L Final  . Alkaline Phosphatase 06/22/2013 86  39 - 117 U/L Final  . Total Bilirubin 06/22/2013 0.2* 0.3 - 1.2 mg/dL Final  . GFR calc non Af Amer 06/22/2013 64* >90 mL/min Final  . GFR calc Af Amer 06/22/2013 74* >90 mL/min Final   Comment: (NOTE)  The eGFR has been calculated using the CKD EPI equation.                          This calculation has not been validated in all clinical situations.                          eGFR's persistently <90 mL/min signify possible Chronic Kidney                          Disease.  . CEA 06/22/2013 6.7* 0.0 - 5.0 ng/mL Final   Performed at [facility]  . TSH 06/22/2013 0.689  0.350 - 4.500 uIU/mL Final   Performed at [facility]  . CA 27.29 06/22/2013 29  0 - 39 U/mL Final   Performed at [facility]    PATHOLOGY: No new pathology.  Urinalysis No results found for this basename: colorurine,  appearanceur,  labspec,  phurine,  glucoseu,  hgbur,  bilirubinur,  ketonesur,  proteinur,  urobilinogen,  nitrite,  leukocytesur    RADIOGRAPHIC STUDIES: MM Digital Screening Unilat R Status: Final result            Study Result    CLINICAL DATA: Screening.  EXAM:  DIGITAL SCREENING UNILATERAL RIGHT MAMMOGRAM WITH CAD  COMPARISON: Prior studies dating back to 12/26/2008    ACR Breast Density Category c: The breast tissue is heterogeneously  dense, which may obscure small masses.  FINDINGS:  There are no findings suspicious for malignancy. Images were  processed with CAD.  IMPRESSION:  No mammographic evidence of malignancy. A result letter of this  screening mammogram will be mailed directly to the patient.  RECOMMENDATION:  Screening mammogram in one year. (Code:SM-B-01Y)  BI-RADS CATEGORY 1: Negative  Electronically Signed  By: Cain Saupe M.D.  On: 01/12/2013 13     ASSESSMENT:  #1. Stage I infiltrating ductal carcinoma the breast status post treatment on CALGB protocol 81402. She was randomized to Adriamycin and Cytoxan at a dose dense fashion for 4 cycles and treated with Herceptin for 52 weeks finishing therapy as of 07/06/1998. Her cancer was 1.9 cm in size, grade 2, no LV I was seen, ER receptor 0%, PR receptor 0%, HER-2/neu 3+ positive. She is status post left modified radical mastectomy on 02/06/2007. Currently she remains without evidence of disease pending today's lab reports.  #2. Anxiety neurosis well-controlled with Xanax with poor tolerance of alternative benzodiazepines tested in the past.  #3. Hypothyroidism, on treatment.  #4. Hypertension, controlled.  #5. Sickle cell trait.       PLAN:  #1. Continue Xanax 0.5 mg twice a day with plans to obtain prior authorization after 01/07/2013 [facility] for the patient to maintain herself on this agent which has worked extremely well for her controlling anxiety and depression.  #2. Mammogram in January 2015.  #3. Followup in 6 months with lab tests. Patient was encouraged to call should any symptoms occur that are troublesome and persistent.       All questions were answered. The patient knows to call the clinic with any problems, questions or concerns. We can certainly see the patient much sooner if necessary.   I spent 25 minutes counseling the patient face to face. The total time  spent in the appointment was 30 minutes.    Maurilio Lovely, MD 06/25/2013 2:47 PM  DISCLAIMER:  This note was  dictated with voice recognition software.  Similar sounding words can inadvertently be transcribed inaccurately and may not be corrected upon review.

## 2013-06-25 NOTE — Patient Instructions (Addendum)
[facility] Discharge Instructions  RECOMMENDATIONS MADE BY THE CONSULTANT AND ANY TEST RESULTS WILL BE SENT TO YOUR REFERRING PHYSICIAN.  EXAM FINDINGS BY THE PHYSICIAN TODAY AND SIGNS OR SYMPTOMS TO REPORT TO CLINIC OR PRIMARY PHYSICIAN:   Labs in 1 year: June 16 @ 10am  Return in 1 year June 17 @ 1:30pm   Thank you for choosing [facility] to provide your oncology and hematology care.  To afford each patient quality time with our providers, please arrive at least 15 minutes before your scheduled appointment time.  With your help, our goal is to use those 15 minutes to complete the necessary work-up to ensure our physicians have the information they need to help with your evaluation and healthcare recommendations.    Effective January 1st, 2014, we ask that you re-schedule your appointment with our physicians should you arrive 10 or more minutes late for your appointment.  We strive to give you quality time with our providers, and arriving late affects you and other patients whose appointments are after yours.    Again, thank you for choosing [facility].  Our hope is that these requests will decrease the amount of time that you wait before being seen by our physicians.       _____________________________________________________________  Should you have questions after your visit to [facility], please contact our office at [phone] between the hours of 8:30 a.m. and 5:00 p.m.  Voicemails left after 4:30 p.m. will not be returned until the following business day.  For prescription refill requests, have your pharmacy contact our office with your prescription refill request.

## 2013-10-25 ENCOUNTER — Other Ambulatory Visit (HOSPITAL_COMMUNITY): Payer: Self-pay | Admitting: Oncology

## 2013-10-25 DIAGNOSIS — F419 Anxiety disorder, unspecified: Secondary | ICD-10-CM

## 2013-10-25 DIAGNOSIS — C50912 Malignant neoplasm of unspecified site of left female breast: Secondary | ICD-10-CM

## 2013-10-25 DIAGNOSIS — E038 Other specified hypothyroidism: Secondary | ICD-10-CM

## 2013-10-25 MED ORDER — ALPRAZOLAM 0.5 MG PO TABS: 0.2500 mg | ORAL_TABLET | Freq: Two times a day (BID) | ORAL | Status: DC | PRN

## 2013-12-13 ENCOUNTER — Other Ambulatory Visit (HOSPITAL_COMMUNITY): Payer: Self-pay | Admitting: Internal Medicine

## 2013-12-13 DIAGNOSIS — Z1231 Encounter for screening mammogram for malignant neoplasm of breast: Secondary | ICD-10-CM

## 2014-01-13 ENCOUNTER — Ambulatory Visit (HOSPITAL_COMMUNITY)
Admission: RE | Admit: 2014-01-13 | Discharge: 2014-01-13 | Disposition: A | Discharge status: Home / Self Care | Payer: Medicare Other | Source: Ambulatory Visit | Attending: Internal Medicine | Admitting: Internal Medicine

## 2014-01-13 DIAGNOSIS — Z1231 Encounter for screening mammogram for malignant neoplasm of breast: Secondary | ICD-10-CM | POA: Insufficient documentation

## 2014-01-13 DIAGNOSIS — Z9012 Acquired absence of left breast and nipple: Secondary | ICD-10-CM | POA: Diagnosis not present

## 2014-01-13 DIAGNOSIS — Z853 Personal history of malignant neoplasm of breast: Secondary | ICD-10-CM | POA: Insufficient documentation

## 2014-02-03 ENCOUNTER — Other Ambulatory Visit (HOSPITAL_COMMUNITY): Payer: Self-pay | Admitting: Oncology

## 2014-02-03 DIAGNOSIS — F419 Anxiety disorder, unspecified: Secondary | ICD-10-CM

## 2014-02-03 DIAGNOSIS — E038 Other specified hypothyroidism: Secondary | ICD-10-CM

## 2014-02-03 DIAGNOSIS — C50912 Malignant neoplasm of unspecified site of left female breast: Secondary | ICD-10-CM

## 2014-02-03 MED ORDER — ALPRAZOLAM 0.5 MG PO TABS: 0.2500 mg | ORAL_TABLET | Freq: Two times a day (BID) | ORAL | Status: DC | PRN

## 2014-06-23 ENCOUNTER — Encounter (HOSPITAL_COMMUNITY): Payer: Medicare Other | Attending: Hematology & Oncology

## 2014-06-23 DIAGNOSIS — C50912 Malignant neoplasm of unspecified site of left female breast: Secondary | ICD-10-CM | POA: Diagnosis present

## 2014-06-23 DIAGNOSIS — E038 Other specified hypothyroidism: Secondary | ICD-10-CM | POA: Insufficient documentation

## 2014-06-23 DIAGNOSIS — F419 Anxiety disorder, unspecified: Secondary | ICD-10-CM | POA: Insufficient documentation

## 2014-06-23 LAB — CBC WITH DIFFERENTIAL/PLATELET
BASOS ABS: 0.1 10*3/uL (ref 0.0–0.1)
Basophils Relative: 1 % (ref 0–1)
EOS PCT: 6 % — AB (ref 0–5)
Eosinophils Absolute: 0.3 10*3/uL (ref 0.0–0.7)
HCT: 41.9 % (ref 36.0–46.0)
Hemoglobin: 13.9 g/dL (ref 12.0–15.0)
Lymphocytes Relative: 56 % — ABNORMAL HIGH (ref 12–46)
Lymphs Abs: 3.3 10*3/uL (ref 0.7–4.0)
MCH: 30.2 pg (ref 26.0–34.0)
MCHC: 33.2 g/dL (ref 30.0–36.0)
MCV: 90.9 fL (ref 78.0–100.0)
Monocytes Absolute: 0.3 10*3/uL (ref 0.1–1.0)
Monocytes Relative: 6 % (ref 3–12)
NEUTROS ABS: 1.8 10*3/uL (ref 1.7–7.7)
Neutrophils Relative %: 31 % — ABNORMAL LOW (ref 43–77)
PLATELETS: 155 10*3/uL (ref 150–400)
RBC: 4.61 MIL/uL (ref 3.87–5.11)
RDW: 13.1 % (ref 11.5–15.5)
WBC: 5.8 10*3/uL (ref 4.0–10.5)

## 2014-06-23 LAB — COMPREHENSIVE METABOLIC PANEL
ALT: 17 U/L (ref 14–54)
AST: 23 U/L (ref 15–41)
Albumin: 4.1 g/dL (ref 3.5–5.0)
Alkaline Phosphatase: 71 U/L (ref 38–126)
Anion gap: 9 (ref 5–15)
BILIRUBIN TOTAL: 0.7 mg/dL (ref 0.3–1.2)
BUN: 16 mg/dL (ref 6–20)
CHLORIDE: 102 mmol/L (ref 101–111)
CO2: 29 mmol/L (ref 22–32)
Calcium: 9.3 mg/dL (ref 8.9–10.3)
Creatinine, Ser: 0.92 mg/dL (ref 0.44–1.00)
GFR calc Af Amer: >60 mL/min (ref >60)
GFR calc non Af Amer: 60 mL/min — ABNORMAL LOW (ref >60)
Glucose, Bld: 100 mg/dL — ABNORMAL HIGH (ref 65–99)
Potassium: 3.7 mmol/L (ref 3.5–5.1)
SODIUM: 140 mmol/L (ref 135–145)
Total Protein: 7.5 g/dL (ref 6.5–8.1)

## 2014-06-23 LAB — TSH: TSH: 0.803 u[IU]/mL (ref 0.350–4.500)

## 2014-06-24 ENCOUNTER — Ambulatory Visit (HOSPITAL_COMMUNITY): Payer: Medicare Other | Admitting: Hematology & Oncology

## 2014-06-24 ENCOUNTER — Encounter (HOSPITAL_BASED_OUTPATIENT_CLINIC_OR_DEPARTMENT_OTHER): Payer: Medicare Other | Admitting: Hematology & Oncology

## 2014-06-24 VITALS — BP 123/57 | HR 69 | Temp 98.1°F | Resp 16 | Wt 159.1 lb

## 2014-06-24 DIAGNOSIS — I10 Essential (primary) hypertension: Secondary | ICD-10-CM | POA: Diagnosis not present

## 2014-06-24 DIAGNOSIS — E049 Nontoxic goiter, unspecified: Secondary | ICD-10-CM

## 2014-06-24 DIAGNOSIS — Z853 Personal history of malignant neoplasm of breast: Secondary | ICD-10-CM

## 2014-06-24 DIAGNOSIS — C50912 Malignant neoplasm of unspecified site of left female breast: Secondary | ICD-10-CM

## 2014-06-24 DIAGNOSIS — E038 Other specified hypothyroidism: Secondary | ICD-10-CM

## 2014-06-24 DIAGNOSIS — F419 Anxiety disorder, unspecified: Secondary | ICD-10-CM

## 2014-06-24 DIAGNOSIS — F329 Major depressive disorder, single episode, unspecified: Secondary | ICD-10-CM | POA: Diagnosis not present

## 2014-06-24 LAB — CANCER ANTIGEN 27.29: CA 27.29: 36.4 U/mL (ref 0.0–38.6)

## 2014-06-24 LAB — CEA: CEA: 11.8 ng/mL — AB (ref 0.0–4.7)

## 2014-06-24 MED ORDER — ALPRAZOLAM 0.5 MG PO TABS: 0.2500 mg | ORAL_TABLET | Freq: Two times a day (BID) | ORAL | Status: DC | PRN

## 2014-06-24 NOTE — Patient Instructions (Signed)
..  [place] at [facility] Discharge Instructions  RECOMMENDATIONS MADE BY THE CONSULTANT AND ANY TEST RESULTS WILL BE SENT TO YOUR REFERRING PHYSICIAN.  You will return to the clinic in 6 months with labs in 3 months.  You will also be referred to Dr. Oneida Alar for a colonoscopy.     Thank you for choosing [facility] at [facility] to provide your oncology and hematology care.  To afford each patient quality time with our provider, please arrive at least 15 minutes before your scheduled appointment time.    You need to re-schedule your appointment should you arrive 10 or more minutes late.  We strive to give you quality time with our providers, and arriving late affects you and other patients whose appointments are after yours.  Also, if you no show three or more times for appointments you may be dismissed from the clinic at the providers discretion.     Again, thank you for choosing [facility].  Our hope is that these requests will decrease the amount of time that you wait before being seen by our physicians.       _____________________________________________________________  Should you have questions after your visit to [facility], please contact our office at [phone] between the hours of 8:30 a.m. and 4:30 p.m.  Voicemails left after 4:30 p.m. will not be returned until the following business day.  For prescription refill requests, have your pharmacy contact our office.

## 2014-06-24 NOTE — Progress Notes (Signed)
Lab draw

## 2014-06-24 NOTE — Progress Notes (Signed)
Casey Cahill, MD  [place] [address]  Infiltrating ductal carcinoma of breast, stage 1, left - Plan: lisinopril-hydrochlorothiazide (PRINZIDE,ZESTORETIC) 20-12.5 MG per tablet, propranolol ER (INDERAL LA) 80 MG 24 hr capsule, CEA, CBC with Differential, Comprehensive metabolic panel, ALPRAZolam (XANAX) 0.5 MG tablet  Anxiety - Plan: ALPRAZolam (XANAX) 0.5 MG tablet  Other specified hypothyroidism - Plan: ALPRAZolam (XANAX) 0.5 MG tablet  CURRENT THERAPY: Surveillance  INTERVAL HISTORY: Casey Lopez 74 y.o. 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   She was randomized to Adriamycin and Cytoxan at a dose dense fashion and treated with Herceptin for 52 weeks finishing therapy as of 07/06/1998. Her cancer was 1.9 cm in size, grade 2, no LV I was seen, ER receptor 0%, PR receptor 0%, HER-2/neu 3+ positive. She is status post left modified radical mastectomy on 02/06/2007.   She denies any oncology complaints and ROS questioning is negative.   She feels great today and is here alone. Her appetite and sleeping have been well. Her son brings her food, and she has gained 8 lbs. Her weight loss over the holidays she attributes to religious fasting. Dr. Nevada Crane is her primary care physician. She hasn't had any problems with her previous breast cancer. The only problem she had was when her husband died, he passed away 8 years ago of cancer. She still takes Xanax occasionally when she is overwhelmed about his loss. She is very involved with her church. She is currently writing a book about herself. She has had her uterus removed. She self checks her breasts. Her last colonoscopy was 8-10 years ago.   Past Medical History  Diagnosis Date  . Infiltrating ductal carcinoma of breast, stage 1 12/24/2010  . Depression 12/24/2010  . Hypothyroidism 12/24/2010  . HTN  (hypertension) 12/24/2010  . Goiter   . Sickle cell trait   . Cancer     left breast    has Infiltrating ductal carcinoma of breast, stage 1; Depression; Hypothyroidism; and HTN (hypertension) on her problem list.     is allergic to sulfa antibiotics; feldene; and penicillins.  Ms. Riches had no medications administered during this visit.  Past Surgical History  Procedure Laterality Date  . Mastectomy  2009    left  . Appendectomy    . Abdominal hysterectomy    . Tonsilectomy, adenoidectomy, bilateral myringotomy and tubes    . Hemorrhoid surgery    . Cataract extraction      right eye with lens implant  . Biopsy thyroid    . Cholecystectomy    . Cataract extraction  2008    right  . Colon resection    . Cataract extraction w/phaco  04/16/2011    Procedure: CATARACT EXTRACTION PHACO AND INTRAOCULAR LENS PLACEMENT (IOC);  Surgeon: Elta Guadeloupe T. Gershon Crane, MD;  Location: [facility];  Service: Ophthalmology;  Laterality: Left;  CDE=7.66  . Dental surgery  11/2011  Widowed for 8 years. Her husband died of cancer while she was also going through cancer. She has two sons. Non smoker. She was a Marine scientist.  Denies any headaches, dizziness, double vision, fevers, chills, night sweats, nausea, vomiting, diarrhea, constipation, chest pain, heart palpitations, shortness of breath, blood in stool, black tarry stool, urinary pain, urinary burning, urinary frequency, hematuria. 14 point review of systems was performed and is negative except as detailed under history of present illness  and above   PHYSICAL EXAMINATION  ECOG PERFORMANCE STATUS: 0 - Asymptomatic  Filed Vitals:   06/24/14 1314  BP: 123/57  Pulse: 69  Temp: 98.1 F (36.7 C)  Resp: 16    GENERAL:alert, no distress, well nourished, well developed, comfortable, cooperative and smiling SKIN: skin color, texture, turgor are normal, no rashes or significant lesions HEAD: Normocephalic, No masses, lesions, tenderness or  abnormalities EYES: normal, PERRLA, EOMI, Conjunctiva are pink and non-injected EARS: External ears normal OROPHARYNX:mucous membranes are moist  NECK: supple, no adenopathy, thyroid normal size, non-tender, without nodularity, no stridor, non-tender, trachea midline LYMPH:  no palpable lymphadenopathy, no hepatosplenomegaly BREAST:Right breast is "nodular" but symmetric throughout. No suspicious palpable abnormalities Left post-mastectomy site well healed and free of suspicious changes. LUNGS: clear to auscultation and percussion HEART: regular rate & rhythm, no murmurs, no gallops, S1 normal and S2 normal ABDOMEN:abdomen soft, non-tender, normal bowel sounds, no masses or organomegaly and no hepatosplenomegaly BACK: Back symmetric, no curvature., No CVA tenderness EXTREMITIES:less then 2 second capillary refill, no joint deformities, effusion, or inflammation, no edema  NEURO: alert & oriented x 3 with fluent speech, no focal motor/sensory deficits, gait normal    LABORATORY DATA: CBC    Component Value Date/Time   WBC 5.8 06/23/2014 0950   RBC 4.61 06/23/2014 0950   HGB 13.9 06/23/2014 0950   HCT 41.9 06/23/2014 0950   PLT 155 06/23/2014 0950   MCV 90.9 06/23/2014 0950   MCH 30.2 06/23/2014 0950   MCHC 33.2 06/23/2014 0950   RDW 13.1 06/23/2014 0950   LYMPHSABS 3.3 06/23/2014 0950   MONOABS 0.3 06/23/2014 0950   EOSABS 0.3 06/23/2014 0950   BASOSABS 0.1 06/23/2014 0950      Chemistry      Component Value Date/Time   NA 140 06/23/2014 0950   K 3.7 06/23/2014 0950   CL 102 06/23/2014 0950   CO2 29 06/23/2014 0950   BUN 16 06/23/2014 0950   CREATININE 0.92 06/23/2014 0950      Component Value Date/Time   CALCIUM 9.3 06/23/2014 0950   ALKPHOS 71 06/23/2014 0950   AST 23 06/23/2014 0950   ALT 17 06/23/2014 0950   BILITOT 0.7 06/23/2014 0950       RADIOGRAPHIC STUDIES:  CLINICAL DATA: Screening. Malignant left mastectomy in 2009.  EXAM: DIGITAL SCREENING  UNILATERAL RIGHT MAMMOGRAM WITH CAD  COMPARISON: Previous exam(s).  ACR Breast Density Category c: The breast tissue is heterogeneously dense, which may obscure small masses.  FINDINGS: There are no findings suspicious for malignancy. Images were processed with CAD.  IMPRESSION: No mammographic evidence of malignancy. A result letter of this screening mammogram will be mailed directly to the patient.  RECOMMENDATION: Screening mammogram in one year. (Code:SM-B-01Y)  BI-RADS CATEGORY 1: Negative.   Electronically Signed  By: Evangeline Dakin M.D.  On: 01/14/2014 10:05    ASSESSMENT:  1. Stage I infiltrating ductal carcinoma the breast status post treatment on CALGB protocol 40101.  2. Reactive depression secondary to the death of her husband Juanda Crumble 8 years ago from metastatic prostate cancer  3. Hypertension  4. Goiter with a dominant right side on therapy with thyroid replacement  5. Poor dental hygiene many years  Patient Active Problem List   Diagnosis Date Noted  . Infiltrating ductal carcinoma of breast, stage 1 12/24/2010  . Depression 12/24/2010  . Hypothyroidism 12/24/2010  . HTN (hypertension) 12/24/2010    PLAN:  1. I personally reviewed and went over laboratory results with the  patient. 2. I personally reviewed and went over radiographic studies with the patient. 3. Next screening mammogram is due in January 2017, Her primary doctor Dr Nevada Crane orders these 4. Return in 6 months for follow-up  5. She is overdue on screening colonoscopy and we will refer her back to Dr. Oneida Alar. 6. Her xanax prescription was refilled.  All questions were answered. The patient knows to call the clinic with any problems, questions or concerns. We can certainly see the patient much sooner if necessary.  This document serves as a record of services personally performed by Ancil Linsey, MD. It was created on her behalf by Arlyce Harman, a trained medical scribe.  The creation of this record is based on the scribe's personal observations and the provider's statements to them. This document has been checked and approved by the attending provider.  I have reviewed the above documentation for accuracy and completeness, and I agree with the above.  Molli Hazard  MD

## 2014-07-04 ENCOUNTER — Telehealth: Payer: Self-pay

## 2014-07-04 NOTE — Telephone Encounter (Signed)
Pt left Vm that she received letter to schedule colonoscopy. I tried to call her and many rings and no answer.

## 2014-07-04 NOTE — Telephone Encounter (Signed)
Pt called back. Gave me triage info. I have faxed request to [facility] for previous procedure/path report.

## 2014-07-11 ENCOUNTER — Encounter (HOSPITAL_COMMUNITY): Payer: Self-pay | Admitting: Hematology & Oncology

## 2014-07-13 ENCOUNTER — Other Ambulatory Visit (HOSPITAL_COMMUNITY): Payer: Self-pay

## 2014-07-13 DIAGNOSIS — C50919 Malignant neoplasm of unspecified site of unspecified female breast: Secondary | ICD-10-CM

## 2014-07-13 NOTE — Telephone Encounter (Signed)
I received the reports from Medical Records.  Pt's colonoscopy was 06/06/2005 by Dr. Tamala Julian. She had multiple small polyps and he had recommended she have her next one in 1 year. Per Laban Emperor, NP, pt to come in for an office visit first. Pt is scheduled OV with Neil Crouch, PA on 08/01/2014 @ 11:00.

## 2014-08-01 ENCOUNTER — Other Ambulatory Visit: Payer: Self-pay

## 2014-08-01 ENCOUNTER — Encounter: Payer: Self-pay | Admitting: Gastroenterology

## 2014-08-01 ENCOUNTER — Ambulatory Visit (INDEPENDENT_AMBULATORY_CARE_PROVIDER_SITE_OTHER): Payer: Medicare Other | Admitting: Gastroenterology

## 2014-08-01 VITALS — BP 109/63 | HR 75 | Temp 98.2°F | Ht 67.0 in | Wt 158.4 lb

## 2014-08-01 DIAGNOSIS — Z860100 Personal history of colon polyps, unspecified: Secondary | ICD-10-CM

## 2014-08-01 DIAGNOSIS — R970 Elevated carcinoembryonic antigen [CEA]: Secondary | ICD-10-CM | POA: Diagnosis not present

## 2014-08-01 MED ORDER — PEG-KCL-NACL-NASULF-NA ASC-C 100 G PO SOLR: 1.0000 | Freq: Once | ORAL | Status: DC

## 2014-08-01 NOTE — Assessment & Plan Note (Signed)
74 year old lady with history of stage I breast cancer, increasing CEA level of unclear significance, who presents to schedule colonoscopy. Clinically she is doing well. Last colonoscopy in 2007, multiple polyps, most cold biopsied, one polyp removed by snare cautery. Pathology showed hyperplastic. Recommend colonoscopy at this time. Patient is agreeable. I have discussed the risks, alternatives, benefits with regards to but not limited to the risk of reaction to medication, bleeding, infection, perforation and the patient is agreeable to proceed. Written consent to be obtained.

## 2014-08-01 NOTE — Progress Notes (Signed)
Primary Care Physician:  Delphina Cahill, MD  Primary Gastroenterologist:  Barney Drain, MD   Chief Complaint  Patient presents with  . Colonoscopy    HPI:  Casey Lopez is a 74 y.o. female here to schedule colonoscopy. Her last colonoscopy was in May 2007 by Dr. Irving Shows. She had multiple large scattered diverticula in the sigmoid colon. Multiple small irregular, pale, sessile polyps in the sigmoid and rectum with cold biopsies performed. One polyp was removed by snare cautery. Pathology showed hyperplastic polyps. She was supposed to go back in one year per his recommendations. She also had large internal and external hemorrhoids.   She has a history of stage I left infiltrating ductal carcinoma the breast. Followed by Dr. Ancil Linsey. Her CEA level had been followed by her previous oncologist and initially noted to be slightly elevated at 6.1 back in December 2014. Last year in June 2015 it was 6.7. Currently CEA of 11.8. Patient was recommended to come back to see [place] for a colonoscopy.  She feels well. BM regular. No melena, brbpr. No abdominal pain. No heartburn. No dysphagia. No unintentional weight loss.  Current Outpatient Prescriptions  Medication Sig Dispense Refill  . ALPRAZolam (XANAX) 0.5 MG tablet Take 0.5-1 tablets (0.25-0.5 mg total) by mouth 2 (two) times daily as needed for anxiety. 60 tablet 3  . cetirizine (ZYRTEC) 10 MG tablet Take 10 mg by mouth daily. Only as needed    . K-DUR 20 MEQ tablet TAKE (1) TABLET BY MOUTH ONCE DAILY. 30 each 2  . levothyroxine (SYNTHROID, LEVOTHROID) 50 MCG tablet Take 50 mcg by mouth daily.      Marland Kitchen lisinopril-hydrochlorothiazide (PRINZIDE,ZESTORETIC) 20-12.5 MG per tablet Take 1 tablet by mouth daily.  4  . propranolol ER (INDERAL LA) 80 MG 24 hr capsule Take 1 capsule by mouth daily.  4  . simvastatin (ZOCOR) 40 MG tablet 40 mg at bedtime.      No current facility-administered medications for this visit.    Allergies as of  08/01/2014 - Review Complete 08/01/2014  Allergen Reaction Noted  . Sulfa antibiotics Swelling 12/24/2010  . Feldene [piroxicam]  12/24/2010  . Penicillins Rash 04/11/2011    Past Medical History  Diagnosis Date  . Infiltrating ductal carcinoma of breast, stage 1 12/24/2010  . Depression 12/24/2010  . Hypothyroidism 12/24/2010  . HTN (hypertension) 12/24/2010  . Goiter   . Sickle cell trait   . Cancer     left breast    Past Surgical History  Procedure Laterality Date  . Mastectomy  2009    left  . Appendectomy    . Abdominal hysterectomy    . Tonsilectomy, adenoidectomy, bilateral myringotomy and tubes    . Hemorrhoid surgery    . Cataract extraction      right eye with lens implant  . Biopsy thyroid    . Cholecystectomy    . Cataract extraction  2008    right  . Colon resection  1965    tumor 4X4 inch per patient. ?colon or other per patient  . Cataract extraction w/phaco  04/16/2011    Procedure: CATARACT EXTRACTION PHACO AND INTRAOCULAR LENS PLACEMENT (IOC);  Surgeon: Elta Guadeloupe T. Gershon Crane, MD;  Location: [facility];  Service: Ophthalmology;  Laterality: Left;  CDE=7.66  . Dental surgery  11/2011    Family History  Problem Relation Age of Onset  . Anesthesia problems Neg Hx   . Hypotension Neg Hx   . Malignant hyperthermia Neg Hx   .  Pseudochol deficiency Neg Hx   . Breast cancer Sister     History   Social History  . Marital Status: Widowed    Spouse Name: N/A  . Number of Children: 2  . Years of Education: N/A   Occupational History  . Not on file.   Social History Main Topics  . Smoking status: Never Smoker   . Smokeless tobacco: Never Used  . Alcohol Use: No  . Drug Use: No  . Sexual Activity: Yes    Birth Control/ Protection: Surgical   Other Topics Concern  . Not on file   Social History Narrative      ROS:  General: Negative for anorexia, weight loss, fever, chills, fatigue, weakness. Eyes: Negative for vision changes.  ENT: Negative for  hoarseness, difficulty swallowing , nasal congestion. CV: Negative for chest pain, angina, palpitations, dyspnea on exertion, peripheral edema.  Respiratory: Negative for dyspnea at rest, dyspnea on exertion, cough, sputum, wheezing.  GI: See history of present illness. GU:  Negative for dysuria, hematuria, urinary incontinence, urinary frequency, nocturnal urination.  MS: Negative for joint pain, low back pain.  Derm: Negative for rash or itching.  Neuro: Negative for weakness, abnormal sensation, seizure, frequent headaches, memory loss, confusion.  Psych: Negative for anxiety, depression, suicidal ideation, hallucinations.  Endo: Negative for unusual weight change.  Heme: Negative for bruising or bleeding. Allergy: Negative for rash or hives.    Physical Examination:  BP 109/63 mmHg  Pulse 75  Temp(Src) 98.2 F (36.8 C) (Oral)  Ht '5\' 7"'$  (1.702 m)  Wt 158 lb 6.4 oz (71.85 kg)  BMI 24.80 kg/m2   General: Well-nourished, well-developed in no acute distress.  Head: Normocephalic, atraumatic.   Eyes: Conjunctiva pink, no icterus. Mouth: Oropharyngeal mucosa moist and pink , no lesions erythema or exudate. Neck: Supple without thyromegaly, masses, or lymphadenopathy.  Lungs: Clear to auscultation bilaterally.  Heart: Regular rate and rhythm, no murmurs rubs or gallops.  Abdomen: Bowel sounds are normal, nontender, nondistended, no hepatosplenomegaly or masses, no abdominal bruits or    hernia , no rebound or guarding.   Rectal: Deferred Extremities: No lower extremity edema. No clubbing or deformities.  Neuro: Alert and oriented x 4 , grossly normal neurologically.  Skin: Warm and dry, no rash or jaundice.   Psych: Alert and cooperative, normal mood and affect.  Labs: Lab Results  Component Value Date   WBC 5.8 06/23/2014   HGB 13.9 06/23/2014   HCT 41.9 06/23/2014   MCV 90.9 06/23/2014   PLT 155 06/23/2014   Lab Results  Component Value Date   CREATININE 0.92 06/23/2014    BUN 16 06/23/2014   NA 140 06/23/2014   K 3.7 06/23/2014   CL 102 06/23/2014   CO2 29 06/23/2014   Lab Results  Component Value Date   ALT 17 06/23/2014   AST 23 06/23/2014   ALKPHOS 71 06/23/2014   BILITOT 0.7 06/23/2014   Lab Results  Component Value Date   CEA 11.8* 06/23/2014   Lab Results  Component Value Date   LABCA2 36.4 06/23/2014     Imaging Studies: No results found.   col

## 2014-08-01 NOTE — Patient Instructions (Addendum)
1. Colonoscopy with Dr. Oneida Alar. See separate instructions.

## 2014-08-09 NOTE — Progress Notes (Signed)
CC'ED TO PCP 

## 2014-08-12 ENCOUNTER — Ambulatory Visit (HOSPITAL_COMMUNITY)
Admission: RE | Admit: 2014-08-12 | Discharge: 2014-08-12 | Disposition: A | Discharge status: Home / Self Care | Payer: Medicare Other | Source: Ambulatory Visit | Attending: Gastroenterology | Admitting: Gastroenterology

## 2014-08-12 ENCOUNTER — Encounter (HOSPITAL_COMMUNITY)
Admission: RE | Disposition: A | Discharge status: Home / Self Care | Payer: Self-pay | Source: Ambulatory Visit | Attending: Gastroenterology

## 2014-08-12 ENCOUNTER — Encounter (HOSPITAL_COMMUNITY): Payer: Self-pay | Admitting: *Deleted

## 2014-08-12 DIAGNOSIS — Z1211 Encounter for screening for malignant neoplasm of colon: Secondary | ICD-10-CM | POA: Diagnosis not present

## 2014-08-12 DIAGNOSIS — I10 Essential (primary) hypertension: Secondary | ICD-10-CM | POA: Diagnosis not present

## 2014-08-12 DIAGNOSIS — E039 Hypothyroidism, unspecified: Secondary | ICD-10-CM | POA: Insufficient documentation

## 2014-08-12 DIAGNOSIS — D120 Benign neoplasm of cecum: Secondary | ICD-10-CM | POA: Diagnosis not present

## 2014-08-12 DIAGNOSIS — K621 Rectal polyp: Secondary | ICD-10-CM

## 2014-08-12 DIAGNOSIS — K635 Polyp of colon: Secondary | ICD-10-CM | POA: Diagnosis not present

## 2014-08-12 DIAGNOSIS — Q438 Other specified congenital malformations of intestine: Secondary | ICD-10-CM | POA: Insufficient documentation

## 2014-08-12 DIAGNOSIS — D124 Benign neoplasm of descending colon: Secondary | ICD-10-CM | POA: Insufficient documentation

## 2014-08-12 DIAGNOSIS — D125 Benign neoplasm of sigmoid colon: Secondary | ICD-10-CM | POA: Insufficient documentation

## 2014-08-12 DIAGNOSIS — D128 Benign neoplasm of rectum: Secondary | ICD-10-CM | POA: Diagnosis not present

## 2014-08-12 DIAGNOSIS — Z79899 Other long term (current) drug therapy: Secondary | ICD-10-CM | POA: Diagnosis not present

## 2014-08-12 DIAGNOSIS — F329 Major depressive disorder, single episode, unspecified: Secondary | ICD-10-CM | POA: Diagnosis not present

## 2014-08-12 DIAGNOSIS — Z853 Personal history of malignant neoplasm of breast: Secondary | ICD-10-CM | POA: Insufficient documentation

## 2014-08-12 DIAGNOSIS — K5730 Diverticulosis of large intestine without perforation or abscess without bleeding: Secondary | ICD-10-CM | POA: Insufficient documentation

## 2014-08-12 DIAGNOSIS — Z86010 Personal history of colonic polyps: Secondary | ICD-10-CM

## 2014-08-12 HISTORY — PX: COLONOSCOPY: SHX5424

## 2014-08-12 SURGERY — COLONOSCOPY
Anesthesia: Moderate Sedation

## 2014-08-12 MED ORDER — STERILE WATER FOR IRRIGATION IR SOLN
Status: DC | PRN
  Administered 2014-08-12: 13:00:00

## 2014-08-12 MED ORDER — MEPERIDINE HCL 100 MG/ML IJ SOLN
INTRAMUSCULAR | Status: DC | PRN
  Administered 2014-08-12 (×3): 25 mg via INTRAVENOUS

## 2014-08-12 MED ORDER — SODIUM CHLORIDE 0.9 % IV SOLN
INTRAVENOUS | Status: DC
  Administered 2014-08-12: 1000 mL via INTRAVENOUS

## 2014-08-12 MED ORDER — MIDAZOLAM HCL 5 MG/5ML IJ SOLN
INTRAMUSCULAR | Status: DC | PRN
  Administered 2014-08-12: 1 mg via INTRAVENOUS
  Administered 2014-08-12 (×3): 2 mg via INTRAVENOUS
  Administered 2014-08-12: 1 mg via INTRAVENOUS

## 2014-08-12 MED ORDER — MEPERIDINE HCL 100 MG/ML IJ SOLN
INTRAMUSCULAR | Status: AC
  Filled 2014-08-12: qty 2

## 2014-08-12 MED ORDER — MIDAZOLAM HCL 5 MG/5ML IJ SOLN
INTRAMUSCULAR | Status: AC
  Filled 2014-08-12: qty 10

## 2014-08-12 NOTE — H&P (Signed)
Primary Care Physician:  Delphina Cahill, MD Primary Gastroenterologist:  Dr. Oneida Alar  Pre-Procedure History & Physical: HPI:  Casey Lopez is a 74 y.o. female here for [place].  Past Medical History  Diagnosis Date  . Infiltrating ductal carcinoma of breast, stage 1 12/24/2010  . Depression 12/24/2010  . Hypothyroidism 12/24/2010  . HTN (hypertension) 12/24/2010  . Goiter   . Sickle cell trait   . Cancer     left breast    Past Surgical History  Procedure Laterality Date  . Mastectomy  2009    left  . Appendectomy    . Abdominal hysterectomy    . Tonsilectomy, adenoidectomy, bilateral myringotomy and tubes    . Hemorrhoid surgery    . Cataract extraction      right eye with lens implant  . Biopsy thyroid    . Cholecystectomy    . Cataract extraction  2008    right  . Colon resection  1965    tumor 4X4 inch per patient. ?colon or other per patient  . Cataract extraction w/phaco  04/16/2011    Procedure: CATARACT EXTRACTION PHACO AND INTRAOCULAR LENS PLACEMENT (IOC);  Surgeon: Elta Guadeloupe T. Gershon Crane, MD;  Location: [facility];  Service: Ophthalmology;  Laterality: Left;  CDE=7.66  . Dental surgery  11/2011    Prior to Admission medications   Medication Sig Start Date End Date Taking? Authorizing Provider  ALPRAZolam Duanne Moron) 0.5 MG tablet Take 0.5-1 tablets (0.25-0.5 mg total) by mouth 2 (two) times daily as needed for anxiety. 06/24/14  Yes Patrici Ranks, MD  cetirizine (ZYRTEC) 10 MG tablet Take 10 mg by mouth daily as needed for allergies. Only as needed   Yes Historical Provider, MD  K-DUR 20 MEQ tablet TAKE (1) TABLET BY MOUTH ONCE DAILY. 08/14/11  Yes Manon Hilding Kefalas, PA-C  levothyroxine (SYNTHROID, LEVOTHROID) 50 MCG tablet Take 50 mcg by mouth daily.     Yes Historical Provider, MD  lisinopril-hydrochlorothiazide (PRINZIDE,ZESTORETIC) 20-12.5 MG per tablet Take 1 tablet by mouth daily. 06/10/14  Yes Historical Provider, MD  peg 3350 powder (MOVIPREP) 100 G SOLR  Take 1 kit (200 g total) by mouth once. 08/01/14 08/31/14 Yes Mahala Menghini, PA-C  propranolol ER (INDERAL LA) 80 MG 24 hr capsule Take 1 capsule by mouth daily. 06/10/14  Yes Historical Provider, MD  simvastatin (ZOCOR) 40 MG tablet Take 40 mg by mouth at bedtime.  12/16/11  Yes Historical Provider, MD    Allergies as of 08/01/2014 - Review Complete 08/01/2014  Allergen Reaction Noted  . Sulfa antibiotics Swelling 12/24/2010  . Feldene [piroxicam]  12/24/2010  . Penicillins Rash 04/11/2011    Family History  Problem Relation Age of Onset  . Anesthesia problems Neg Hx   . Hypotension Neg Hx   . Malignant hyperthermia Neg Hx   . Pseudochol deficiency Neg Hx   . Breast cancer Sister   . Cancer Mother   . Sickle cell anemia Father     History   Social History  . Marital Status: Widowed    Spouse Name: N/A  . Number of Children: 2  . Years of Education: N/A   Occupational History  . Not on file.   Social History Main Topics  . Smoking status: Never Smoker   . Smokeless tobacco: Never Used  . Alcohol Use: No  . Drug Use: No  . Sexual Activity: Yes    Birth Control/ Protection: Surgical   Other Topics Concern  . Not on  file   Social History Narrative    Review of Systems: See HPI, otherwise negative ROS   Physical Exam: BP 138/54 mmHg  Pulse 82  Temp(Src) 97.4 F (36.3 C) (Oral)  Resp 19  Ht _0  (1.702 m)  Wt 156 lb (70.761 kg)  BMI 24.43 kg/m2  SpO2 97% General:   Alert,  pleasant and cooperative in NAD Head:  Normocephalic and atraumatic. Neck:  Supple; Lungs:  Clear throughout to auscultation.    Heart:  Regular rate and rhythm. Abdomen:  Soft, nontender and nondistended. Normal bowel sounds, without guarding, and without rebound.   Neurologic:  Alert and  oriented x4;  grossly normal neurologically.  Impression/Plan:     SCREENING  Plan:  1. TCS TODAY

## 2014-08-12 NOTE — Discharge Instructions (Signed)
You had 6 polyps removed. You have small internal hemorrhoids and diverticulosis IN YOUR RIGHT AND LEFT COLON.   FOLLOW A HIGH FIBER DIET. AVOID ITEMS THAT CAUSE BLOATING. SEE INFO BELOW.  YOUR BIOPSY RESULTS WILL BE AVAILABLE IN MY CHART AFTER AUG 9 AND MY OFFICE WILL CONTACT YOU IN 10-14 DAYS WITH YOUR RESULTS.   Next colonoscopy in 10-15 years if the benefits outweigh the risks.   Colonoscopy Care After Read the instructions outlined below and refer to this sheet in the next week. These discharge instructions provide you with general information on caring for yourself after you leave the hospital. While your treatment has been planned according to the most current medical practices available, unavoidable complications occasionally occur. If you have any problems or questions after discharge, call DR. FIELDS, [phone].  ACTIVITY  You may resume your regular activity, but move at a slower pace for the next 24 hours.   Take frequent rest periods for the next 24 hours.   Walking will help get rid of the air and reduce the bloated feeling in your belly (abdomen).   No driving for 24 hours (because of the medicine (anesthesia) used during the test).   You may shower.   Do not sign any important legal documents or operate any machinery for 24 hours (because of the anesthesia used during the test).    NUTRITION  Drink plenty of fluids.   You may resume your normal diet as instructed by your doctor.   Begin with a light meal and progress to your normal diet. Heavy or fried foods are harder to digest and may make you feel sick to your stomach (nauseated).   Avoid alcoholic beverages for 24 hours or as instructed.    MEDICATIONS  You may resume your normal medications.   WHAT YOU CAN EXPECT TODAY  Some feelings of bloating in the abdomen.   Passage of more gas than usual.   Spotting of blood in your stool or on the toilet paper  .  IF YOU HAD POLYPS REMOVED DURING  THE COLONOSCOPY:  Eat a soft diet IF YOU HAVE NAUSEA, BLOATING, ABDOMINAL PAIN, OR VOMITING.    FINDING OUT THE RESULTS OF YOUR TEST Not all test results are available during your visit. DR. Oneida Alar WILL CALL YOU WITHIN 14 DAYS OF YOUR PROCEDUE WITH YOUR RESULTS. Do not assume everything is normal if you have not heard from DR. FIELDS, CALL HER OFFICE AT [phone].  SEEK IMMEDIATE MEDICAL ATTENTION AND CALL THE OFFICE: [phone] IF:  You have more than a spotting of blood in your stool.   Your belly is swollen (abdominal distention).   You are nauseated or vomiting.   You have a temperature over 101F.   You have abdominal pain or discomfort that is severe or gets worse throughout the day.  Polyps, Colon  A polyp is extra tissue that grows inside your body. Colon polyps grow in the large intestine. The large intestine, also called the colon, is part of your digestive system. It is a long, hollow tube at the end of your digestive tract where your body makes and stores stool. Most polyps are not dangerous. They are benign. This means they are not cancerous. But over time, some types of polyps can turn into cancer. Polyps that are smaller than a pea are usually not harmful. But larger polyps could someday become or may already be cancerous. To be safe, doctors remove all polyps and test them.   PREVENTION There  is not one sure way to prevent polyps. You might be able to lower your risk of getting them if you:  Eat more fruits and vegetables and less fatty food.   Do not smoke.   Avoid alcohol.   Exercise every day.   Lose weight if you are overweight.   Eating more calcium and folate can also lower your risk of getting polyps. Some foods that are rich in calcium are milk, cheese, and broccoli. Some foods that are rich in folate are chickpeas, kidney beans, and spinach.     High-Fiber Diet A high-fiber diet changes your normal diet to include more whole grains, legumes,  fruits, and vegetables. Changes in the diet involve replacing refined carbohydrates with unrefined foods. The calorie level of the diet is essentially unchanged. The Dietary Reference Intake (recommended amount) for adult males is 38 grams per day. For adult females, it is 25 grams per day. Pregnant and lactating women should consume 28 grams of fiber per day. Fiber is the intact part of a plant that is not broken down during digestion. Functional fiber is fiber that has been isolated from the plant to provide a beneficial effect in the body. PURPOSE  Increase stool bulk.   Ease and regulate bowel movements.   Lower cholesterol.  REDUCE RISK OF COLON CANCER  INDICATIONS THAT YOU NEED MORE FIBER  Constipation and hemorrhoids.   Uncomplicated diverticulosis (intestine condition) and irritable bowel syndrome.   Weight management.   As a protective measure against hardening of the arteries (atherosclerosis), diabetes, and cancer.   GUIDELINES FOR INCREASING FIBER IN THE DIET  Start adding fiber to the diet slowly. A gradual increase of about 5 more grams (2 slices of whole-wheat bread, 2 servings of most fruits or vegetables, or 1 bowl of high-fiber cereal) per day is best. Too rapid an increase in fiber may result in constipation, flatulence, and bloating.   Drink enough water and fluids to keep your urine clear or pale yellow. Water, juice, or caffeine-free drinks are recommended. Not drinking enough fluid may cause constipation.   Eat a variety of high-fiber foods rather than one type of fiber.   Try to increase your intake of fiber through using high-fiber foods rather than fiber pills or supplements that contain small amounts of fiber.   The goal is to change the types of food eaten. Do not supplement your present diet with high-fiber foods, but replace foods in your present diet.   INCLUDE A VARIETY OF FIBER SOURCES  Replace refined and processed grains with whole grains, canned  fruits with fresh fruits, and incorporate other fiber sources. White rice, white breads, and most bakery goods contain little or no fiber.   Brown whole-grain rice, buckwheat oats, and many fruits and vegetables are all good sources of fiber. These include: broccoli, Brussels sprouts, cabbage, cauliflower, beets, sweet potatoes, white potatoes (skin on), carrots, tomatoes, eggplant, squash, berries, fresh fruits, and dried fruits.   Cereals appear to be the richest source of fiber. Cereal fiber is found in whole grains and bran. Bran is the fiber-rich outer coat of cereal grain, which is largely removed in refining. In whole-grain cereals, the bran remains. In breakfast cereals, the largest amount of fiber is found in those with "bran" in their names. The fiber content is sometimes indicated on the label.   You may need to include additional fruits and vegetables each day.   In baking, for 1 cup white flour, you may use the following  substitutions:   1 cup whole-wheat flour minus 2 tablespoons.   1/2 cup white flour plus 1/2 cup whole-wheat flour.   Diverticulosis Diverticulosis is a common condition that develops when small pouches (diverticula) form in the wall of the colon. The risk of diverticulosis increases with age. It happens more often in people who eat a low-fiber diet. Most individuals with diverticulosis have no symptoms. Those individuals with symptoms usually experience belly (abdominal) pain, constipation, or loose stools (diarrhea).  HOME CARE INSTRUCTIONS  Increase the amount of fiber in your diet as directed by your caregiver or dietician. This may reduce symptoms of diverticulosis.   Drink at least 6 to 8 glasses of water each day to prevent constipation.   Try not to strain when you have a bowel movement.   Avoiding nuts and seeds to prevent complications is NOT NECESSARY.   FOODS HAVING HIGH FIBER CONTENT INCLUDE:  Fruits. Apple, peach, pear, tangerine, raisins,  prunes.   Vegetables. Brussels sprouts, asparagus, broccoli, cabbage, carrot, cauliflower, romaine lettuce, spinach, summer squash, tomato, winter squash, zucchini.   Starchy Vegetables. Baked beans, kidney beans, lima beans, split peas, lentils, potatoes (with skin).   Grains. Whole wheat bread, brown rice, bran flake cereal, plain oatmeal, white rice, shredded wheat, bran muffins.   SEEK IMMEDIATE MEDICAL CARE IF:  You develop increasing pain or severe bloating.   You have an oral temperature above 101F.   You develop vomiting or bowel movements that are bloody or black.   Hemorrhoids Hemorrhoids are dilated (enlarged) veins around the rectum. Sometimes clots will form in the veins. This makes them swollen and painful. These are called thrombosed hemorrhoids. Causes of hemorrhoids include:  Constipation.   Straining to have a bowel movement.   HEAVY LIFTING REDUCE RISK OF COLON CANCER  HOME CARE INSTRUCTIONS  Eat a well balanced diet and drink 6 to 8 glasses of water every day to avoid constipation. You may also use a bulk laxative.   Avoid straining to have bowel movements.   Keep anal area dry and clean.   Do not use a donut shaped pillow or sit on the toilet for long periods. This increases blood pooling and pain.   Move your bowels when your body has the urge; this will require less straining and will decrease pain and pressure.    Colonoscopy, Care After Refer to this sheet in the next few weeks. These instructions provide you with information on caring for yourself after your procedure. Your health care provider may also give you more specific instructions. Your treatment has been planned according to current medical practices, but problems sometimes occur. Call your health care provider if you have any problems or questions after your procedure. WHAT TO EXPECT AFTER THE PROCEDURE  After your procedure, it is typical to have the following:  A small amount of blood  in your stool.  Moderate amounts of gas and mild abdominal cramping or bloating. HOME CARE INSTRUCTIONS  Do not drive, operate machinery, or sign important documents for 24 hours.  You may shower and resume your regular physical activities, but move at a slower pace for the first 24 hours.  Take frequent rest periods for the first 24 hours.  Walk around or put a warm pack on your abdomen to help reduce abdominal cramping and bloating.  Drink enough fluids to keep your urine clear or pale yellow.  You may resume your normal diet as instructed by your health care provider. Avoid heavy or fried foods  that are hard to digest.  Avoid drinking alcohol for 24 hours or as instructed by your health care provider.  Only take over-the-counter or prescription medicines as directed by your health care provider.  If a tissue sample (biopsy) was taken during your procedure:  Do not take aspirin or blood thinners for 7 days, or as instructed by your health care provider.  Do not drink alcohol for 7 days, or as instructed by your health care provider.  Eat soft foods for the first 24 hours. SEEK MEDICAL CARE IF: You have persistent spotting of blood in your stool 2-3 days after the procedure. SEEK IMMEDIATE MEDICAL CARE IF:  You have more than a small spotting of blood in your stool.  You pass large blood clots in your stool.  Your abdomen is swollen (distended).  You have nausea or vomiting.  You have a fever.  You have increasing abdominal pain that is not relieved with medicine. Document Released: 08/08/2003 Document Revised: 10/14/2012 Document Reviewed: 08/31/2012 [facility] Patient Information 2015 [place], [place]. This information is not intended to replace advice given to you by your health care provider. Make sure you discuss any questions you have with your health care provider.

## 2014-08-12 NOTE — Op Note (Signed)
[facility] [address] [place], [address]   COLONOSCOPY PROCEDURE REPORT  PATIENT: Casey, Lopez  MR#: [id number] BIRTHDATE: 10-25-1940 , 74  yrs. old GENDER: female ENDOSCOPIST: Danie Binder, MD REFERRED MB:BUYZ Hall, M.D. PROCEDURE DATE:  08-20-2014 PROCEDURE:   Colonoscopy with cold biopsy polypectomy INDICATIONS:high risk patient with personal history of HYPERPLASTICpolyps. MEDICATIONS: Demerol 75 mg IV and Versed 8 mg IV  DESCRIPTION OF PROCEDURE:    Physical exam was performed.  Informed consent was obtained from the patient after explaining the benefits, risks, and alternatives to procedure.  The patient was connected to monitor and placed in left lateral position. Continuous oxygen was provided by nasal cannula and IV medicine administered through an indwelling cannula.  After administration of sedation and rectal exam, the patients rectum was intubated and the EC-[id number] ([id number])  colonoscope was advanced under direct visualization to the cecum.  The scope was removed slowly by carefully examining the color, texture, anatomy, and integrity mucosa on the way out.  The patient was recovered in endoscopy and discharged home in satisfactory condition. Estimated blood loss is zero unless otherwise noted in this procedure report.    COLON FINDINGS: There was moderate diverticulosis noted throughout the entire examined colon with associated muscular hypertrophy and tortuosity.  , SEVEN sessile polyps ranging from 2 to 63m in size were found at the cecum(1), in the descending colon(1), sigmoid colon(3), and rectum(2).  A polypectomy was performed with cold forceps.  , The colon was redundant.  Manual abdominal counter-pressure was used to reach the cecum.  The patient was moved on to their back to reach the cecum, and Small internal hemorrhoids were found.  PREP QUALITY: good. CECAL W/D TIME: 17       minutes COMPLICATIONS: None  ENDOSCOPIC  IMPRESSION: 1.   Moderate diverticulosis throughout the entire examined colon 2.   SEVEN COLORECTAL polyps REMOVED  RECOMMENDATIONS: FOLLOW A HIGH FIBER DIET. AWAIT BIOPSY RESULTS. Next colonoscopy in 10-15 years if the benefits outweigh the risks.      _______________________________ eLorrin MaisDanie Binder MD 008/13/163:07 PM    CPT CODES: ICD CODES:  The ICD and CPT codes recommended by this software are interpretations from the data that the clinical staff has captured with the software.  The verification of the translation of this report to the ICD and CPT codes and modifiers is the sole responsibility of the health care institution and practicing physician where this report was generated.  [facility] will not be held responsible for the validity of the ICD and CPT codes included on this report.  AMA assumes no liability for data contained or not contained herein. CPT is a rDesigner, television/film setof the [employer]

## 2014-08-18 ENCOUNTER — Telehealth: Payer: Self-pay | Admitting: Gastroenterology

## 2014-08-18 ENCOUNTER — Encounter (HOSPITAL_COMMUNITY): Payer: Self-pay | Admitting: Gastroenterology

## 2014-08-18 NOTE — Telephone Encounter (Signed)
Pt is aware of results. 

## 2014-08-18 NOTE — Telephone Encounter (Signed)
Please call pt. She had ONE simple adenoma removed from her colon. FOLLOW A High fiber diet. TCS in 10-15 years IF THE BENEFITS OUTWEIGH THE RISKS.

## 2014-08-18 NOTE — Telephone Encounter (Signed)
Called pt and LMOM to call office back  

## 2014-08-22 NOTE — Telephone Encounter (Signed)
REMINDER IN EPIC °

## 2014-08-24 ENCOUNTER — Encounter (HOSPITAL_COMMUNITY): Payer: Medicare Other | Attending: Hematology & Oncology

## 2014-08-24 DIAGNOSIS — F419 Anxiety disorder, unspecified: Secondary | ICD-10-CM | POA: Insufficient documentation

## 2014-08-24 DIAGNOSIS — C50912 Malignant neoplasm of unspecified site of left female breast: Secondary | ICD-10-CM | POA: Insufficient documentation

## 2014-08-24 DIAGNOSIS — E038 Other specified hypothyroidism: Secondary | ICD-10-CM | POA: Diagnosis not present

## 2014-08-24 DIAGNOSIS — C50919 Malignant neoplasm of unspecified site of unspecified female breast: Secondary | ICD-10-CM

## 2014-08-25 LAB — CEA: CEA: 11.4 ng/mL — AB (ref 0.0–4.7)

## 2014-09-23 ENCOUNTER — Encounter (HOSPITAL_COMMUNITY): Payer: Medicare Other | Attending: Hematology & Oncology

## 2014-09-23 DIAGNOSIS — Z853 Personal history of malignant neoplasm of breast: Secondary | ICD-10-CM

## 2014-09-23 DIAGNOSIS — E038 Other specified hypothyroidism: Secondary | ICD-10-CM | POA: Insufficient documentation

## 2014-09-23 DIAGNOSIS — C50912 Malignant neoplasm of unspecified site of left female breast: Secondary | ICD-10-CM | POA: Diagnosis present

## 2014-09-23 DIAGNOSIS — F419 Anxiety disorder, unspecified: Secondary | ICD-10-CM | POA: Insufficient documentation

## 2014-09-24 LAB — CEA: CEA: 11.7 ng/mL — AB (ref 0.0–4.7)

## 2014-09-26 NOTE — Progress Notes (Signed)
LABS DRAWN

## 2014-11-08 ENCOUNTER — Telehealth (HOSPITAL_COMMUNITY): Payer: Self-pay

## 2014-11-08 DIAGNOSIS — C50912 Malignant neoplasm of unspecified site of left female breast: Secondary | ICD-10-CM

## 2014-11-08 DIAGNOSIS — E038 Other specified hypothyroidism: Secondary | ICD-10-CM

## 2014-11-08 DIAGNOSIS — F419 Anxiety disorder, unspecified: Secondary | ICD-10-CM

## 2014-11-08 MED ORDER — ALPRAZOLAM 0.5 MG PO TABS: 0.2500 mg | ORAL_TABLET | Freq: Two times a day (BID) | ORAL | Status: DC | PRN

## 2014-11-08 NOTE — Telephone Encounter (Signed)
Alpraxolam refills x 3 called into pharmacy per request of Robynn Pane, PA-C.

## 2014-12-14 ENCOUNTER — Other Ambulatory Visit (HOSPITAL_COMMUNITY): Payer: Self-pay | Admitting: Internal Medicine

## 2014-12-14 DIAGNOSIS — Z1231 Encounter for screening mammogram for malignant neoplasm of breast: Secondary | ICD-10-CM

## 2014-12-23 ENCOUNTER — Encounter (HOSPITAL_COMMUNITY): Payer: Medicare Other | Attending: Hematology & Oncology | Admitting: Hematology & Oncology

## 2014-12-23 ENCOUNTER — Encounter (HOSPITAL_COMMUNITY): Payer: Medicare Other

## 2014-12-23 ENCOUNTER — Encounter (HOSPITAL_COMMUNITY): Payer: Self-pay | Admitting: Hematology & Oncology

## 2014-12-23 VITALS — BP 134/66 | HR 75 | Temp 97.5°F | Resp 18 | Wt 160.1 lb

## 2014-12-23 DIAGNOSIS — C50912 Malignant neoplasm of unspecified site of left female breast: Secondary | ICD-10-CM

## 2014-12-23 DIAGNOSIS — Z853 Personal history of malignant neoplasm of breast: Secondary | ICD-10-CM

## 2014-12-23 LAB — CBC WITH DIFFERENTIAL/PLATELET
BASOS PCT: 1 %
Basophils Absolute: 0.1 10*3/uL (ref 0.0–0.1)
EOS PCT: 7 %
Eosinophils Absolute: 0.5 10*3/uL (ref 0.0–0.7)
HCT: 43.2 % (ref 36.0–46.0)
HEMOGLOBIN: 14.4 g/dL (ref 12.0–15.0)
Lymphocytes Relative: 55 %
Lymphs Abs: 4 10*3/uL (ref 0.7–4.0)
MCH: 30.4 pg (ref 26.0–34.0)
MCHC: 33.3 g/dL (ref 30.0–36.0)
MCV: 91.3 fL (ref 78.0–100.0)
Monocytes Absolute: 0.4 10*3/uL (ref 0.1–1.0)
Monocytes Relative: 5 %
NEUTROS PCT: 32 %
Neutro Abs: 2.3 10*3/uL (ref 1.7–7.7)
Platelets: 179 10*3/uL (ref 150–400)
RBC: 4.73 MIL/uL (ref 3.87–5.11)
RDW: 12.6 % (ref 11.5–15.5)
WBC: 7.2 10*3/uL (ref 4.0–10.5)

## 2014-12-23 LAB — COMPREHENSIVE METABOLIC PANEL
ALBUMIN: 4.2 g/dL (ref 3.5–5.0)
ALK PHOS: 88 U/L (ref 38–126)
ALT: 21 U/L (ref 14–54)
ANION GAP: 7 (ref 5–15)
AST: 27 U/L (ref 15–41)
BUN: 17 mg/dL (ref 6–20)
CALCIUM: 9.7 mg/dL (ref 8.9–10.3)
CO2: 30 mmol/L (ref 22–32)
Chloride: 103 mmol/L (ref 101–111)
Creatinine, Ser: 0.88 mg/dL (ref 0.44–1.00)
GFR calc non Af Amer: >60 mL/min (ref >60)
Glucose, Bld: 84 mg/dL (ref 65–99)
Potassium: 3.8 mmol/L (ref 3.5–5.1)
Sodium: 140 mmol/L (ref 135–145)
Total Bilirubin: 0.3 mg/dL (ref 0.3–1.2)
Total Protein: 8.1 g/dL (ref 6.5–8.1)

## 2014-12-23 NOTE — Patient Instructions (Signed)
[place] at [facility] Discharge Instructions  RECOMMENDATIONS MADE BY THE CONSULTANT AND ANY TEST RESULTS WILL BE SENT TO YOUR REFERRING PHYSICIAN.   Exam completed by Dr Whitney Muse today Return to see the doctor in 1 year Clinical breast exam today Please call the clinic if you have any questions or concerns    Thank you for choosing [place] at [facility] to provide your oncology and hematology care.  To afford each patient quality time with our provider, please arrive at least 15 minutes before your scheduled appointment time.    You need to re-schedule your appointment should you arrive 10 or more minutes late.  We strive to give you quality time with our providers, and arriving late affects you and other patients whose appointments are after yours.  Also, if you no show three or more times for appointments you may be dismissed from the clinic at the providers discretion.     Again, thank you for choosing [facility]  Our hope is that these requests will decrease the amount of time that you wait before being seen by our physicians.       _____________________________________________________________  Should you have questions after your visit to [facility], please contact our office at [phone] between the hours of 8:30 a.m. and 4:30 p.m.  Voicemails left after 4:30 p.m. will not be returned until the following business day.  For prescription refill requests, have your pharmacy contact our office.

## 2014-12-23 NOTE — Progress Notes (Signed)
Casey Neighbors, MD  [place] [address]  No diagnosis found.  CURRENT THERAPY: Surveillance  INTERVAL HISTORY: Casey Lopez 74 y.o. 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   She was randomized to Adriamycin and Cytoxan at a dose dense fashion and treated with Herceptin for 52 weeks finishing therapy as of 07/06/1998. Her cancer was 1.9 cm in size, grade 2, no LV I was seen, ER receptor 0%, PR receptor 0%, HER-2/neu 3+ positive. She is status post left modified radical mastectomy on 02/06/2007.   She denies any oncology complaints and ROS questioning is negative.   Ms. Krolikowski returns to the cancer center alone today.   With regards to her appetite, she says she eats vegetables and fruit; "this morning at 1 o'clock I decided I wanted some apples." She says that her appetite "isn't like it was when Sugar was living because when you're on your own, you eat out a lot." She says she does still cook too and eats leftovers.  She says that her nose is running a little, and she takes Zyrtec for her allergies. She denies any cough, saying "don't give it to me, I ain't claiming it!"  Her main complaint is with regards to "losing her Sugar." She says no other men are worthwhile.  She says "they ain't comin' up in my house. Two men come in my house: my two sons. That's it." She says "they look after Mommy real good."  She cannot take flu shots because she reports that she is allergic to something in them.  She says she's not ready for Christmas because it's not the same ever since she lost her husband.   R mammogram is ordered for January. Colonoscopy was performed on 08/12/2014 with Dr. Oneida Alar, 7 polyps removed.  Past Medical History  Diagnosis Date  . Infiltrating ductal carcinoma of breast, stage 1 ([place]) 12/24/2010  . Depression 12/24/2010  . Hypothyroidism  12/24/2010  . HTN (hypertension) 12/24/2010  . Goiter   . Sickle cell trait ([place])   . Cancer [facility])     left breast    has Infiltrating ductal carcinoma of breast, stage 1 ([place]); Depression; Hypothyroidism; HTN (hypertension); History of colon polyps; Elevated CEA; and Hx of colonic polyps on her problem list.     is allergic to sulfa antibiotics; feldene; and penicillins.  Ms. Cauthon had no medications administered during this visit.  Past Surgical History  Procedure Laterality Date  . Mastectomy  2009    left  . Appendectomy    . Abdominal hysterectomy    . Tonsilectomy, adenoidectomy, bilateral myringotomy and tubes    . Hemorrhoid surgery    . Cataract extraction      right eye with lens implant  . Biopsy thyroid    . Cholecystectomy    . Cataract extraction  2008    right  . Colon resection  1965    tumor 4X4 inch per patient. ?colon or other per patient  . Cataract extraction w/phaco  04/16/2011    Procedure: CATARACT EXTRACTION PHACO AND INTRAOCULAR LENS PLACEMENT (IOC);  Surgeon: Elta Guadeloupe T. Gershon Crane, MD;  Location: [facility] ORS;  Service: Ophthalmology;  Laterality: Left;  CDE=7.66  . Dental surgery  11/2011  . Colonoscopy N/A 08/12/2014    Procedure: COLONOSCOPY;  Surgeon: Danie Binder, MD;  Location: [facility] ENDO SUITE;  Service: Endoscopy;  Laterality: N/A;  1300  Widowed for 8 years. Her husband died of cancer while she was also going through cancer. She has two sons. Non smoker. She was a Marine scientist.  Denies any headaches, dizziness, double vision, fevers, chills, night sweats, nausea, vomiting, diarrhea, constipation, chest pain, heart palpitations, shortness of breath, blood in stool, black tarry stool, urinary pain, urinary burning, urinary frequency, hematuria.  14 point review of systems was performed and is negative except as detailed under history of present illness and above    PHYSICAL EXAMINATION  ECOG PERFORMANCE STATUS: 0 - Asymptomatic  Filed Vitals:   12/23/14  1341  BP: 134/66  Pulse: 75  Temp: 97.5 F (36.4 C)  Resp: 18    GENERAL:alert, no distress, well nourished, well developed, comfortable, cooperative and smiling SKIN: skin color, texture, turgor are normal, no rashes or significant lesions HEAD: Normocephalic, No masses, lesions, tenderness or abnormalities EYES: normal, PERRLA, EOMI, Conjunctiva are pink and non-injected EARS: External ears normal OROPHARYNX:mucous membranes are moist  NECK: supple, no adenopathy, thyroid normal size, non-tender, without nodularity, no stridor, non-tender, trachea midline LYMPH:  no palpable lymphadenopathy, no hepatosplenomegaly CARDIOVASCULAR:  Occasional ectopy, rare, S1/S2 audible  BREAST:  Right breast is "nodular" but symmetric throughout. No suspicious palpable abnormalities no suspicious skin changes  Left post-mastectomy site well healed and free of suspicious changes. No palpable nodularity. LUNGS: clear to auscultation and percussion HEART: regular rate & rhythm, no murmurs, no gallops, S1 normal and S2 normal ABDOMEN:abdomen soft, non-tender, normal bowel sounds, no masses or organomegaly and no hepatosplenomegaly BACK: Back symmetric, no curvature., No CVA tenderness EXTREMITIES:less then 2 second capillary refill, no joint deformities, effusion, or inflammation, no edema  NEURO: alert & oriented x 3 with fluent speech, no focal motor/sensory deficits, gait normal   LABORATORY DATA: I have reviewed the data as listed.  CBC    Component Value Date/Time   WBC 7.2 12/23/2014 1311   RBC 4.73 12/23/2014 1311   HGB 14.4 12/23/2014 1311   HCT 43.2 12/23/2014 1311   PLT 179 12/23/2014 1311   MCV 91.3 12/23/2014 1311   MCH 30.4 12/23/2014 1311   MCHC 33.3 12/23/2014 1311   RDW 12.6 12/23/2014 1311   LYMPHSABS 4.0 12/23/2014 1311   MONOABS 0.4 12/23/2014 1311   EOSABS 0.5 12/23/2014 1311   BASOSABS 0.1 12/23/2014 1311      Chemistry      Component Value Date/Time   NA 140  06/23/2014 0950   K 3.7 06/23/2014 0950   CL 102 06/23/2014 0950   CO2 29 06/23/2014 0950   BUN 16 06/23/2014 0950   CREATININE 0.92 06/23/2014 0950      Component Value Date/Time   CALCIUM 9.3 06/23/2014 0950   ALKPHOS 71 06/23/2014 0950   AST 23 06/23/2014 0950   ALT 17 06/23/2014 0950   BILITOT 0.7 06/23/2014 0950      RADIOGRAPHIC STUDIES:  Study Result     CLINICAL DATA: Screening. Malignant left mastectomy in 2009.  EXAM: DIGITAL SCREENING UNILATERAL RIGHT MAMMOGRAM WITH CAD  COMPARISON: Previous exam(s).  ACR Breast Density Category c: The breast tissue is heterogeneously dense, which may obscure small masses.  FINDINGS: There are no findings suspicious for malignancy. Images were processed with CAD.  IMPRESSION: No mammographic evidence of malignancy. A result letter of this screening mammogram will be mailed directly to the patient.  RECOMMENDATION: Screening mammogram in one year. (Code:SM-B-01Y)  BI-RADS CATEGORY 1: Negative.   Electronically Signed  By: Evangeline Dakin  M.D.  On: 01/14/2014 10:05      ASSESSMENT:  1. Stage I infiltrating ductal carcinoma the breast status post treatment on CALGB protocol 40101.  2. Reactive depression secondary to the death of her husband Juanda Crumble 8 years ago from metastatic prostate cancer  3. Hypertension  4. Goiter with a dominant right side on therapy with thyroid replacement  5. Poor dental hygiene many years  39. Colonic Polyps  Patient Active Problem List   Diagnosis Date Noted  . Hx of colonic polyps   . History of colon polyps 08/01/2014  . Elevated CEA 08/01/2014  . Infiltrating ductal carcinoma of breast, stage 1 ([place]) 12/24/2010  . Depression 12/24/2010  . Hypothyroidism 12/24/2010  . HTN (hypertension) 12/24/2010    PLAN:  1. I personally reviewed and went over laboratory results with the patient. 2. I personally reviewed and went over radiographic studies with the  patient. 3. Next screening mammogram of R breast is due in January 2017, Her primary doctor Dr Nevada Crane orders these 4. Return in 1 year for follow-up   I performed a breast exam on Ms. Bousquet today. She will return in one year.  All questions were answered. The patient knows to call the clinic with any problems, questions or concerns. We can certainly see the patient much sooner if necessary.  This document serves as a record of services personally performed by Ancil Linsey, MD. It was created on her behalf by Toni Amend, a trained medical scribe. The creation of this record is based on the scribe's personal observations and the provider's statements to them. This document has been checked and approved by the attending provider.  I have reviewed the above documentation for accuracy and completeness, and I agree with the above.  Molli Hazard, MD

## 2015-01-16 ENCOUNTER — Ambulatory Visit (HOSPITAL_COMMUNITY): Payer: Medicare Other

## 2015-01-26 ENCOUNTER — Ambulatory Visit (HOSPITAL_COMMUNITY)
Admission: RE | Admit: 2015-01-26 | Discharge: 2015-01-26 | Disposition: A | Discharge status: Home / Self Care | Payer: Medicare Other | Source: Ambulatory Visit | Attending: Internal Medicine | Admitting: Internal Medicine

## 2015-01-26 ENCOUNTER — Other Ambulatory Visit (HOSPITAL_COMMUNITY): Payer: Self-pay | Admitting: Internal Medicine

## 2015-01-26 ENCOUNTER — Ambulatory Visit (HOSPITAL_COMMUNITY): Payer: Medicare Other

## 2015-01-26 DIAGNOSIS — Z1231 Encounter for screening mammogram for malignant neoplasm of breast: Secondary | ICD-10-CM

## 2015-01-26 DIAGNOSIS — Z9012 Acquired absence of left breast and nipple: Secondary | ICD-10-CM | POA: Diagnosis not present

## 2015-06-27 ENCOUNTER — Other Ambulatory Visit (HOSPITAL_COMMUNITY): Payer: Self-pay | Admitting: Oncology

## 2015-06-27 DIAGNOSIS — F419 Anxiety disorder, unspecified: Secondary | ICD-10-CM

## 2015-06-27 MED ORDER — ALPRAZOLAM 0.5 MG PO TABS: 0.2500 mg | ORAL_TABLET | Freq: Two times a day (BID) | ORAL | Status: DC | PRN

## 2015-06-28 ENCOUNTER — Other Ambulatory Visit (HOSPITAL_COMMUNITY): Payer: Self-pay

## 2015-06-28 MED ORDER — ALPRAZOLAM 0.5 MG PO TABS: 0.2500 mg | ORAL_TABLET | Freq: Two times a day (BID) | ORAL | Status: DC | PRN

## 2015-06-28 NOTE — Progress Notes (Signed)
Called in xanax 0.'5mg'$  to [facility], per Kirby Crigler PA-C

## 2015-06-28 NOTE — Progress Notes (Signed)
Called in script for xanax 0.'5mg'$  tablet per Kirby Crigler PA-C

## 2015-10-16 ENCOUNTER — Other Ambulatory Visit (HOSPITAL_COMMUNITY): Payer: Self-pay

## 2015-10-17 ENCOUNTER — Other Ambulatory Visit (HOSPITAL_COMMUNITY): Payer: Self-pay | Admitting: Oncology

## 2015-10-17 MED ORDER — ALPRAZOLAM 0.5 MG PO TABS: 0.2500 mg | ORAL_TABLET | Freq: Two times a day (BID) | ORAL | 3 refills | Status: DC | PRN

## 2015-11-14 ENCOUNTER — Other Ambulatory Visit (HOSPITAL_COMMUNITY): Payer: Self-pay | Admitting: Oncology

## 2015-11-14 ENCOUNTER — Other Ambulatory Visit (HOSPITAL_COMMUNITY): Payer: Self-pay

## 2015-11-14 MED ORDER — ALPRAZOLAM 0.5 MG PO TABS: 0.2500 mg | ORAL_TABLET | Freq: Two times a day (BID) | ORAL | 3 refills | Status: DC | PRN

## 2015-12-19 ENCOUNTER — Other Ambulatory Visit (HOSPITAL_COMMUNITY): Payer: Self-pay | Admitting: Hematology & Oncology

## 2015-12-19 DIAGNOSIS — Z1231 Encounter for screening mammogram for malignant neoplasm of breast: Secondary | ICD-10-CM

## 2015-12-25 ENCOUNTER — Encounter (HOSPITAL_COMMUNITY): Payer: Self-pay | Admitting: Oncology

## 2015-12-25 ENCOUNTER — Encounter (HOSPITAL_COMMUNITY): Payer: Medicare Other | Attending: Hematology & Oncology | Admitting: Oncology

## 2015-12-25 VITALS — BP 117/48 | HR 75 | Temp 97.4°F | Resp 16 | Ht 67.0 in | Wt 164.5 lb

## 2015-12-25 DIAGNOSIS — F419 Anxiety disorder, unspecified: Secondary | ICD-10-CM

## 2015-12-25 DIAGNOSIS — Z853 Personal history of malignant neoplasm of breast: Secondary | ICD-10-CM

## 2015-12-25 DIAGNOSIS — C50912 Malignant neoplasm of unspecified site of left female breast: Secondary | ICD-10-CM

## 2015-12-25 MED ORDER — ALPRAZOLAM 0.5 MG PO TABS: 0.2500 mg | ORAL_TABLET | Freq: Two times a day (BID) | ORAL | 3 refills | Status: DC | PRN

## 2015-12-25 NOTE — Progress Notes (Signed)
Casey Neighbors, MD [place] [address]  Anxiety - Plan: ALPRAZolam Casey Lopez) 0.5 MG tablet, DISCONTINUED: ALPRAZolam (XANAX) 0.5 MG tablet, DISCONTINUED: ALPRAZolam (XANAX) 0.5 MG tablet, DISCONTINUED: ALPRAZolam (XANAX) 0.5 MG tablet  Infiltrating ductal carcinoma of breast, stage 1, left ([facility])  CURRENT THERAPY: Surveillance per NCCN guidelines  INTERVAL HISTORY: Casey Lopez 75 y.o. 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 She was randomized to Adriamycin and Cytoxan at a dose dense fashion and treated with Herceptin for 52 weeks finishing therapy as of 07/06/1998. Her cancer was 1.9 cm in size, grade 2, no LV I was seen, ER receptor 0%, PR receptor 0%, HER-2/neu 3+ positive. She is status post left modified radical mastectomy on 02/06/2007.  She is doing well.  She reports "I miss my husband so much."  He passed a number of years ago.  She uses Xanax for her anxiety.  She denies any complaints.  She denies any new breast findings that are abnormal.  She is up to date on mammography.  She continues to follow with Dr. Nevada Lopez as directed.  She denies any oncology complaints today.  Review of Systems  Constitutional: Negative.  Negative for chills, fever and weight loss.  HENT: Negative.   Eyes: Negative.  Negative for double vision.  Respiratory: Negative.  Negative for cough.   Cardiovascular: Negative.  Negative for chest pain.  Gastrointestinal: Negative.  Negative for blood in stool and melena.  Genitourinary: Negative.  Negative for dysuria.  Musculoskeletal: Negative.  Negative for falls.  Skin: Negative.   Neurological: Negative.  Negative for weakness.  Endo/Heme/Allergies: Negative.   Psychiatric/Behavioral: Positive for depression (reactive to the loss of her husband years ago.).    Past Medical History:  Diagnosis Date  . Cancer ([place])    left breast  .  Depression 12/24/2010  . Goiter   . HTN (hypertension) 12/24/2010  . Hypothyroidism 12/24/2010  . Infiltrating ductal carcinoma of breast, stage 1 ([place]) 12/24/2010  . Sickle cell trait [facility])     Past Surgical History:  Procedure Laterality Date  . ABDOMINAL HYSTERECTOMY    . APPENDECTOMY    . BIOPSY THYROID    . CATARACT EXTRACTION     right eye with lens implant  . CATARACT EXTRACTION  2008   right  . CATARACT EXTRACTION W/PHACO  04/16/2011   Procedure: CATARACT EXTRACTION PHACO AND INTRAOCULAR LENS PLACEMENT (IOC);  Surgeon: Elta Guadeloupe T. Gershon Crane, MD;  Location: [facility] ORS;  Service: Ophthalmology;  Laterality: Left;  CDE=7.66  . CHOLECYSTECTOMY    . COLON RESECTION  1965   tumor 4X4 inch per patient. ?colon or other per patient  . COLONOSCOPY N/A 08/12/2014   Procedure: COLONOSCOPY;  Surgeon: Danie Binder, MD;  Location: [facility] ENDO SUITE;  Service: Endoscopy;  Laterality: N/A;  1300  . DENTAL SURGERY  11/2011  . HEMORRHOID SURGERY    . MASTECTOMY  2009   left  . TONSILECTOMY, ADENOIDECTOMY, BILATERAL MYRINGOTOMY AND TUBES      Family History  Problem Relation Age of Onset  . Anesthesia problems Neg Hx   . Hypotension Neg Hx   . Malignant hyperthermia Neg Hx   . Pseudochol deficiency Neg Hx   . Breast cancer Sister   . Cancer Mother   . Sickle cell anemia Father     Social History   Social History  . Marital status: Widowed  Spouse name: N/A  . Number of children: 2  . Years of education: N/A   Social History Main Topics  . Smoking status: Never Smoker  . Smokeless tobacco: Never Used  . Alcohol use No  . Drug use: No  . Sexual activity: Yes    Birth control/ protection: Surgical   Other Topics Concern  . None   Social History Narrative  . None     PHYSICAL EXAMINATION  ECOG PERFORMANCE STATUS: 0 - Asymptomatic  Vitals:   12/25/15 1336  BP: (!) 117/48  Pulse: 75  Resp: 16  Temp: 97.4 F (36.3 C)    GENERAL:alert, no distress, well nourished, well  developed, comfortable, cooperative, smiling and unaccompanied SKIN: skin color, texture, turgor are normal, no rashes or significant lesions HEAD: Normocephalic, No masses, lesions, tenderness or abnormalities EYES: normal, EOMI EARS: External ears normal OROPHARYNX:lips, buccal mucosa, and tongue normal and mucous membranes are moist  NECK: supple, no adenopathy, thyroid normal size, non-tender, without nodularity, trachea midline LYMPH:  no palpable lymphadenopathy, no hepatosplenomegaly BREAST:right breast normal without mass, skin or nipple changes or axillary nodes, left post-mastectomy site well healed and free of suspicious changes LUNGS: clear to auscultation and percussion HEART: regular rate & rhythm, no murmurs, no gallops, S1 normal and S2 normal ABDOMEN:abdomen soft, non-tender, normal bowel sounds and no masses or organomegaly BACK: Back symmetric, no curvature. EXTREMITIES:less then 2 second capillary refill, no joint deformities, effusion, or inflammation, no skin discoloration, no cyanosis  NEURO: alert & oriented x 3 with fluent speech, no focal motor/sensory deficits, gait normal   LABORATORY DATA: CBC    Component Value Date/Time   WBC 7.2 12/23/2014 1311   RBC 4.73 12/23/2014 1311   HGB 14.4 12/23/2014 1311   HCT 43.2 12/23/2014 1311   PLT 179 12/23/2014 1311   MCV 91.3 12/23/2014 1311   MCH 30.4 12/23/2014 1311   MCHC 33.3 12/23/2014 1311   RDW 12.6 12/23/2014 1311   LYMPHSABS 4.0 12/23/2014 1311   MONOABS 0.4 12/23/2014 1311   EOSABS 0.5 12/23/2014 1311   BASOSABS 0.1 12/23/2014 1311      Chemistry      Component Value Date/Time   NA 140 12/23/2014 1311   K 3.8 12/23/2014 1311   CL 103 12/23/2014 1311   CO2 30 12/23/2014 1311   BUN 17 12/23/2014 1311   CREATININE 0.88 12/23/2014 1311      Component Value Date/Time   CALCIUM 9.7 12/23/2014 1311   ALKPHOS 88 12/23/2014 1311   AST 27 12/23/2014 1311   ALT 21 12/23/2014 1311   BILITOT 0.3  12/23/2014 1311        PENDING LABS:   RADIOGRAPHIC STUDIES:  No results found.   PATHOLOGY:    ASSESSMENT AND PLAN:  Infiltrating ductal carcinoma of breast, stage 1 ([facility]) Stage I infiltrating ductal carcinoma the breast status post treatment on CALGB protocol 40101. She was randomized to Adriamycin and Cytoxan at a dose dense fashion and treated with Herceptin for 52 weeks finishing therapy as of 07/06/1998. Her cancer was 1.9 cm in size, grade 2, no LV I was seen, ER receptor 0%, PR receptor 0%, HER-2/neu 3+ positive. She is status post left modified radical mastectomy on 02/06/2007.  No oncology role for labs today.  I personally reviewed and went over radiographic studies with the patient.  The results are noted within this dictation.  Mammogram on 01/27/2015 was negative.  She will be due next month for her next mammogram and  this is scheduled for 01/29/2016.  I have refilled her Xanax after reviewing the [facility] Controlled Substance Reporting System.  Return in 12 months for follow-up, sooner if needed.   ORDERS PLACED FOR THIS ENCOUNTER: No orders of the defined types were placed in this encounter.   MEDICATIONS PRESCRIBED THIS ENCOUNTER: Meds ordered this encounter  Medications  . DISCONTD: ALPRAZolam (XANAX) 0.5 MG tablet    Sig: Take 0.5-1 tablets (0.25-0.5 mg total) by mouth 2 (two) times daily as needed for anxiety.    Dispense:  60 tablet    Refill:  3  . DISCONTD: ALPRAZolam (XANAX) 0.5 MG tablet    Sig: Take 0.5-1 tablets (0.25-0.5 mg total) by mouth 2 (two) times daily as needed for anxiety.    Dispense:  60 tablet    Refill:  3    Order Specific Question:   Supervising Provider    Answer:   Patrici Ranks [id number]  . DISCONTD: ALPRAZolam (XANAX) 0.5 MG tablet    Sig: Take 0.5-1 tablets (0.25-0.5 mg total) by mouth 2 (two) times daily as needed for anxiety.    Dispense:  60 tablet    Refill:  3    Order Specific Question:   Supervising  Provider    Answer:   Patrici Ranks [id number]  . cholecalciferol (VITAMIN D) 1000 units tablet    Sig: Take 1,000 Units by mouth daily.  Marland Kitchen ALPRAZolam (XANAX) 0.5 MG tablet    Sig: Take 0.5-1 tablets (0.25-0.5 mg total) by mouth 2 (two) times daily as needed for anxiety.    Dispense:  60 tablet    Refill:  3    Order Specific Question:   Supervising Provider    Answer:   Patrici Ranks [id number]    THERAPY PLAN:  NCCN guidelines recommends the following surveillance for invasive breast cancer (2.2017):  A. History and Physical exam 1-4 times per year as clinically appropriate for 5 years, then annually.  B. Periodic screening for changes in family history and referral to genetics counseling as indicated  C. Educate, monitor, and refer to lymphedema management.  D. Mammography every 12 months  E. Routine imaging of reconstructed breast is not indicated.  F. In the absence of clinical signs and symptoms suggestive of recurrent disease, there is no indication for laboratory or imaging studies for metastases screening.  G. Women on Tamoxifen: annual gynecologic assessment every 12 months if uterus is present.  H. Women on aromatase inhibitor or who experience ovarian failure secondary to treatment should have monitoring of bone health with a bone mineral density determination at baseline and periodically thereafter.  I. Assess and encourage adherence to adjuvant endocrine therapy.  J. Evidence suggests that active lifestyle, healthy diet, limited alcohol intake, and achieving and maintaining an ideal body weight (20-25 BMI) may lead to optimal breast cancer outcomes.  All questions were answered. The patient knows to call the clinic with any problems, questions or concerns. We can certainly see the patient much sooner if necessary.  Patient and plan discussed with Dr. Ancil Linsey and she is in agreement with the aforementioned.   This note is electronically signed by:  Doy Mince 12/25/2015 2:17 PM

## 2015-12-25 NOTE — Assessment & Plan Note (Addendum)
Stage I infiltrating ductal carcinoma the breast status post treatment on CALGB protocol 40101. She was randomized to Adriamycin and Cytoxan at a dose dense fashion and treated with Herceptin for 52 weeks finishing therapy as of 07/06/1998. Her cancer was 1.9 cm in size, grade 2, no LV I was seen, ER receptor 0%, PR receptor 0%, HER-2/neu 3+ positive. She is status post left modified radical mastectomy on 02/06/2007.  No oncology role for labs today.  I personally reviewed and went over radiographic studies with the patient.  The results are noted within this dictation.  Mammogram on 01/27/2015 was negative.  She will be due next month for her next mammogram and this is scheduled for 01/29/2016.  I have refilled her Xanax after reviewing the [facility] Controlled Substance Reporting System.  Return in 12 months for follow-up, sooner if needed.

## 2015-12-25 NOTE — Patient Instructions (Addendum)
[place] at [facility] Discharge Instructions  RECOMMENDATIONS MADE BY THE CONSULTANT AND ANY TEST RESULTS WILL BE SENT TO YOUR REFERRING PHYSICIAN.  You were seen today by Kirby Crigler PA-C. Rx for Xanax given today. Mammogram in January. Follow up in 12 months.    Thank you for choosing [place] at [facility] to provide your oncology and hematology care.  To afford each patient quality time with our provider, please arrive at least 15 minutes before your scheduled appointment time.   Beginning January 23rd 2017 lab work for the [facility] will be done in the  Main lab at [facility] on 1st floor. If you have a lab appointment with the [place] please come in thru the  Main Entrance and check in at the main information desk  You need to re-schedule your appointment should you arrive 10 or more minutes late.  We strive to give you quality time with our providers, and arriving late affects you and other patients whose appointments are after yours.  Also, if you no show three or more times for appointments you may be dismissed from the clinic at the providers discretion.     Again, thank you for choosing [facility].  Our hope is that these requests will decrease the amount of time that you wait before being seen by our physicians.       _____________________________________________________________  Should you have questions after your visit to [facility], please contact our office at [phone] between the hours of 8:30 a.m. and 4:30 p.m.  Voicemails left after 4:30 p.m. will not be returned until the following business day.  For prescription refill requests, have your pharmacy contact our office.         Resources For Cancer Patients and their Caregivers ? American Cancer Society: Can assist with transportation, wigs, general needs, runs Look Good Feel Better.        225 326 2229 ? Cancer  Care: Provides financial assistance, online support groups, medication/co-pay assistance.  1-800-813-HOPE 717 507 8378) ? [place] Assists [place] cancer patients and their families through emotional , educational and financial support.  330-434-5933 ? [place] DSS Where to apply for food stamps, Medicaid and utility assistance. 612-425-3700 ? [facility]: Transportation to medical appointments. (224)174-9581 ? Social Security Administration: May apply for disability if have a Stage IV cancer. 781-442-3632 347-090-9863 ? [employer], Disability and Transit Services: Assists with nutrition, care and transit needs. [place] Support Programs: '@10RELATIVEDAYS'$ @ > Cancer Support Group  2nd Tuesday of the month 1pm-2pm, Journey Room  > Creative Journey  3rd Tuesday of the month 1130am-1pm, Journey Room  > Look Good Feel Better  1st Wednesday of the month 10am-12 noon, Journey Room (Call [place] to register [phone])

## 2016-01-29 ENCOUNTER — Ambulatory Visit (HOSPITAL_COMMUNITY)
Admission: RE | Admit: 2016-01-29 | Discharge: 2016-01-29 | Disposition: A | Discharge status: Home / Self Care | Payer: Medicare Other | Source: Ambulatory Visit | Attending: Hematology & Oncology | Admitting: Hematology & Oncology

## 2016-01-29 DIAGNOSIS — Z1231 Encounter for screening mammogram for malignant neoplasm of breast: Secondary | ICD-10-CM

## 2016-06-15 LAB — BASIC METABOLIC PANEL: GLUCOSE: 116

## 2016-07-03 ENCOUNTER — Other Ambulatory Visit (HOSPITAL_COMMUNITY): Payer: Self-pay | Admitting: Adult Health

## 2016-07-03 ENCOUNTER — Encounter (HOSPITAL_COMMUNITY): Payer: Self-pay | Admitting: Adult Health

## 2016-07-03 DIAGNOSIS — F419 Anxiety disorder, unspecified: Secondary | ICD-10-CM

## 2016-07-03 MED ORDER — ALPRAZOLAM 0.5 MG PO TABS: 0.2500 mg | ORAL_TABLET | Freq: Two times a day (BID) | ORAL | 2 refills | Status: DC | PRN

## 2016-07-03 NOTE — Progress Notes (Signed)
Received refill request for Xanax from patient's pharmacy ([facility]).   [place] Controlled Substance Reporting System reviewed and refill is appropriate on or after 07/07/16.  Post-dated prescription printed and will be faxed to pharmacy by nursing.   Mike Craze, NP [place] [phone]

## 2016-11-01 ENCOUNTER — Encounter (HOSPITAL_COMMUNITY): Payer: Self-pay | Admitting: Adult Health

## 2016-11-01 ENCOUNTER — Other Ambulatory Visit (HOSPITAL_COMMUNITY): Payer: Self-pay | Admitting: Adult Health

## 2016-11-01 ENCOUNTER — Telehealth (HOSPITAL_COMMUNITY): Payer: Self-pay | Admitting: *Deleted

## 2016-11-01 DIAGNOSIS — F419 Anxiety disorder, unspecified: Secondary | ICD-10-CM

## 2016-11-01 MED ORDER — ALPRAZOLAM 0.5 MG PO TABS: 0.2500 mg | ORAL_TABLET | Freq: Two times a day (BID) | ORAL | 2 refills | Status: AC | PRN

## 2016-11-01 NOTE — Progress Notes (Signed)
Patient called cancer center requesting refill of Xanax; also received refill request from pharmacy.    Controlled Substance Reporting System reviewed and refill is appropriate on or after 11/02/16. Paper prescription printed & post-dated; I will ask nursing to fax Rx to her pharmacy.   NCCSRS reviewed:     Mike Craze, NP [place] [phone]

## 2016-11-01 NOTE — Telephone Encounter (Signed)
Rx printed. Will have Jaynie Collins, LPN fax Rx.  Mike Craze, NP [place] [phone]

## 2016-12-23 ENCOUNTER — Other Ambulatory Visit (HOSPITAL_COMMUNITY): Payer: Self-pay | Admitting: Internal Medicine

## 2016-12-23 DIAGNOSIS — Z1231 Encounter for screening mammogram for malignant neoplasm of breast: Secondary | ICD-10-CM

## 2016-12-24 ENCOUNTER — Encounter (HOSPITAL_COMMUNITY): Payer: Self-pay | Admitting: Oncology

## 2016-12-24 ENCOUNTER — Other Ambulatory Visit: Payer: Self-pay

## 2016-12-24 ENCOUNTER — Encounter (HOSPITAL_COMMUNITY): Payer: Medicare Other | Attending: Oncology | Admitting: Oncology

## 2016-12-24 VITALS — BP 109/65 | HR 81 | Temp 97.9°F | Resp 16 | Ht 67.0 in | Wt 161.0 lb

## 2016-12-24 DIAGNOSIS — Z853 Personal history of malignant neoplasm of breast: Secondary | ICD-10-CM

## 2016-12-24 DIAGNOSIS — C50912 Malignant neoplasm of unspecified site of left female breast: Secondary | ICD-10-CM

## 2016-12-24 NOTE — Progress Notes (Signed)
Casey Squibb, MD [address] [place] [address]  Infiltrating ductal carcinoma of breast, stage 1, left ([place])  CURRENT THERAPY: Surveillance per NCCN guidelines  INTERVAL HISTORY: Casey Lopez JULY 76 y.o. 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 She was randomized to Adriamycin and Cytoxan at a dose dense fashion and treated with Herceptin for 52 weeks finishing therapy as of 07/06/1998. Her cancer was 1.9 cm in size, grade 2, no LV I was seen, ER receptor 0%, PR receptor 0%, HER-2/neu 3+ positive. She is status post left modified radical mastectomy on 02/06/2007.  Patient presents today for follow up. She states she is doing well and has no complaints. She states she still has anxiety for which she takes xanax. She denies any recent infections, chest pain, shortness of breath, abdominal pain, N/V/D, focal weakness.   Review of Systems  Constitutional: Negative.  Negative for chills, fever and weight loss.  HENT: Negative.   Eyes: Negative.  Negative for double vision.  Respiratory: Negative.  Negative for cough.   Cardiovascular: Negative.  Negative for chest pain.  Gastrointestinal: Negative.  Negative for blood in stool and melena.  Genitourinary: Negative.  Negative for dysuria.  Musculoskeletal: Negative.  Negative for falls.  Skin: Negative.   Neurological: Negative.  Negative for weakness.  Endo/Heme/Allergies: Negative.   Psychiatric/Behavioral: Negative for depression.       Anxiety    Past Medical History:  Diagnosis Date  . Cancer ([place])    left breast  . Depression 12/24/2010  . Goiter   . HTN (hypertension) 12/24/2010  . Hypothyroidism 12/24/2010  . Infiltrating ductal carcinoma of breast, stage 1 ([place]) 12/24/2010  . Sickle cell trait [facility])     Past Surgical History:  Procedure Laterality Date  . ABDOMINAL HYSTERECTOMY    . APPENDECTOMY    . BIOPSY  THYROID    . CATARACT EXTRACTION     right eye with lens implant  . CATARACT EXTRACTION  2008   right  . CATARACT EXTRACTION W/PHACO  04/16/2011   Procedure: CATARACT EXTRACTION PHACO AND INTRAOCULAR LENS PLACEMENT (IOC);  Surgeon: Elta Guadeloupe T. Gershon Crane, MD;  Location: [facility] ORS;  Service: Ophthalmology;  Laterality: Left;  CDE=7.66  . CHOLECYSTECTOMY    . COLON RESECTION  1965   tumor 4X4 inch per patient. ?colon or other per patient  . COLONOSCOPY N/A 08/12/2014   Procedure: COLONOSCOPY;  Surgeon: Danie Binder, MD;  Location: [facility] ENDO SUITE;  Service: Endoscopy;  Laterality: N/A;  1300  . DENTAL SURGERY  11/2011  . HEMORRHOID SURGERY    . MASTECTOMY  2009   left  . TONSILECTOMY, ADENOIDECTOMY, BILATERAL MYRINGOTOMY AND TUBES      Family History  Problem Relation Age of Onset  . Anesthesia problems Neg Hx   . Hypotension Neg Hx   . Malignant hyperthermia Neg Hx   . Pseudochol deficiency Neg Hx   . Breast cancer Sister   . Cancer Mother   . Sickle cell anemia Father     Social History   Socioeconomic History  . Marital status: Widowed    Spouse name: None  . Number of children: 2  . Years of education: None  . Highest education level: None  Social Needs  . Financial resource strain: None  . Food insecurity - worry: None  . Food insecurity - inability: None  . Transportation needs -  medical: None  . Transportation needs - non-medical: None  Occupational History  . None  Tobacco Use  . Smoking status: Never Smoker  . Smokeless tobacco: Never Used  Substance and Sexual Activity  . Alcohol use: No  . Drug use: No  . Sexual activity: Yes    Birth control/protection: Surgical  Other Topics Concern  . None  Social History Narrative  . None     PHYSICAL EXAMINATION  ECOG PERFORMANCE STATUS: 0 - Asymptomatic  Vitals:   12/24/16 1330  BP: 109/65  Pulse: 81  Resp: 16  Temp: 97.9 F (36.6 C)    Constitutional: Well-developed, well-nourished, and in no distress.     HENT:  Head: Normocephalic and atraumatic.  Mouth/Throat: No oropharyngeal exudate. Mucosa moist. Eyes: Pupils are equal, round, and reactive to light. Conjunctivae are normal. No scleral icterus.  Neck: Normal range of motion. Neck supple. No JVD present.  Cardiovascular: Normal rate, regular rhythm and normal heart sounds.  Exam reveals no gallop and no friction rub.   No murmur heard. Pulmonary/Chest: Effort normal and breath sounds normal. No respiratory distress. No wheezes.No rales.  Abdominal: Soft. Bowel sounds are normal. No distension. There is no tenderness. There is no guarding.  Musculoskeletal: No edema or tenderness.  Lymphadenopathy:    No cervical or supraclavicular adenopathy.  Neurological: Alert and oriented to person, place, and time. No cranial nerve deficit.  Skin: Skin is warm and dry. No rash noted. No erythema. No pallor.  Psychiatric: Affect and judgment normal.  Breast: Right breast without masses, nipple discharge, axillary lymphadenopathy. Left chest wall s/p mastectomy. Mastectomy scar well healed. No subcutaneous nodules. No left axillary lymphadenopathy.    LABORATORY DATA: CBC    Component Value Date/Time   WBC 7.2 12/23/2014 1311   RBC 4.73 12/23/2014 1311   HGB 14.4 12/23/2014 1311   HCT 43.2 12/23/2014 1311   PLT 179 12/23/2014 1311   MCV 91.3 12/23/2014 1311   MCH 30.4 12/23/2014 1311   MCHC 33.3 12/23/2014 1311   RDW 12.6 12/23/2014 1311   LYMPHSABS 4.0 12/23/2014 1311   MONOABS 0.4 12/23/2014 1311   EOSABS 0.5 12/23/2014 1311   BASOSABS 0.1 12/23/2014 1311      Chemistry      Component Value Date/Time   NA 140 12/23/2014 1311   K 3.8 12/23/2014 1311   CL 103 12/23/2014 1311   CO2 30 12/23/2014 1311   BUN 17 12/23/2014 1311   CREATININE 0.88 12/23/2014 1311   GLU 116 06/15/2016      Component Value Date/Time   CALCIUM 9.7 12/23/2014 1311   ALKPHOS 88 12/23/2014 1311   AST 27 12/23/2014 1311   ALT 21 12/23/2014 1311    BILITOT 0.3 12/23/2014 1311        ASSESSMENT AND PLAN:  Stage I infiltrating ductal carcinoma the breast status post treatment on CALGB protocol 40101. She was randomized to Adriamycin and Cytoxan at a dose dense fashion and treated with Herceptin for 52 weeks finishing therapy as of 07/06/1998. Her cancer was 1.9 cm in size, grade 2, no LV I was seen, ER receptor 0%, PR receptor 0%, HER-2/neu 3+ positive. She is status post left modified radical mastectomy on 02/06/2007.  -Patient is now almost 10 years out from her mastectomy (will be 10 years out in 02/05/17) -Clinically NED on breast exam today. -Annual right breast mammogram scheduled for 02/03/17. -Since patient is at the 10 year mark, she can be discharged from clinic and follow  up now with her PCP. -I have discussed with her about Dr. Nevada Crane taking over the management of her xanax for her anxiety and she is agreeable.  -RTC PRN.    This note is electronically signed by: Twana First, MD 12/24/2016 1:32 PM

## 2017-02-03 ENCOUNTER — Ambulatory Visit (HOSPITAL_COMMUNITY)
Admission: RE | Admit: 2017-02-03 | Discharge: 2017-02-03 | Disposition: A | Discharge status: Home / Self Care | Payer: Medicare Other | Source: Ambulatory Visit | Attending: Internal Medicine | Admitting: Internal Medicine

## 2017-02-03 ENCOUNTER — Encounter (HOSPITAL_COMMUNITY): Payer: Self-pay

## 2017-02-03 DIAGNOSIS — Z1231 Encounter for screening mammogram for malignant neoplasm of breast: Secondary | ICD-10-CM | POA: Diagnosis not present

## 2017-02-03 DIAGNOSIS — Z9012 Acquired absence of left breast and nipple: Secondary | ICD-10-CM | POA: Insufficient documentation

## 2017-07-04 ENCOUNTER — Emergency Department (HOSPITAL_COMMUNITY)
Admission: EM | Admit: 2017-07-04 | Discharge: 2017-07-05 | Disposition: A | Discharge status: Home / Self Care | Payer: Medicare Other | Attending: Emergency Medicine | Admitting: Emergency Medicine

## 2017-07-04 ENCOUNTER — Other Ambulatory Visit: Payer: Self-pay

## 2017-07-04 ENCOUNTER — Encounter (HOSPITAL_COMMUNITY): Payer: Self-pay | Admitting: *Deleted

## 2017-07-04 DIAGNOSIS — R0981 Nasal congestion: Secondary | ICD-10-CM | POA: Insufficient documentation

## 2017-07-04 DIAGNOSIS — E860 Dehydration: Secondary | ICD-10-CM | POA: Insufficient documentation

## 2017-07-04 DIAGNOSIS — Z79899 Other long term (current) drug therapy: Secondary | ICD-10-CM | POA: Diagnosis not present

## 2017-07-04 DIAGNOSIS — I10 Essential (primary) hypertension: Secondary | ICD-10-CM | POA: Diagnosis not present

## 2017-07-04 DIAGNOSIS — E039 Hypothyroidism, unspecified: Secondary | ICD-10-CM | POA: Diagnosis not present

## 2017-07-04 DIAGNOSIS — N390 Urinary tract infection, site not specified: Secondary | ICD-10-CM | POA: Insufficient documentation

## 2017-07-04 LAB — BASIC METABOLIC PANEL
ANION GAP: 9 (ref 5–15)
BUN: 26 mg/dL — ABNORMAL HIGH (ref 8–23)
CO2: 29 mmol/L (ref 22–32)
Calcium: 9.1 mg/dL (ref 8.9–10.3)
Chloride: 102 mmol/L (ref 98–111)
Creatinine, Ser: 1.38 mg/dL — ABNORMAL HIGH (ref 0.44–1.00)
GFR calc non Af Amer: 36 mL/min — ABNORMAL LOW (ref >60)
GFR, EST AFRICAN AMERICAN: 42 mL/min — AB (ref >60)
Glucose, Bld: 86 mg/dL (ref 70–99)
Potassium: 4.2 mmol/L (ref 3.5–5.1)
Sodium: 140 mmol/L (ref 135–145)

## 2017-07-04 LAB — CBC WITH DIFFERENTIAL/PLATELET
BASOS PCT: 0 %
Basophils Absolute: 0 10*3/uL (ref 0.0–0.1)
Eosinophils Absolute: 0.2 10*3/uL (ref 0.0–0.7)
Eosinophils Relative: 2 %
HEMATOCRIT: 33.5 % — AB (ref 36.0–46.0)
HEMOGLOBIN: 10.9 g/dL — AB (ref 12.0–15.0)
LYMPHS ABS: 2.7 10*3/uL (ref 0.7–4.0)
Lymphocytes Relative: 29 %
MCH: 29.7 pg (ref 26.0–34.0)
MCHC: 32.5 g/dL (ref 30.0–36.0)
MCV: 91.3 fL (ref 78.0–100.0)
Monocytes Absolute: 0.6 10*3/uL (ref 0.1–1.0)
Monocytes Relative: 7 %
NEUTROS ABS: 5.8 10*3/uL (ref 1.7–7.7)
NEUTROS PCT: 62 %
Platelets: 234 10*3/uL (ref 150–400)
RBC: 3.67 MIL/uL — ABNORMAL LOW (ref 3.87–5.11)
RDW: 12.9 % (ref 11.5–15.5)
WBC: 9.3 10*3/uL (ref 4.0–10.5)

## 2017-07-04 LAB — URINALYSIS, ROUTINE W REFLEX MICROSCOPIC
Bilirubin Urine: NEGATIVE
GLUCOSE, UA: NEGATIVE mg/dL
Hgb urine dipstick: NEGATIVE
KETONES UR: 5 mg/dL — AB
NITRITE: NEGATIVE
PROTEIN: NEGATIVE mg/dL
Specific Gravity, Urine: 1.012 (ref 1.005–1.030)
pH: 5 (ref 5.0–8.0)

## 2017-07-04 MED ORDER — SODIUM CHLORIDE 0.9 % IV SOLN: 1000.0000 mL | INTRAVENOUS | Status: DC

## 2017-07-04 MED ORDER — SODIUM CHLORIDE 0.9 % IV BOLUS (SEPSIS)
1000.0000 mL | Freq: Once | INTRAVENOUS | Status: AC
  Administered 2017-07-05: 1000 mL via INTRAVENOUS

## 2017-07-04 NOTE — ED Triage Notes (Signed)
Pt reports nasal/sinus congestion on and off for about 3 weeks. She saw Dr. Nevada Crane on last Thursday who prescribed abx, she felt somewhat better, but now not feeling well again. Denies any fevers, has been taking allegra and Flonase in addition to abx.

## 2017-07-04 NOTE — ED Provider Notes (Signed)
[facility] EMERGENCY DEPARTMENT Provider Note   CSN: [id number] Arrival date & time: 07/04/17  1919     History   Chief Complaint Chief Complaint  Patient presents with  . Nasal Congestion    HPI Casey Lopez is a 77 y.o. female.  Patient is a 77 year old female who presents to the emergency department with a complaint of nasal congestion and weakness.  Patient states she has been dealing with this issue for about 3 weeks.  She has been seen by Dr. Nevada Crane in the office.  Her last visit was about 1 week ago.  At that time she was prescribed antibiotics and given a steroid injection.  She says she feels somewhat better, for a while but now she is beginning to have more tightness in her nasal passages, and a sensation of feeling weak all over.  Patient denies having any fever.  She noticed a mild amount of blood-tinged mucus on her tissue with trying to blow her nose, but she states that she was blowing rather violently to try to clear her nasal passage.  She has been taking Allegra and Flonase, and states she has been increasing her fluids.  She denies chest pain or palpitations.     Past Medical History:  Diagnosis Date  . Cancer ([place])    left breast  . Depression 12/24/2010  . Goiter   . HTN (hypertension) 12/24/2010  . Hypothyroidism 12/24/2010  . Infiltrating ductal carcinoma of breast, stage 1 ([place]) 12/24/2010  . Sickle cell trait [facility])     Patient Active Problem List   Diagnosis Date Noted  . Hx of colonic polyps   . History of colon polyps 08/01/2014  . Elevated CEA 08/01/2014  . Infiltrating ductal carcinoma of breast, stage 1 ([place]) 12/24/2010  . Depression 12/24/2010  . Hypothyroidism 12/24/2010  . HTN (hypertension) 12/24/2010    Past Surgical History:  Procedure Laterality Date  . ABDOMINAL HYSTERECTOMY    . APPENDECTOMY    . BIOPSY THYROID    . CATARACT EXTRACTION     right eye with lens implant  . CATARACT EXTRACTION  2008   right  . CATARACT  EXTRACTION W/PHACO  04/16/2011   Procedure: CATARACT EXTRACTION PHACO AND INTRAOCULAR LENS PLACEMENT (IOC);  Surgeon: Elta Guadeloupe T. Gershon Crane, MD;  Location: [facility] ORS;  Service: Ophthalmology;  Laterality: Left;  CDE=7.66  . CHOLECYSTECTOMY    . COLON RESECTION  1965   tumor 4X4 inch per patient. ?colon or other per patient  . COLONOSCOPY N/A 08/12/2014   Procedure: COLONOSCOPY;  Surgeon: Danie Binder, MD;  Location: [facility] ENDO SUITE;  Service: Endoscopy;  Laterality: N/A;  1300  . DENTAL SURGERY  11/2011  . HEMORRHOID SURGERY    . MASTECTOMY  2009   left  . TONSILECTOMY, ADENOIDECTOMY, BILATERAL MYRINGOTOMY AND TUBES       OB History   None      Home Medications    Prior to Admission medications   Medication Sig Start Date End Date Taking? Authorizing Provider  ALPRAZolam Duanne Moron) 0.5 MG tablet Take 0.5-1 tablets (0.25-0.5 mg total) by mouth 2 (two) times daily as needed for anxiety. 11/01/16   Holley Bouche, NP  cetirizine (ZYRTEC) 10 MG tablet Take 10 mg by mouth daily as needed for allergies. Only as needed    [provider]  K-DUR 20 MEQ tablet TAKE (1) TABLET BY MOUTH ONCE DAILY. 08/14/11   Baird Cancer, PA-C  levothyroxine (SYNTHROID, LEVOTHROID) 50 MCG tablet Take  50 mcg by mouth daily.      [provider]  lisinopril-hydrochlorothiazide (PRINZIDE,ZESTORETIC) 20-12.5 MG per tablet Take 1 tablet by mouth daily. 06/10/14   [provider]  propranolol ER (INDERAL LA) 80 MG 24 hr capsule Take 1 capsule by mouth daily. 06/10/14   [provider]  simvastatin (ZOCOR) 40 MG tablet Take 40 mg by mouth at bedtime.  12/16/11   [provider]    Family History Family History  Problem Relation Age of Onset  . Breast cancer Sister   . Cancer Mother   . Sickle cell anemia Father   . Anesthesia problems Neg Hx   . Hypotension Neg Hx   . Malignant hyperthermia Neg Hx   . Pseudochol deficiency Neg Hx     Social History Social History    Tobacco Use  . Smoking status: Never Smoker  . Smokeless tobacco: Never Used  Substance Use Topics  . Alcohol use: No  . Drug use: No     Allergies   Sulfa antibiotics; Feldene [piroxicam]; and Penicillins   Review of Systems Review of Systems  Constitutional: Positive for appetite change and fatigue. Negative for activity change and fever.       All ROS Neg except as noted in HPI  HENT: Positive for congestion, postnasal drip, sinus pressure and sinus pain. Negative for nosebleeds.   Eyes: Negative for photophobia and discharge.  Respiratory: Negative for cough, shortness of breath and wheezing.   Cardiovascular: Negative for chest pain and palpitations.  Gastrointestinal: Negative for abdominal pain and blood in stool.  Genitourinary: Negative for dysuria, frequency and hematuria.  Musculoskeletal: Negative for arthralgias, back pain and neck pain.  Skin: Negative.   Neurological: Negative for dizziness, seizures and speech difficulty.  Psychiatric/Behavioral: Negative for confusion and hallucinations.     Physical Exam Updated Vital Signs BP (!) 123/49 (BP Location: Right Arm)   Pulse 92   Temp (!) 97.5 F (36.4 C) (Oral)   Resp 16   Ht 5\' 7"  (1.702 m)   Wt 69.9 kg (154 lb)   SpO2 100%   BMI 24.12 kg/m   Physical Exam  Constitutional: She is oriented to person, place, and time. She appears well-developed and well-nourished.  Non-toxic appearance.  HENT:  Head: Normocephalic.  Right Ear: Tympanic membrane and external ear normal.  Left Ear: Tympanic membrane and external ear normal.  Nasal congestion present.  No mastoid involvement.  The oropharynx is clear.  Eyes: Pupils are equal, round, and reactive to light. EOM and lids are normal.  Neck: Normal range of motion. Neck supple. Carotid bruit is not present.  Cardiovascular: Normal rate, regular rhythm, normal heart sounds, intact distal pulses and normal pulses.  Pulmonary/Chest: Breath sounds normal. No  respiratory distress.  Abdominal: Soft. Bowel sounds are normal. There is no tenderness. There is no guarding.  Musculoskeletal: Normal range of motion.  Lymphadenopathy:       Head (right side): No submandibular adenopathy present.       Head (left side): No submandibular adenopathy present.    She has no cervical adenopathy.  Neurological: She is alert and oriented to person, place, and time. She has normal strength. No cranial nerve deficit or sensory deficit. Coordination normal.  Patient ambulatory without problem.  No gross neurologic deficits appreciated.  Skin: Skin is warm and dry.  Psychiatric: She has a normal mood and affect. Her speech is normal.  Nursing note and vitals reviewed.    ED Treatments /  Results  Labs (all labs ordered are listed, but only abnormal results are displayed) Labs Reviewed - No data to display  EKG None  Radiology No results found.  Procedures Procedures (including critical care time)  Medications Ordered in ED Medications - No data to display   Initial Impression / Assessment and Plan / ED Course  I have reviewed the triage vital signs and the nursing notes.  Pertinent labs & imaging results that were available during my care of the patient were reviewed by me and considered in my medical decision making (see chart for details).       Final Clinical Impressions(s) / ED Diagnoses MDM  Vital signs reviewed. Nasal congestion and intermittent sinus pressure still present. While in the emergency department, the blood pressure went down to 100/40 and then to 95/52.  Attempts were made for oral hydration, but later the IV hydration was started. The basic metabolic panel is within normal limits with exception of the BUN being 26, and the creatinine elevated at 1.38.  The glomerular filtration rate is low at 42.  The anion gap is normal at 9.  Complete blood count is nonacute.  Patient tolerating hydration without problem.  Patient has  a urinary tract infection, oral Keflex has been started.  Culture has been sent to the lab.  Patient continues to tolerate hydration without problem.  She states she is beginning to feel much better.  Patient ambulated in the room and she feels stronger.  Patient to be discharged home with instructions to increase fluids.  She will be treated with Keflex for her urinary tract infection.  She is to follow-up with Dr. Nevada Crane in the office to have the urine rechecked, and also to reassess her generalized weakness.  Patient is in agreement with this plan.   Final diagnoses:  Nasal congestion  Urinary tract infection without hematuria, site unspecified  Dehydration    ED Discharge Orders        Ordered    cephALEXin (KEFLEX) 500 MG capsule  4 times daily     07/05/17 0122       Lily Kocher, PA-C 07/05/17 8648    Virgel Manifold, MD 07/05/17 2357

## 2017-07-04 NOTE — ED Notes (Signed)
edp notified of pt's bp, given vo to hydrate orally.  Pt given 2 cups of water, peanut butter and crackers.

## 2017-07-05 MED ORDER — CEPHALEXIN 500 MG PO CAPS: 500.0000 mg | ORAL_CAPSULE | Freq: Four times a day (QID) | ORAL | 0 refills | Status: DC

## 2017-07-05 NOTE — Discharge Instructions (Signed)
Your tests suggest a urinary tract infection, dehydration, accompanying your nasal congestion.  Please continue your current nasal congestion medications and antibiotics.  Please increase water, Gatorade, popsicles.  Please use Keflex 4 times daily with meals and at bedtime.  Please have your urine rechecked in 7 to 10 days to confirm resolution of this infection.  Please return to the emergency department if any changes in your condition, problems, or concerns.

## 2017-07-22 ENCOUNTER — Emergency Department (HOSPITAL_COMMUNITY): Payer: Medicare Other

## 2017-07-22 ENCOUNTER — Encounter (HOSPITAL_COMMUNITY): Payer: Self-pay | Admitting: Emergency Medicine

## 2017-07-22 ENCOUNTER — Other Ambulatory Visit: Payer: Self-pay

## 2017-07-22 ENCOUNTER — Inpatient Hospital Stay (HOSPITAL_COMMUNITY)
Admission: EM | Admit: 2017-07-22 | Discharge: 2017-07-26 | DRG: 378 | Disposition: A | Discharge status: Home / Self Care | Payer: Medicare Other | Attending: Internal Medicine | Admitting: Internal Medicine

## 2017-07-22 DIAGNOSIS — J392 Other diseases of pharynx: Secondary | ICD-10-CM

## 2017-07-22 DIAGNOSIS — C119 Malignant neoplasm of nasopharynx, unspecified: Secondary | ICD-10-CM | POA: Diagnosis present

## 2017-07-22 DIAGNOSIS — R110 Nausea: Secondary | ICD-10-CM | POA: Diagnosis present

## 2017-07-22 DIAGNOSIS — Z9841 Cataract extraction status, right eye: Secondary | ICD-10-CM | POA: Diagnosis not present

## 2017-07-22 DIAGNOSIS — E038 Other specified hypothyroidism: Secondary | ICD-10-CM | POA: Diagnosis not present

## 2017-07-22 DIAGNOSIS — R9389 Abnormal findings on diagnostic imaging of other specified body structures: Secondary | ICD-10-CM | POA: Diagnosis not present

## 2017-07-22 DIAGNOSIS — Z888 Allergy status to other drugs, medicaments and biological substances status: Secondary | ICD-10-CM

## 2017-07-22 DIAGNOSIS — Z79890 Hormone replacement therapy (postmenopausal): Secondary | ICD-10-CM | POA: Diagnosis not present

## 2017-07-22 DIAGNOSIS — R935 Abnormal findings on diagnostic imaging of other abdominal regions, including retroperitoneum: Secondary | ICD-10-CM | POA: Diagnosis not present

## 2017-07-22 DIAGNOSIS — Z9012 Acquired absence of left breast and nipple: Secondary | ICD-10-CM

## 2017-07-22 DIAGNOSIS — Z853 Personal history of malignant neoplasm of breast: Secondary | ICD-10-CM

## 2017-07-22 DIAGNOSIS — K921 Melena: Secondary | ICD-10-CM | POA: Diagnosis present

## 2017-07-22 DIAGNOSIS — D649 Anemia, unspecified: Secondary | ICD-10-CM | POA: Diagnosis not present

## 2017-07-22 DIAGNOSIS — Z79899 Other long term (current) drug therapy: Secondary | ICD-10-CM | POA: Diagnosis not present

## 2017-07-22 DIAGNOSIS — E041 Nontoxic single thyroid nodule: Secondary | ICD-10-CM | POA: Diagnosis present

## 2017-07-22 DIAGNOSIS — Z9842 Cataract extraction status, left eye: Secondary | ICD-10-CM | POA: Diagnosis not present

## 2017-07-22 DIAGNOSIS — R918 Other nonspecific abnormal finding of lung field: Secondary | ICD-10-CM

## 2017-07-22 DIAGNOSIS — K922 Gastrointestinal hemorrhage, unspecified: Principal | ICD-10-CM | POA: Diagnosis present

## 2017-07-22 DIAGNOSIS — N390 Urinary tract infection, site not specified: Secondary | ICD-10-CM | POA: Diagnosis present

## 2017-07-22 DIAGNOSIS — F329 Major depressive disorder, single episode, unspecified: Secondary | ICD-10-CM | POA: Diagnosis present

## 2017-07-22 DIAGNOSIS — K449 Diaphragmatic hernia without obstruction or gangrene: Secondary | ICD-10-CM | POA: Diagnosis present

## 2017-07-22 DIAGNOSIS — Z803 Family history of malignant neoplasm of breast: Secondary | ICD-10-CM

## 2017-07-22 DIAGNOSIS — I10 Essential (primary) hypertension: Secondary | ICD-10-CM | POA: Diagnosis present

## 2017-07-22 DIAGNOSIS — E039 Hypothyroidism, unspecified: Secondary | ICD-10-CM | POA: Diagnosis present

## 2017-07-22 DIAGNOSIS — K222 Esophageal obstruction: Secondary | ICD-10-CM | POA: Diagnosis present

## 2017-07-22 DIAGNOSIS — R634 Abnormal weight loss: Secondary | ICD-10-CM | POA: Diagnosis present

## 2017-07-22 DIAGNOSIS — D62 Acute posthemorrhagic anemia: Secondary | ICD-10-CM | POA: Diagnosis present

## 2017-07-22 DIAGNOSIS — Z961 Presence of intraocular lens: Secondary | ICD-10-CM | POA: Diagnosis present

## 2017-07-22 DIAGNOSIS — C799 Secondary malignant neoplasm of unspecified site: Secondary | ICD-10-CM | POA: Diagnosis present

## 2017-07-22 DIAGNOSIS — Z7984 Long term (current) use of oral hypoglycemic drugs: Secondary | ICD-10-CM

## 2017-07-22 DIAGNOSIS — R930 Abnormal findings on diagnostic imaging of skull and head, not elsewhere classified: Secondary | ICD-10-CM | POA: Diagnosis not present

## 2017-07-22 DIAGNOSIS — D573 Sickle-cell trait: Secondary | ICD-10-CM | POA: Diagnosis present

## 2017-07-22 DIAGNOSIS — Z880 Allergy status to penicillin: Secondary | ICD-10-CM

## 2017-07-22 DIAGNOSIS — Z86010 Personal history of colonic polyps: Secondary | ICD-10-CM

## 2017-07-22 DIAGNOSIS — F32A Depression, unspecified: Secondary | ICD-10-CM | POA: Diagnosis present

## 2017-07-22 DIAGNOSIS — Z832 Family history of diseases of the blood and blood-forming organs and certain disorders involving the immune mechanism: Secondary | ICD-10-CM

## 2017-07-22 DIAGNOSIS — Z6823 Body mass index (BMI) 23.0-23.9, adult: Secondary | ICD-10-CM

## 2017-07-22 DIAGNOSIS — Z882 Allergy status to sulfonamides status: Secondary | ICD-10-CM

## 2017-07-22 LAB — CBC WITH DIFFERENTIAL/PLATELET
BASOS ABS: 0 10*3/uL (ref 0.0–0.1)
Basophils Relative: 0 %
EOS ABS: 0.2 10*3/uL (ref 0.0–0.7)
Eosinophils Relative: 2 %
HCT: 23.9 % — ABNORMAL LOW (ref 36.0–46.0)
Hemoglobin: 7.7 g/dL — ABNORMAL LOW (ref 12.0–15.0)
Lymphocytes Relative: 28 %
Lymphs Abs: 3.7 10*3/uL (ref 0.7–4.0)
MCH: 29.2 pg (ref 26.0–34.0)
MCHC: 32.2 g/dL (ref 30.0–36.0)
MCV: 90.5 fL (ref 78.0–100.0)
MONOS PCT: 8 %
Monocytes Absolute: 1.1 10*3/uL — ABNORMAL HIGH (ref 0.1–1.0)
NEUTROS ABS: 8.3 10*3/uL — AB (ref 1.7–7.7)
NEUTROS PCT: 62 %
PLATELETS: 386 10*3/uL (ref 150–400)
RBC: 2.64 MIL/uL — AB (ref 3.87–5.11)
RDW: 13.7 % (ref 11.5–15.5)
WBC: 13.3 10*3/uL — AB (ref 4.0–10.5)

## 2017-07-22 LAB — COMPREHENSIVE METABOLIC PANEL
ALK PHOS: 58 U/L (ref 38–126)
ALT: 13 U/L (ref 0–44)
ANION GAP: 8 (ref 5–15)
AST: 17 U/L (ref 15–41)
Albumin: 2.5 g/dL — ABNORMAL LOW (ref 3.5–5.0)
BUN: 22 mg/dL (ref 8–23)
CALCIUM: 8.4 mg/dL — AB (ref 8.9–10.3)
CO2: 28 mmol/L (ref 22–32)
CREATININE: 1.16 mg/dL — AB (ref 0.44–1.00)
Chloride: 103 mmol/L (ref 98–111)
GFR, EST AFRICAN AMERICAN: 51 mL/min — AB (ref >60)
GFR, EST NON AFRICAN AMERICAN: 44 mL/min — AB (ref >60)
Glucose, Bld: 92 mg/dL (ref 70–99)
Potassium: 4.3 mmol/L (ref 3.5–5.1)
SODIUM: 139 mmol/L (ref 135–145)
TOTAL PROTEIN: 5.8 g/dL — AB (ref 6.5–8.1)
Total Bilirubin: 0.4 mg/dL (ref 0.3–1.2)

## 2017-07-22 LAB — URINALYSIS, ROUTINE W REFLEX MICROSCOPIC
Bilirubin Urine: NEGATIVE
GLUCOSE, UA: NEGATIVE mg/dL
Hgb urine dipstick: NEGATIVE
Ketones, ur: NEGATIVE mg/dL
Nitrite: NEGATIVE
PH: 5 (ref 5.0–8.0)
Protein, ur: NEGATIVE mg/dL
Specific Gravity, Urine: 1.016 (ref 1.005–1.030)

## 2017-07-22 LAB — POC OCCULT BLOOD, ED: FECAL OCCULT BLD: POSITIVE — AB

## 2017-07-22 LAB — PREPARE RBC (CROSSMATCH)

## 2017-07-22 MED ORDER — SODIUM CHLORIDE 0.9 % IV BOLUS
1000.0000 mL | Freq: Once | INTRAVENOUS | Status: AC
  Administered 2017-07-22: 1000 mL via INTRAVENOUS

## 2017-07-22 MED ORDER — MORPHINE SULFATE (PF) 4 MG/ML IV SOLN
4.0000 mg | Freq: Once | INTRAVENOUS | Status: AC
  Administered 2017-07-22: 4 mg via INTRAVENOUS
  Filled 2017-07-22: qty 1

## 2017-07-22 MED ORDER — SODIUM CHLORIDE 0.9 % IV SOLN
10.0000 mL/h | Freq: Once | INTRAVENOUS | Status: AC
  Administered 2017-07-23: 10 mL/h via INTRAVENOUS

## 2017-07-22 MED ORDER — IOHEXOL 300 MG/ML  SOLN
75.0000 mL | Freq: Once | INTRAMUSCULAR | Status: AC | PRN
  Administered 2017-07-22: 75 mL via INTRAVENOUS

## 2017-07-22 MED ORDER — PANTOPRAZOLE SODIUM 40 MG IV SOLR: 40.0000 mg | Freq: Two times a day (BID) | INTRAVENOUS | Status: DC

## 2017-07-22 MED ORDER — ONDANSETRON HCL 4 MG/2ML IJ SOLN
4.0000 mg | Freq: Once | INTRAMUSCULAR | Status: AC
  Administered 2017-07-22: 4 mg via INTRAVENOUS
  Filled 2017-07-22: qty 2

## 2017-07-22 MED ORDER — SODIUM CHLORIDE 0.9 % IV SOLN
8.0000 mg/h | INTRAVENOUS | Status: DC
  Administered 2017-07-23 – 2017-07-24 (×3): 8 mg/h via INTRAVENOUS
  Filled 2017-07-22 (×5): qty 80
  Filled 2017-07-22: qty 40
  Filled 2017-07-22 (×3): qty 80

## 2017-07-22 MED ORDER — SODIUM CHLORIDE 0.9 % IV SOLN
80.0000 mg | Freq: Once | INTRAVENOUS | Status: AC
  Administered 2017-07-23: 80 mg via INTRAVENOUS
  Filled 2017-07-22: qty 80

## 2017-07-22 NOTE — H&P (Signed)
History and Physical    Casey Lopez:[id number] DOB: Jan 01, 1941 DOA: 07/22/2017  PCP: [name], MD   Patient coming from: Home.  I have personally briefly reviewed patient's old medical records in [place]  Chief Complaint: Weakness and sinus pressure.  HPI: Casey Lopez is a 77 y.o. female with medical history significant of breast cancer, depression, goiter, hypothyroidism, hypertension, sickle cell trait who is coming to the emergency department with complaints of progressively worse weakness for about 2 weeks since she was diagnosed with a sinus infection and a UTI at the end of last month.  She still complains of headache and stated that she has had melena for the past few days associated with fatigue, postural dizziness and weakness.  She denies using OTC or prescribed NSAIDs.  Denies recent fever, but complains of chills.  No sore throat, chest pain, dyspnea, palpitations, diaphoresis, PND, orthopnea or pitting edema lower extremities.  No abdominal pain, nausea, emesis, diarrhea or constipation.  Denies dysuria, frequency or hematuria.  No heat or cold intolerance.  No polyuria, polydipsia, polyphagia or blurred vision.  ED Course: Initial vital signs temperature 97.9 F, pulse 92, respirations 17, blood pressure 115/40 mmHg and O2 sat 93% on room air.  Urinalysis showed large leukocyte esterase, 6-10 RBC and 21-50 WBC with rare bacteria.  Fecal occult blood was positive.  Her CBC showed WBC of 13.3 with 62% neutrophils, 28% lymphocytes and 8% monocytes, hemoglobin 7.7 (decreased from 10.9 g/dL on 07/04/2017) and platelets 366. CMP shows a creatinine of 1.16, calcium of 8.4 milligrams per deciliter.  Total protein 5.8 and albumin 2.5 g/dL.  Imaging: CT sinuses showed posterior nasopharyngeal soft tissue fullness extending to the posterior aspect of the right nasal cavity, there is bilateral level 2 adenopathy also raising concern for malignancy among other findings.   Please see images and radiology report for further detail. CT chest and CT abdomen/pelvis shows with contrast left upper lobe pleural-based mass, multiple pulmonary nodules that may represent metastatic disease or atypical infection, small bowel findings suspicious for lymphoma.  Please see images and full radiology report for further detail.  Review of Systems: As per HPI otherwise 10 point review of systems negative.   Past Medical History:  Diagnosis Date  . Cancer ([place])    left breast  . Depression 12/24/2010  . Goiter   . HTN (hypertension) 12/24/2010  . Hypothyroidism 12/24/2010  . Infiltrating ductal carcinoma of breast, stage 1 ([place]) 12/24/2010  . Sickle cell trait [facility])     Past Surgical History:  Procedure Laterality Date  . ABDOMINAL HYSTERECTOMY    . APPENDECTOMY    . BIOPSY THYROID    . CATARACT EXTRACTION     right eye with lens implant  . CATARACT EXTRACTION  2008   right  . CATARACT EXTRACTION W/PHACO  04/16/2011   Procedure: CATARACT EXTRACTION PHACO AND INTRAOCULAR LENS PLACEMENT (IOC);  Surgeon: Elta Guadeloupe T. Gershon Crane, MD;  Location: [facility] ORS;  Service: Ophthalmology;  Laterality: Left;  CDE=7.66  . CHOLECYSTECTOMY    . COLON RESECTION  1965   tumor 4X4 inch per patient. ?colon or other per patient  . COLONOSCOPY N/A 08/12/2014   Procedure: COLONOSCOPY;  Surgeon: Danie Binder, MD;  Location: [facility] ENDO SUITE;  Service: Endoscopy;  Laterality: N/A;  1300  . DENTAL SURGERY  11/2011  . HEMORRHOID SURGERY    . MASTECTOMY  2009   left  . TONSILECTOMY, ADENOIDECTOMY, BILATERAL MYRINGOTOMY AND TUBES  reports that she has never smoked. She has never used smokeless tobacco. She reports that she does not drink alcohol or use drugs.  Allergies  Allergen Reactions  . Sulfa Antibiotics Swelling  . Feldene [Piroxicam]   . Penicillins Rash    Has patient had a PCN reaction causing immediate rash, facial/tongue/throat swelling, SOB or lightheadedness with hypotension: No Has  patient had a PCN reaction causing severe rash involving mucus membranes or skin necrosis: No Has patient had a PCN reaction that required hospitalization: No Has patient had a PCN reaction occurring within the last 10 years: No If all of the above answers are "NO", then may proceed with Cephalosporin use.     Family History  Problem Relation Age of Onset  . Breast cancer Sister   . Cancer Mother   . Sickle cell anemia Father   . Anesthesia problems Neg Hx   . Hypotension Neg Hx   . Malignant hyperthermia Neg Hx   . Pseudochol deficiency Neg Hx    Prior to Admission medications   Medication Sig Start Date End Date Taking? Authorizing Provider  ALPRAZolam Duanne Moron) 0.5 MG tablet Take 0.5-1 tablets (0.25-0.5 mg total) by mouth 2 (two) times daily as needed for anxiety. 11/01/16  Yes Holley Bouche, NP  cetirizine (ZYRTEC) 10 MG tablet Take 10 mg by mouth daily as needed for allergies. Only as needed   Yes [provider]  Cholecalciferol (VITAMIN D PO) Take 1 capsule by mouth daily.   Yes [provider]  K-DUR 20 MEQ tablet TAKE (1) TABLET BY MOUTH ONCE DAILY. 08/14/11  Yes Kefalas, Manon Hilding, PA-C  levothyroxine (SYNTHROID, LEVOTHROID) 50 MCG tablet Take 50 mcg by mouth daily.     Yes [provider]  metFORMIN (GLUCOPHAGE-XR) 500 MG 24 hr tablet Take 500 mg by mouth every evening.  06/27/17  Yes [provider]  propranolol ER (INDERAL LA) 80 MG 24 hr capsule Take 1 capsule by mouth daily. 06/10/14  Yes [provider]  simvastatin (ZOCOR) 40 MG tablet Take 40 mg by mouth at bedtime.  12/16/11  Yes [provider]  cephALEXin (KEFLEX) 500 MG capsule Take 1 capsule (500 mg total) by mouth 4 (four) times daily. Patient not taking: Reported on 07/22/2017 07/05/17   Lily Kocher, PA-C  lisinopril-hydrochlorothiazide (PRINZIDE,ZESTORETIC) 20-12.5 MG per tablet Take 1 tablet by mouth daily. 06/10/14   [provider]    Physical  Exam: Vitals:   07/22/17 1921 07/22/17 2203 07/22/17 2203 07/22/17 2230  BP:  (!) 112/46  (!) 106/49  Pulse:  86    Resp:  15    Temp:   97.8 F (36.6 C)   TempSrc:   Oral   SpO2:  97%    Weight: 69.9 kg (154 lb)     Height: 5\' 7"  (1.702 m)       Constitutional: Looks chronically ill, but in NAD, calm, comfortable Eyes: PERRL, lids and conjunctivae are pale ENMT: Positive maxillary sinuses pain on percussion.  Mucous membranes are moist. Posterior pharynx clear of any exudate or lesions. Neck: normal, supple, no masses, no thyromegaly Respiratory: clear to auscultation bilaterally, no wheezing, no crackles. Normal respiratory effort. No accessory muscle use.  Cardiovascular: Regular rate and rhythm, no murmurs / rubs / gallops. No extremity edema. 2+ pedal pulses. No carotid bruits.  Abdomen: no tenderness, no masses palpated. No hepatosplenomegaly. Bowel sounds positive.  Musculoskeletal: no clubbing / cyanosis. Good ROM, no contractures. Normal muscle tone.  Skin:  no rashes, lesions, ulcers on limited examination. Neurologic: CN 2-12 grossly intact. Sensation intact, DTR normal. Strength 5/5 in all 4.  Psychiatric: Normal judgment and insight. Alert and oriented x 4.     Labs on Admission: I have personally reviewed following labs and imaging studies  CBC: Recent Labs  Lab 07/22/17 2153  WBC 13.3*  NEUTROABS 8.3*  HGB 7.7*  HCT 23.9*  MCV 90.5  PLT 376   Basic Metabolic Panel: Recent Labs  Lab 07/22/17 2153  NA 139  K 4.3  CL 103  CO2 28  GLUCOSE 92  BUN 22  CREATININE 1.16*  CALCIUM 8.4*   GFR: Estimated Creatinine Clearance: 39.5 mL/min (A) (by C-G formula based on SCr of 1.16 mg/dL (H)). Liver Function Tests: Recent Labs  Lab 07/22/17 2153  AST 17  ALT 13  ALKPHOS 58  BILITOT 0.4  PROT 5.8*  ALBUMIN 2.5*   No results for input(s): LIPASE, AMYLASE in the last 168 hours. No results for input(s): AMMONIA in the last 168 hours. Coagulation  Profile: No results for input(s): INR, PROTIME in the last 168 hours. Cardiac Enzymes: No results for input(s): CKTOTAL, CKMB, CKMBINDEX, TROPONINI in the last 168 hours. BNP (last 3 results) No results for input(s): PROBNP in the last 8760 hours. HbA1C: No results for input(s): HGBA1C in the last 72 hours. CBG: No results for input(s): GLUCAP in the last 168 hours. Lipid Profile: No results for input(s): CHOL, HDL, LDLCALC, TRIG, CHOLHDL, LDLDIRECT in the last 72 hours. Thyroid Function Tests: No results for input(s): TSH, T4TOTAL, FREET4, T3FREE, THYROIDAB in the last 72 hours. Anemia Panel: No results for input(s): VITAMINB12, FOLATE, FERRITIN, TIBC, IRON, RETICCTPCT in the last 72 hours. Urine analysis:    Component Value Date/Time   COLORURINE YELLOW 07/22/2017 2050   APPEARANCEUR HAZY (A) 07/22/2017 2050   LABSPEC 1.016 07/22/2017 2050   PHURINE 5.0 07/22/2017 2050   GLUCOSEU NEGATIVE 07/22/2017 2050   [place] NEGATIVE 07/22/2017 2050   [place] NEGATIVE 07/22/2017 2050   [place] 07/22/2017 2050   PROTEINUR NEGATIVE 07/22/2017 2050   NITRITE NEGATIVE 07/22/2017 2050   LEUKOCYTESUR LARGE (A) 07/22/2017 2050    Radiological Exams on Admission: Dg Chest 2 View  Result Date: 07/22/2017 CLINICAL DATA:  Weakness. Sinus infection. History of breast cancer. EXAM: CHEST - 2 VIEW COMPARISON:  Chest radiograph April 08, 2007 FINDINGS: Homogeneously dense 4.5 cm LEFT upper lobe mass, possible pleural based. No pleural effusion. Mild bronchitic changes. Cardiomediastinal silhouette is normal. Calcified aortic arch. Surgical clips LEFT chest wall. Osseous structures are nonacute. IMPRESSION: 1. New LEFT upper lobe mass, potentially pleural based. Recommend CT chest with contrast. 2. Mild bronchitic changes. 3.  Aortic Atherosclerosis (ICD10-I70.0). 4. Acute findings discussed with and reconfirmed by Dr.JULIE HAVILAND on 07/22/2017 at 9:58 pm. Electronically Signed   By: Elon Alas M.D.   On: 07/22/2017 21:59   Ct Chest W Contrast  Result Date: 07/22/2017 CLINICAL DATA:  77 year old female with rectal bleeding. EXAM: CT CHEST, ABDOMEN, AND PELVIS WITH CONTRAST TECHNIQUE: Multidetector CT imaging of the chest, abdomen and pelvis was performed following the standard protocol during bolus administration of intravenous contrast. CONTRAST:  1mL OMNIPAQUE IOHEXOL 300 MG/ML  SOLN COMPARISON:  None. FINDINGS: CT CHEST FINDINGS Cardiovascular: There is no cardiomegaly or pericardial effusion. There is coronary vascular calcification primarily involving the LAD. Mild atherosclerotic calcification of the aortic arch. The visualized origins of the great vessels of the aortic arch are patent. The central pulmonary arteries  are grossly unremarkable. Mediastinum/Nodes: There is no hilar or mediastinal adenopathy. The esophagus is grossly unremarkable. There is a 2.5 x 3.9 cm right thyroid cystic or necrotic nodule with multiple enhancing intracystic nodules. Further evaluation with thyroid ultrasound recommended. Additional smaller nodules noted in the left thyroid lobe. Lungs/Pleura: There is a 4.5 x 5.2 cm pleural based mass in the left upper lobe. There are multiple small bilateral nodular densities which may represent metastatic disease, or an atypical infection. There is no pleural effusion or pneumothorax. The central airways are patent. Musculoskeletal: There is no axillary adenopathy. Surgical clips of the left axilla and left-sided mastectomy noted. There is osteopenia with degenerative changes of the spine. There is diffuse heterogeneity of the spine which may be secondary to osteopenia or represent small metastatic disease. CT ABDOMEN PELVIS FINDINGS There is no intra-abdominal free air or free fluid. Hepatobiliary: Subcentimeter hypodense lesion in the anterior liver (series 2, image 46) is too small to characterize. No intrahepatic biliary ductal dilatation. Cholecystectomy.  Pancreas: Unremarkable. No pancreatic ductal dilatation or surrounding inflammatory changes. Spleen: Normal in size without focal abnormality. Adrenals/Urinary Tract: The adrenal glands are unremarkable. Multiple bilateral renal cysts as well as smaller hypodense lesions which are too small to characterize. There is no hydronephrosis on either side. There is symmetric enhancement and excretion of contrast by both kidneys. The visualized ureters and urinary bladder appear unremarkable. Stomach/Bowel: There is thickening of the wall of the duodenum and jejunal loops in the left upper abdomen with luminal dilatation highly suspicious for an infiltrative/neoplastic process such as lymphoma. There is thickening and luminal dilatation of a segment of bowel in the pelvis also concerning for lymphoma. Mural hemorrhage within the bowel wall is a less likely possibility if the patient is on anticoagulation. There is no bowel obstruction. There is colonic diverticulosis without active inflammatory changes. Appendectomy. Vascular/Lymphatic: Moderate aortoiliac atherosclerotic disease. No portal venous gas. There is no adenopathy. Reproductive: Hysterectomy. There is a 16 mm right ovarian cyst. The left ovary is not visualized with certainty Other: None Musculoskeletal: Osteopenia with degenerative changes of the spine no acute osseous pathology. IMPRESSION: 1. Left upper lobe pleural based mass. 2. Innumerable small pulmonary nodules may represent metastatic disease versus atypical infection. Clinical correlation is recommended. 3. Segment of bowel wall thickening and luminal dilatation involving the duodenum, jejunum, as well as distal small bowel suspicious for bowel lymphoma. PET-CT is recommended for further evaluation of pulmonary and bowel findings. 4. Colonic diverticulosis.  No bowel obstruction. 5. Osteopenia. Heterogeneity of the spine may be related to osteopenia although small metastatic lesions are not excluded. 6.  Left mastectomy. 7.  Aortic Atherosclerosis (ICD10-I70.0). Electronically Signed   By: Anner Crete M.D.   On: 07/22/2017 23:44   Ct Abdomen Pelvis W Contrast  Result Date: 07/22/2017 CLINICAL DATA:  77 year old female with rectal bleeding. EXAM: CT CHEST, ABDOMEN, AND PELVIS WITH CONTRAST TECHNIQUE: Multidetector CT imaging of the chest, abdomen and pelvis was performed following the standard protocol during bolus administration of intravenous contrast. CONTRAST:  78mL OMNIPAQUE IOHEXOL 300 MG/ML  SOLN COMPARISON:  None. FINDINGS: CT CHEST FINDINGS Cardiovascular: There is no cardiomegaly or pericardial effusion. There is coronary vascular calcification primarily involving the LAD. Mild atherosclerotic calcification of the aortic arch. The visualized origins of the great vessels of the aortic arch are patent. The central pulmonary arteries are grossly unremarkable. Mediastinum/Nodes: There is no hilar or mediastinal adenopathy. The esophagus is grossly unremarkable. There is a 2.5 x 3.9 cm right thyroid  cystic or necrotic nodule with multiple enhancing intracystic nodules. Further evaluation with thyroid ultrasound recommended. Additional smaller nodules noted in the left thyroid lobe. Lungs/Pleura: There is a 4.5 x 5.2 cm pleural based mass in the left upper lobe. There are multiple small bilateral nodular densities which may represent metastatic disease, or an atypical infection. There is no pleural effusion or pneumothorax. The central airways are patent. Musculoskeletal: There is no axillary adenopathy. Surgical clips of the left axilla and left-sided mastectomy noted. There is osteopenia with degenerative changes of the spine. There is diffuse heterogeneity of the spine which may be secondary to osteopenia or represent small metastatic disease. CT ABDOMEN PELVIS FINDINGS There is no intra-abdominal free air or free fluid. Hepatobiliary: Subcentimeter hypodense lesion in the anterior liver (series 2,  image 46) is too small to characterize. No intrahepatic biliary ductal dilatation. Cholecystectomy. Pancreas: Unremarkable. No pancreatic ductal dilatation or surrounding inflammatory changes. Spleen: Normal in size without focal abnormality. Adrenals/Urinary Tract: The adrenal glands are unremarkable. Multiple bilateral renal cysts as well as smaller hypodense lesions which are too small to characterize. There is no hydronephrosis on either side. There is symmetric enhancement and excretion of contrast by both kidneys. The visualized ureters and urinary bladder appear unremarkable. Stomach/Bowel: There is thickening of the wall of the duodenum and jejunal loops in the left upper abdomen with luminal dilatation highly suspicious for an infiltrative/neoplastic process such as lymphoma. There is thickening and luminal dilatation of a segment of bowel in the pelvis also concerning for lymphoma. Mural hemorrhage within the bowel wall is a less likely possibility if the patient is on anticoagulation. There is no bowel obstruction. There is colonic diverticulosis without active inflammatory changes. Appendectomy. Vascular/Lymphatic: Moderate aortoiliac atherosclerotic disease. No portal venous gas. There is no adenopathy. Reproductive: Hysterectomy. There is a 16 mm right ovarian cyst. The left ovary is not visualized with certainty Other: None Musculoskeletal: Osteopenia with degenerative changes of the spine no acute osseous pathology. IMPRESSION: 1. Left upper lobe pleural based mass. 2. Innumerable small pulmonary nodules may represent metastatic disease versus atypical infection. Clinical correlation is recommended. 3. Segment of bowel wall thickening and luminal dilatation involving the duodenum, jejunum, as well as distal small bowel suspicious for bowel lymphoma. PET-CT is recommended for further evaluation of pulmonary and bowel findings. 4. Colonic diverticulosis.  No bowel obstruction. 5. Osteopenia.  Heterogeneity of the spine may be related to osteopenia although small metastatic lesions are not excluded. 6. Left mastectomy. 7.  Aortic Atherosclerosis (ICD10-I70.0). Electronically Signed   By: Anner Crete M.D.   On: 07/22/2017 23:44   Ct Maxillofacial Wo Contrast  Result Date: 07/22/2017 CLINICAL DATA:  Pt c/o sinus pressure with pain and overall weakness since being diagnosed with sinus infection and uti at the end of June. pt complained of rt side swelling just below mandible EXAM: CT MAXILLOFACIAL WITHOUT CONTRAST TECHNIQUE: Multidetector CT imaging of the maxillofacial structures was performed. Multiplanar CT image reconstructions were also generated. COMPARISON:  None. 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 Multiple teeth are absent. There is a cavity of the remaining right maxillary molar. 1.0 by 0.6 by 0.8 cm lesion along the upper margin of the right frontal sinus has some endosteal scalloping and some faint spiculations of bone, but generally nonaggressive characteristics. There is some bony demineralization.  Cervical spondylosis noted. Orbits: Unremarkable Sinuses: Mild chronic right sphenoid sinusitis. Soft tissues: Posterior nasopharyngeal soft tissue fullness extending somewhat into the right nasal cavity. Polypoid lesion  or mass not excluded, and inspection is recommended. Left level II lymph node 1.3 cm in short axis on image 81/2. Right level IIb lymph node 1.0 cm in short axis. Suspected leftward deviation of the glottis, although there is extensive motion artifact making this difficult to be certain of. Limited intracranial: Unremarkable IMPRESSION: 1. Posterior nasopharyngeal soft tissue fullness extending somewhat into the posterior aspect of the right nasal cavity. Malignancy is not excluded and inspection of the posterior nasopharynx is recommended. 2. Bilateral mild level 2 adenopathy, also raising some concern for potential malignancy. 3. Small  expansile lesion with endosteal scalloping along the upper margin of the right frontal sinus. This has mostly nonaggressive characteristics, suggesting hemangioma, cholesterol granuloma, or similar lesion. 4. Bilateral torus mandibularis. 5. Cavity of the remaining right maxillary molar. 6. Mild chronic right sphenoid sinusitis. 7. Bony demineralization. Electronically Signed   By: Van Clines M.D.   On: 07/22/2017 21:53    EKG: Independently reviewed. Vent. rate 91 BPM PR interval * ms QRS duration 76 ms QT/QTc 368/453 ms P-R-T axes 64 38 58 Sinus rhythm Anteroseptal infarct, age indeterminate  Principal Problem:   UGI bleed Admit to telemetry/inpatient. Keep n.p.o. Continue PRBC transfusion. Hold antihypertensives. Continue Protonix infusion. Consult GI in AM  Active Problems:   Depression Only using as needed alprazolam for anxiety.    Hypothyroidism Resume levothyroxine once cleared for oral intake.    HTN (hypertension) Hold antihypertensives for now. Blood pressure monitoring.    UTI (urinary tract infection) Completed recent antibiotic course. No fever or dysuria now. No recent culture on results. Check UC&S.    Abnormal CT of the abdomen   Abnormal CT of the chest   Abnormal CT of paranasal sinuses History of breast cancer and left mastectomy. Consider IR evaluation once GI bleed stabilized and patient has been scoped. Consider oncology evaluation.    DVT prophylaxis: SCDs. Code Status: Full code. Family Communication:  Disposition Plan: Admit for PRBC transfusion and GI consult. Consults called: As Admission status: Inpatient/telemetry.   Reubin Milan MD [facility] Pager [phone].  If 7PM-7AM, please contact night-coverage [email or web address] Password [email or web address]  07/22/2017, 11:59 PM

## 2017-07-22 NOTE — ED Provider Notes (Signed)
[facility] EMERGENCY DEPARTMENT Provider Note   CSN: [id number] Arrival date & time: 07/22/17  1906     History   Chief Complaint Chief Complaint  Patient presents with  . Weakness    HPI Casey Lopez is a 77 y.o. female.  Pt presents to the ED today with weakness and sinus pain.  The pt has had sinus issues for the past few weeks.  She has tried several otc meds and neti pot without improvement of sx.  She was seen here on 6/28 and was diagnosed with an uti and put on keflex.  She took all of her meds.  No f/c.  She still c/o bloody mucous when she blows her nose.     Past Medical History:  Diagnosis Date  . Cancer ([place])    left breast  . Depression 12/24/2010  . Goiter   . HTN (hypertension) 12/24/2010  . Hypothyroidism 12/24/2010  . 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 Sickle cell trait [facility])     Patient Active Problem List   Diagnosis Date Noted  . UGI bleed 07/22/2017  . Hx of colonic polyps   . History of colon polyps 08/01/2014  . Elevated CEA 08/01/2014  . Infiltrating ductal carcinoma of breast, stage 1 ([name]) 12/24/2010  . Depression 12/24/2010  . Hypothyroidism 12/24/2010  . HTN (hypertension) 12/24/2010    Past Surgical History:  Procedure Laterality Date  . ABDOMINAL HYSTERECTOMY    . APPENDECTOMY    . BIOPSY THYROID    . CATARACT EXTRACTION     right eye with lens implant  . CATARACT EXTRACTION  2008   right  . CATARACT EXTRACTION W/PHACO  04/16/2011   Procedure: CATARACT EXTRACTION PHACO AND INTRAOCULAR LENS PLACEMENT (IOC);  Surgeon: Elta Guadeloupe T. Gershon Crane, MD;  Location: [facility] ORS;  Service: Ophthalmology;  Laterality: Left;  CDE=7.66  . CHOLECYSTECTOMY    . COLON RESECTION  1965   tumor 4X4 inch per patient. ?colon or other per patient  . COLONOSCOPY N/A 08/12/2014   Procedure: COLONOSCOPY;  Surgeon: Danie Binder, MD;  Location: [facility] ENDO SUITE;  Service: Endoscopy;  Laterality: N/A;  1300  . DENTAL SURGERY   11/2011  . HEMORRHOID SURGERY    . MASTECTOMY  2009   left  . TONSILECTOMY, ADENOIDECTOMY, BILATERAL MYRINGOTOMY AND TUBES       OB History   None      Home Medications    Prior to Admission medications   Medication Sig Start Date End Date Taking? Authorizing Provider  ALPRAZolam Duanne Moron) 0.5 MG tablet Take 0.5-1 tablets (0.25-0.5 mg total) by mouth 2 (two) times daily as needed for anxiety. 11/01/16  Yes Holley Bouche, NP  cetirizine (ZYRTEC) 10 MG tablet Take 10 mg by mouth daily as needed for allergies. Only as needed   Yes [provider]  Cholecalciferol (VITAMIN D PO) Take 1 capsule by mouth daily.   Yes [provider]  K-DUR 20 MEQ tablet TAKE (1) TABLET BY MOUTH ONCE DAILY. 08/14/11  Yes Kefalas, Manon Hilding, PA-C  levothyroxine (SYNTHROID, LEVOTHROID) 50 MCG tablet Take 50 mcg by mouth daily.     Yes [provider]  metFORMIN (GLUCOPHAGE-XR) 500 MG 24 hr tablet Take 500 mg by mouth every evening.  06/27/17  Yes [provider]  propranolol ER (INDERAL LA) 80 MG 24 hr capsule Take 1 capsule by mouth daily. 06/10/14  Yes [provider]  simvastatin (ZOCOR) 40 MG tablet Take  40 mg by mouth at bedtime.  12/16/11  Yes [provider]  cephALEXin (KEFLEX) 500 MG capsule Take 1 capsule (500 mg total) by mouth 4 (four) times daily. Patient not taking: Reported on 07/22/2017 07/05/17   Lily Kocher, PA-C  lisinopril-hydrochlorothiazide (PRINZIDE,ZESTORETIC) 20-12.5 MG per tablet Take 1 tablet by mouth daily. 06/10/14   [provider]    Family History Family History  Problem Relation Age of Onset  . Breast cancer Sister   . Cancer Mother   . Sickle cell anemia Father   . Anesthesia problems Neg Hx   . Hypotension Neg Hx   . Malignant hyperthermia Neg Hx   . Pseudochol deficiency Neg Hx     Social History Social History   Tobacco Use  . Smoking status: Never Smoker  . Smokeless tobacco: Never Used  Substance  Use Topics  . Alcohol use: No  . Drug use: No     Allergies   Sulfa antibiotics; Feldene [piroxicam]; and Penicillins   Review of Systems Review of Systems  HENT: Positive for congestion.   Respiratory: Positive for cough.   Neurological: Positive for weakness.  All other systems reviewed and are negative.    Physical Exam Updated Vital Signs BP (!) 106/49   Pulse 86   Temp 97.8 F (36.6 C) (Oral)   Resp 15   Ht 5\' 7"  (1.702 m)   Wt 69.9 kg (154 lb)   SpO2 97%   BMI 24.12 kg/m   Physical Exam  Constitutional: She is oriented to person, place, and time. She appears well-developed and well-nourished.  HENT:  Head: Normocephalic and atraumatic.  Right Ear: External ear normal.  Left Ear: External ear normal.  Nose: Nose normal.  Mouth/Throat: Oropharynx is clear and moist.  Maxillary sinus pain bilaterally  Eyes: Pupils are equal, round, and reactive to light. Conjunctivae and EOM are normal.  Neck: Normal range of motion. Neck supple.  Cardiovascular: Normal rate, regular rhythm, normal heart sounds and intact distal pulses.  Pulmonary/Chest: Effort normal and breath sounds normal.  Abdominal: Soft. Bowel sounds are normal.  Musculoskeletal: Normal range of motion.  Neurological: She is alert and oriented to person, place, and time.  Skin: Skin is warm. Capillary refill takes less than 2 seconds.  Psychiatric: She has a normal mood and affect. Her behavior is normal. Judgment and thought content normal.  Nursing note and vitals reviewed.    ED Treatments / Results  Labs (all labs ordered are listed, but only abnormal results are displayed) Labs Reviewed  COMPREHENSIVE METABOLIC PANEL - Abnormal; Notable for the following components:      Result Value   Creatinine, Ser 1.16 (*)    Calcium 8.4 (*)    Total Protein 5.8 (*)    Albumin 2.5 (*)    GFR calc non Af Amer 44 (*)    GFR calc Af Amer 51 (*)    All other components within normal limits  CBC WITH  DIFFERENTIAL/PLATELET - Abnormal; Notable for the following components:   WBC 13.3 (*)    RBC 2.64 (*)    Hemoglobin 7.7 (*)    HCT 23.9 (*)    Neutro Abs 8.3 (*)    Monocytes Absolute 1.1 (*)    All other components within normal limits  URINALYSIS, ROUTINE W REFLEX MICROSCOPIC - Abnormal; Notable for the following components:   APPearance HAZY (*)    Leukocytes, UA LARGE (*)    Bacteria, UA RARE (*)    All  other components within normal limits  POC OCCULT BLOOD, ED - Abnormal; Notable for the following components:   Fecal Occult Bld POSITIVE (*)    All other components within normal limits  TYPE AND SCREEN  PREPARE RBC (CROSSMATCH)     EKG None  Radiology Dg Chest 2 View  Result Date: 07/22/2017 CLINICAL DATA:  Weakness. Sinus infection. History of breast cancer. EXAM: CHEST - 2 VIEW COMPARISON:  Chest radiograph April 08, 2007 FINDINGS: Homogeneously dense 4.5 cm LEFT upper lobe mass, possible pleural based. No pleural effusion. Mild bronchitic changes. Cardiomediastinal silhouette is normal. Calcified aortic arch. Surgical clips LEFT chest wall. Osseous structures are nonacute. IMPRESSION: 1. New LEFT upper lobe mass, potentially pleural based. Recommend CT chest with contrast. 2. Mild bronchitic changes. 3.  Aortic Atherosclerosis (ICD10-I70.0). 4. Acute findings discussed with and reconfirmed by Dr.Truth Wolaver on 07/22/2017 at 9:58 pm. Electronically Signed   By: Elon Alas M.D.   On: 07/22/2017 21:59   Ct Chest W Contrast  Result Date: 07/22/2017 CLINICAL DATA:  77 year old female with rectal bleeding. EXAM: CT CHEST, ABDOMEN, AND PELVIS WITH CONTRAST TECHNIQUE: Multidetector CT imaging of the chest, abdomen and pelvis was performed following the standard protocol during bolus administration of intravenous contrast. CONTRAST:  7mL OMNIPAQUE IOHEXOL 300 MG/ML  SOLN COMPARISON:  None. FINDINGS: CT CHEST FINDINGS Cardiovascular: There is no cardiomegaly or pericardial  effusion. There is coronary vascular calcification primarily involving the LAD. Mild atherosclerotic calcification of the aortic arch. The visualized origins of the great vessels of the aortic arch are patent. The central pulmonary arteries are grossly unremarkable. Mediastinum/Nodes: There is no hilar or mediastinal adenopathy. The esophagus is grossly unremarkable. There is a 2.5 x 3.9 cm right thyroid cystic or necrotic nodule with multiple enhancing intracystic nodules. Further evaluation with thyroid ultrasound recommended. Additional smaller nodules noted in the left thyroid lobe. Lungs/Pleura: There is a 4.5 x 5.2 cm pleural based mass in the left upper lobe. There are multiple small bilateral nodular densities which may represent metastatic disease, or an atypical infection. There is no pleural effusion or pneumothorax. The central airways are patent. Musculoskeletal: There is no axillary adenopathy. Surgical clips of the left axilla and left-sided mastectomy noted. There is osteopenia with degenerative changes of the spine. There is diffuse heterogeneity of the spine which may be secondary to osteopenia or represent small metastatic disease. CT ABDOMEN PELVIS FINDINGS There is no intra-abdominal free air or free fluid. Hepatobiliary: Subcentimeter hypodense lesion in the anterior liver (series 2, image 46) is too small to characterize. No intrahepatic biliary ductal dilatation. Cholecystectomy. Pancreas: Unremarkable. No pancreatic ductal dilatation or surrounding inflammatory changes. Spleen: Normal in size without focal abnormality. Adrenals/Urinary Tract: The adrenal glands are unremarkable. Multiple bilateral renal cysts as well as smaller hypodense lesions which are too small to characterize. There is no hydronephrosis on either side. There is symmetric enhancement and excretion of contrast by both kidneys. The visualized ureters and urinary bladder appear unremarkable. Stomach/Bowel: There is  thickening of the wall of the duodenum and jejunal loops in the left upper abdomen with luminal dilatation highly suspicious for an infiltrative/neoplastic process such as lymphoma. There is thickening and luminal dilatation of a segment of bowel in the pelvis also concerning for lymphoma. Mural hemorrhage within the bowel wall is a less likely possibility if the patient is on anticoagulation. There is no bowel obstruction. There is colonic diverticulosis without active inflammatory changes. Appendectomy. Vascular/Lymphatic: Moderate aortoiliac atherosclerotic disease. No portal venous gas. There  is no adenopathy. Reproductive: Hysterectomy. There is a 16 mm right ovarian cyst. The left ovary is not visualized with certainty Other: None Musculoskeletal: Osteopenia with degenerative changes of the spine no acute osseous pathology. IMPRESSION: 1. Left upper lobe pleural based mass. 2. Innumerable small pulmonary nodules may represent metastatic disease versus atypical infection. Clinical correlation is recommended. 3. Segment of bowel wall thickening and luminal dilatation involving the duodenum, jejunum, as well as distal small bowel suspicious for bowel lymphoma. PET-CT is recommended for further evaluation of pulmonary and bowel findings. 4. Colonic diverticulosis.  No bowel obstruction. 5. Osteopenia. Heterogeneity of the spine may be related to osteopenia although small metastatic lesions are not excluded. 6. Left mastectomy. 7.  Aortic Atherosclerosis (ICD10-I70.0). Electronically Signed   By: Anner Crete M.D.   On: 07/22/2017 23:44   Ct Abdomen Pelvis W Contrast  Result Date: 07/22/2017 CLINICAL DATA:  77 year old female with rectal bleeding. EXAM: CT CHEST, ABDOMEN, AND PELVIS WITH CONTRAST TECHNIQUE: Multidetector CT imaging of the chest, abdomen and pelvis was performed following the standard protocol during bolus administration of intravenous contrast. CONTRAST:  47mL OMNIPAQUE IOHEXOL 300 MG/ML   SOLN COMPARISON:  None. FINDINGS: CT CHEST FINDINGS Cardiovascular: There is no cardiomegaly or pericardial effusion. There is coronary vascular calcification primarily involving the LAD. Mild atherosclerotic calcification of the aortic arch. The visualized origins of the great vessels of the aortic arch are patent. The central pulmonary arteries are grossly unremarkable. Mediastinum/Nodes: There is no hilar or mediastinal adenopathy. The esophagus is grossly unremarkable. There is a 2.5 x 3.9 cm right thyroid cystic or necrotic nodule with multiple enhancing intracystic nodules. Further evaluation with thyroid ultrasound recommended. Additional smaller nodules noted in the left thyroid lobe. Lungs/Pleura: There is a 4.5 x 5.2 cm pleural based mass in the left upper lobe. There are multiple small bilateral nodular densities which may represent metastatic disease, or an atypical infection. There is no pleural effusion or pneumothorax. The central airways are patent. Musculoskeletal: There is no axillary adenopathy. Surgical clips of the left axilla and left-sided mastectomy noted. There is osteopenia with degenerative changes of the spine. There is diffuse heterogeneity of the spine which may be secondary to osteopenia or represent small metastatic disease. CT ABDOMEN PELVIS FINDINGS There is no intra-abdominal free air or free fluid. Hepatobiliary: Subcentimeter hypodense lesion in the anterior liver (series 2, image 46) is too small to characterize. No intrahepatic biliary ductal dilatation. Cholecystectomy. Pancreas: Unremarkable. No pancreatic ductal dilatation or surrounding inflammatory changes. Spleen: Normal in size without focal abnormality. Adrenals/Urinary Tract: The adrenal glands are unremarkable. Multiple bilateral renal cysts as well as smaller hypodense lesions which are too small to characterize. There is no hydronephrosis on either side. There is symmetric enhancement and excretion of contrast by  both kidneys. The visualized ureters and urinary bladder appear unremarkable. Stomach/Bowel: There is thickening of the wall of the duodenum and jejunal loops in the left upper abdomen with luminal dilatation highly suspicious for an infiltrative/neoplastic process such as lymphoma. There is thickening and luminal dilatation of a segment of bowel in the pelvis also concerning for lymphoma. Mural hemorrhage within the bowel wall is a less likely possibility if the patient is on anticoagulation. There is no bowel obstruction. There is colonic diverticulosis without active inflammatory changes. Appendectomy. Vascular/Lymphatic: Moderate aortoiliac atherosclerotic disease. No portal venous gas. There is no adenopathy. Reproductive: Hysterectomy. There is a 16 mm right ovarian cyst. The left ovary is not visualized with certainty Other: None Musculoskeletal: Osteopenia  with degenerative changes of the spine no acute osseous pathology. IMPRESSION: 1. Left upper lobe pleural based mass. 2. Innumerable small pulmonary nodules may represent metastatic disease versus atypical infection. Clinical correlation is recommended. 3. Segment of bowel wall thickening and luminal dilatation involving the duodenum, jejunum, as well as distal small bowel suspicious for bowel lymphoma. PET-CT is recommended for further evaluation of pulmonary and bowel findings. 4. Colonic diverticulosis.  No bowel obstruction. 5. Osteopenia. Heterogeneity of the spine may be related to osteopenia although small metastatic lesions are not excluded. 6. Left mastectomy. 7.  Aortic Atherosclerosis (ICD10-I70.0). Electronically Signed   By: Anner Crete M.D.   On: 07/22/2017 23:44   Ct Maxillofacial Wo Contrast  Result Date: 07/22/2017 CLINICAL DATA:  Pt c/o sinus pressure with pain and overall weakness since being diagnosed with sinus infection and uti at the end of June. pt complained of rt side swelling just below mandible EXAM: CT MAXILLOFACIAL  WITHOUT CONTRAST TECHNIQUE: Multidetector CT imaging of the maxillofacial structures was performed. Multiplanar CT image reconstructions were also generated. COMPARISON:  None. 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 Multiple teeth are absent. There is a cavity of the remaining right maxillary molar. 1.0 by 0.6 by 0.8 cm lesion along the upper margin of the right frontal sinus has some endosteal scalloping and some faint spiculations of bone, but generally nonaggressive characteristics. There is some bony demineralization.  Cervical spondylosis noted. Orbits: Unremarkable Sinuses: Mild chronic right sphenoid sinusitis. Soft tissues: Posterior nasopharyngeal soft tissue fullness extending somewhat into the right nasal cavity. Polypoid lesion or mass not excluded, and inspection is recommended. Left level II lymph node 1.3 cm in short axis on image 81/2. Right level IIb lymph node 1.0 cm in short axis. Suspected leftward deviation of the glottis, although there is extensive motion artifact making this difficult to be certain of. Limited intracranial: Unremarkable IMPRESSION: 1. Posterior nasopharyngeal soft tissue fullness extending somewhat into the posterior aspect of the right nasal cavity. Malignancy is not excluded and inspection of the posterior nasopharynx is recommended. 2. Bilateral mild level 2 adenopathy, also raising some concern for potential malignancy. 3. Small expansile lesion with endosteal scalloping along the upper margin of the right frontal sinus. This has mostly nonaggressive characteristics, suggesting hemangioma, cholesterol granuloma, or similar lesion. 4. Bilateral torus mandibularis. 5. Cavity of the remaining right maxillary molar. 6. Mild chronic right sphenoid sinusitis. 7. Bony demineralization. Electronically Signed   By: Van Clines M.D.   On: 07/22/2017 21:53    Procedures Procedures (including critical care time)  Medications Ordered in  ED Medications  0.9 %  sodium chloride infusion (has no administration in time range)  pantoprazole (PROTONIX) 80 mg in sodium chloride 0.9 % 100 mL IVPB (has no administration in time range)  pantoprazole (PROTONIX) 80 mg in sodium chloride 0.9 % 250 mL (0.32 mg/mL) infusion (has no administration in time range)  pantoprazole (PROTONIX) injection 40 mg (has no administration in time range)  sodium chloride 0.9 % bolus 1,000 mL (1,000 mLs Intravenous New Bag/Given 07/22/17 2201)  ondansetron (ZOFRAN) injection 4 mg (4 mg Intravenous Given 07/22/17 2201)  morphine 4 MG/ML injection 4 mg (4 mg Intravenous Given 07/22/17 2201)  iohexol (OMNIPAQUE) 300 MG/ML solution 75 mL (75 mLs Intravenous Contrast Given 07/22/17 2252)     Initial Impression / Assessment and Plan / ED Course  I have reviewed the triage vital signs and the nursing notes.  Pertinent labs & imaging results that were  available during my care of the patient were reviewed by me and considered in my medical decision making (see chart for details).    CRITICAL CARE Performed by: Isla Pence   Total critical care time: 30 minutes  Critical care time was exclusive of separately billable procedures and treating other patients.  Critical care was necessary to treat or prevent imminent or life-threatening deterioration.  Critical care was time spent personally by me on the following activities: development of treatment plan with patient and/or surrogate as well as nursing, discussions with consultants, evaluation of patient's response to treatment, examination of patient, obtaining history from patient or surrogate, ordering and performing treatments and interventions, ordering and review of laboratory studies, ordering and review of radiographic studies, pulse oximetry and re-evaluation of patient's condition.  On 6/28, hgb 10.9.  It is down to 7.7 today.  Pt said she's had black stool and it is guaiac +.  2 units prbcs ordered for  symptomatic anemia.  Unfortunately, pt's CT scans do not look good.  It looks like she has metastatic cancer.  Primary source is unclear.  She has a hx of breast and colon cancer.    Pt d/w Dr. Olevia Bowens (triad) for admission.  Final Clinical Impressions(s) / ED Diagnoses   Final diagnoses:  Symptomatic anemia  Nasopharyngeal mass  UGI bleed  Metastatic cancer [facility])    ED Discharge Orders    None       Isla Pence, MD 07/23/17 0005

## 2017-07-22 NOTE — ED Triage Notes (Signed)
Pt c/o sinus pressure with pain and overall weakness since being diagnosed with sinus infection and uti at the end of June.

## 2017-07-23 ENCOUNTER — Other Ambulatory Visit: Payer: Self-pay | Admitting: Student

## 2017-07-23 DIAGNOSIS — R930 Abnormal findings on diagnostic imaging of skull and head, not elsewhere classified: Secondary | ICD-10-CM | POA: Diagnosis present

## 2017-07-23 DIAGNOSIS — C799 Secondary malignant neoplasm of unspecified site: Secondary | ICD-10-CM

## 2017-07-23 DIAGNOSIS — D649 Anemia, unspecified: Secondary | ICD-10-CM

## 2017-07-23 DIAGNOSIS — R918 Other nonspecific abnormal finding of lung field: Secondary | ICD-10-CM

## 2017-07-23 DIAGNOSIS — R9389 Abnormal findings on diagnostic imaging of other specified body structures: Secondary | ICD-10-CM | POA: Diagnosis present

## 2017-07-23 DIAGNOSIS — K922 Gastrointestinal hemorrhage, unspecified: Secondary | ICD-10-CM

## 2017-07-23 DIAGNOSIS — N390 Urinary tract infection, site not specified: Secondary | ICD-10-CM | POA: Diagnosis present

## 2017-07-23 DIAGNOSIS — R935 Abnormal findings on diagnostic imaging of other abdominal regions, including retroperitoneum: Secondary | ICD-10-CM | POA: Diagnosis present

## 2017-07-23 LAB — CBC WITH DIFFERENTIAL/PLATELET
BASOS ABS: 0 10*3/uL (ref 0.0–0.1)
BASOS PCT: 0 %
EOS ABS: 0.1 10*3/uL (ref 0.0–0.7)
Eosinophils Relative: 1 %
HCT: 32.2 % — ABNORMAL LOW (ref 36.0–46.0)
HEMOGLOBIN: 10.6 g/dL — AB (ref 12.0–15.0)
Lymphocytes Relative: 17 %
Lymphs Abs: 2.5 10*3/uL (ref 0.7–4.0)
MCH: 30.2 pg (ref 26.0–34.0)
MCHC: 32.9 g/dL (ref 30.0–36.0)
MCV: 91.7 fL (ref 78.0–100.0)
MONOS PCT: 7 %
Monocytes Absolute: 1 10*3/uL (ref 0.1–1.0)
NEUTROS PCT: 75 %
Neutro Abs: 10.9 10*3/uL — ABNORMAL HIGH (ref 1.7–7.7)
Platelets: 314 10*3/uL (ref 150–400)
RBC: 3.51 MIL/uL — ABNORMAL LOW (ref 3.87–5.11)
RDW: 14.4 % (ref 11.5–15.5)
WBC: 14.5 10*3/uL — AB (ref 4.0–10.5)

## 2017-07-23 LAB — T4, FREE: FREE T4: 1.14 ng/dL (ref 0.82–1.77)

## 2017-07-23 LAB — URINALYSIS, COMPLETE (UACMP) WITH MICROSCOPIC
BACTERIA UA: NONE SEEN
Bilirubin Urine: NEGATIVE
Glucose, UA: NEGATIVE mg/dL
HGB URINE DIPSTICK: NEGATIVE
Ketones, ur: 5 mg/dL — AB
Nitrite: NEGATIVE
PROTEIN: NEGATIVE mg/dL
Specific Gravity, Urine: 1.018 (ref 1.005–1.030)
pH: 5 (ref 5.0–8.0)

## 2017-07-23 LAB — BASIC METABOLIC PANEL
ANION GAP: 7 (ref 5–15)
BUN: 15 mg/dL (ref 8–23)
CALCIUM: 8.2 mg/dL — AB (ref 8.9–10.3)
CHLORIDE: 106 mmol/L (ref 98–111)
CO2: 27 mmol/L (ref 22–32)
CREATININE: 1 mg/dL (ref 0.44–1.00)
GFR calc non Af Amer: 53 mL/min — ABNORMAL LOW (ref >60)
Glucose, Bld: 108 mg/dL — ABNORMAL HIGH (ref 70–99)
Potassium: 4.3 mmol/L (ref 3.5–5.1)
SODIUM: 140 mmol/L (ref 135–145)

## 2017-07-23 LAB — FOLATE: Folate: 13.1 ng/mL (ref >5.9)

## 2017-07-23 LAB — TSH: TSH: 0.723 u[IU]/mL (ref 0.350–4.500)

## 2017-07-23 LAB — IRON AND TIBC
IRON: 45 ug/dL (ref 28–170)
Saturation Ratios: 22 % (ref 10.4–31.8)
TIBC: 209 ug/dL — AB (ref 250–450)
UIBC: 164 ug/dL

## 2017-07-23 LAB — HEMOGLOBIN AND HEMATOCRIT, BLOOD
HEMATOCRIT: 31 % — AB (ref 36.0–46.0)
HEMOGLOBIN: 10.2 g/dL — AB (ref 12.0–15.0)

## 2017-07-23 LAB — VITAMIN B12: VITAMIN B 12: 530 pg/mL (ref 180–914)

## 2017-07-23 MED ORDER — FENTANYL CITRATE (PF) 100 MCG/2ML IJ SOLN
25.0000 ug | Freq: Once | INTRAMUSCULAR | Status: AC
  Administered 2017-07-23: 25 ug via INTRAVENOUS
  Filled 2017-07-23: qty 2

## 2017-07-23 MED ORDER — HYDROCODONE-ACETAMINOPHEN 5-325 MG PO TABS
1.0000 | ORAL_TABLET | Freq: Four times a day (QID) | ORAL | Status: DC | PRN
  Administered 2017-07-23 – 2017-07-24 (×3): 1 via ORAL
  Filled 2017-07-23 (×4): qty 1

## 2017-07-23 MED ORDER — ONDANSETRON HCL 4 MG PO TABS: 4.0000 mg | ORAL_TABLET | Freq: Four times a day (QID) | ORAL | Status: DC | PRN

## 2017-07-23 MED ORDER — ONDANSETRON HCL 4 MG/2ML IJ SOLN: 4.0000 mg | Freq: Four times a day (QID) | INTRAMUSCULAR | Status: DC | PRN

## 2017-07-23 MED ORDER — MENTHOL 3 MG MT LOZG
1.0000 | LOZENGE | OROMUCOSAL | Status: DC | PRN
  Administered 2017-07-23: 3 mg via ORAL
  Filled 2017-07-23: qty 9

## 2017-07-23 MED ORDER — PANTOPRAZOLE SODIUM 40 MG IV SOLR
INTRAVENOUS | Status: AC
  Filled 2017-07-23: qty 160

## 2017-07-23 MED ORDER — ACETAMINOPHEN 325 MG PO TABS: 650.0000 mg | ORAL_TABLET | Freq: Four times a day (QID) | ORAL | Status: DC | PRN

## 2017-07-23 MED ORDER — ACETAMINOPHEN 650 MG RE SUPP: 650.0000 mg | Freq: Four times a day (QID) | RECTAL | Status: DC | PRN

## 2017-07-23 NOTE — Progress Notes (Signed)
Protonix drip administration time moved to 0800 d/t blood transfusion administration and limited IV access. Will make oncoming RN aware. Patient did receive protonix bolus.

## 2017-07-23 NOTE — ED Notes (Signed)
3 Nurses have tried to get a second IV access with no success

## 2017-07-23 NOTE — Progress Notes (Signed)
PROGRESS NOTE  Casey Lopez KKX:[id number] DOB: 1940-01-28 DOA: 07/22/2017 PCP: [name], MD  Brief History:  77 year old female with history of left-sided breast cancer in remission, hypertension, depression, sickle cell trait presenting with 1 month history of generalized weakness and melanotic stools.  The patient has also had intermittent epistaxis and sinus drainage and pressure.  Reportedly, the patient states that she had a referral outpatient to see ear nose and throat, but this has not yet occurred.  Upon presentation, CT of the chest revealed a 4.5 x 5.2 pleural-based left upper lobe mass with innumerable bilateral pulmonary nodules.  There was also segmental bowel wall thickening and luminal dilatation involving the  duodenum and jejunum.  The patient was noted to have hemoglobin of 7.7 at the time of admission.  Her baseline hemoglobin is approximately 11.  The patient states that she takes 1 Aleve daily for the past month.  GI was consulted to assist with management.  Interventional radiology was consulted for possible biopsy of her lung mass.  Assessment/Plan: Acute blood loss anemia -Secondary to upper GI bleed -Transfuse 2 units PRBC -Monitor CBC  Melanotic stool -GI consult -Suspect this may be NSAID induced -Continue PPI -clear liquid diet for now  Left Lung Mass/Lung Nodules -consulted IR for biopsy -pt with 40 pk year hx of tobacco and hx of breast cancer -noted on CT chest  SB wall thickening -GI consult -08/12/2014 colonoscopy diverticulosis, polyps removed - 07/22/2017 CT abdomen segmental bowel wall thickening and luminal dilatation involving the duodenum, jejunum, and distal small bowel  Thyroid nodule/cyst -Incidental finding noted on CT -Check thyroid functions -Outpatient endocrine follow-up  Generalized weakness -Multifactorial including possible UTI, metastatic cancer, blood loss anemia, and deconditioning -Check iron studies -Check  serum B12 -UA and urine culture -Check folic acid -PT evaluation  Essential hypertension -Holding lisinopril/HCTZ secondary to soft blood pressure initially -Holding propranolol secondary to soft blood pressure initially -Monitor clinically  Sinus congestion/pain -CT maxillofacial--posterior nasopharyngeal soft tissue fullness extending into the posterior aspect of the right nasal cavity -Expansile lesion with endosteal scalloping in the right frontal sinus with nonaggressive features -Patient will need outpatient ENT follow-up   Disposition Plan:   Home in 2-3 days  Family Communication:   Son updated at bedside 7/17  Consultants: IR, GI   Code Status:  FULL /  DVT Prophylaxis:  SCD   Procedures: As Listed in Progress Note Above  Antibiotics: None    Subjective: Patient complains of sinus congestion.  She denies any dysphagia, odynophagia, chest pain, shortness breath, nausea, vomiting, diarrhea, hematochezia.  She has some melena.  She denies any headache or neck pain.  There is no visual disturbance.  Objective: Vitals:   07/23/17 0453 07/23/17 0454 07/23/17 0526 07/23/17 0844  BP: 116/60 116/60 (!) 121/59 (!) 120/56  Pulse: 87 87 86 88  Resp: 16 16 16 16   Temp: 98.5 F (36.9 C) 98.5 F (36.9 C) 98.2 F (36.8 C) 97.6 F (36.4 C)  TempSrc: Oral Oral Oral Oral  SpO2: 96% 96% 95% 100%  Weight:      Height:        Intake/Output Summary (Last 24 hours) at 07/23/2017 1232 Last data filed at 07/23/2017 0841 Gross per 24 hour  Intake 1647 ml  Output -  Net 1647 ml   Weight change:  Exam:   General:  Pt is alert, follows commands appropriately, not in acute distress  HEENT: No  icterus, No thrush, bilateral anterior cervical lymphadenopathy [place]/AT  Cardiovascular: RRR, S1/S2, no rubs, no gallops  Respiratory: Bibasilar rales.  No wheezing.  Good air movement.  Abdomen: Soft/+BS, non tender, non distended, no guarding  Extremities: No edema, No  lymphangitis, No petechiae, No rashes, no synovitis   Data Reviewed: I have personally reviewed following labs and imaging studies Basic Metabolic Panel: Recent Labs  Lab 07/22/17 2153 07/23/17 1004  NA 139 140  K 4.3 4.3  CL 103 106  CO2 28 27  GLUCOSE 92 108*  BUN 22 15  CREATININE 1.16* 1.00  CALCIUM 8.4* 8.2*   Liver Function Tests: Recent Labs  Lab 07/22/17 2153  AST 17  ALT 13  ALKPHOS 58  BILITOT 0.4  PROT 5.8*  ALBUMIN 2.5*   No results for input(s): LIPASE, AMYLASE in the last 168 hours. No results for input(s): AMMONIA in the last 168 hours. Coagulation Profile: No results for input(s): INR, PROTIME in the last 168 hours. CBC: Recent Labs  Lab 07/22/17 2153 07/23/17 1004  WBC 13.3* 14.5*  NEUTROABS 8.3* 10.9*  HGB 7.7* 10.6*  HCT 23.9* 32.2*  MCV 90.5 91.7  PLT 386 314   Cardiac Enzymes: No results for input(s): CKTOTAL, CKMB, CKMBINDEX, TROPONINI in the last 168 hours. BNP: Invalid input(s): POCBNP CBG: No results for input(s): GLUCAP in the last 168 hours. HbA1C: No results for input(s): HGBA1C in the last 72 hours. Urine analysis:    Component Value Date/Time   COLORURINE YELLOW 07/22/2017 2050   APPEARANCEUR HAZY (A) 07/22/2017 2050   LABSPEC 1.016 07/22/2017 2050   PHURINE 5.0 07/22/2017 2050   GLUCOSEU NEGATIVE 07/22/2017 2050   [place] NEGATIVE 07/22/2017 2050   [place] NEGATIVE 07/22/2017 2050   [place] 07/22/2017 2050   PROTEINUR NEGATIVE 07/22/2017 2050   NITRITE NEGATIVE 07/22/2017 2050   LEUKOCYTESUR LARGE (A) 07/22/2017 2050   Sepsis Labs: @LABRCNTIP (procalcitonin:4,lacticidven:4) )No results found for this or any previous visit (from the past 240 hour(s)).   Scheduled Meds: . [START ON 07/26/2017] pantoprazole  40 mg Intravenous Q12H   Continuous Infusions: . pantoprozole (PROTONIX) infusion 8 mg/hr (07/23/17 0849)    Procedures/Studies: Dg Chest 2 View  Result Date: 07/22/2017 CLINICAL DATA:   Weakness. Sinus infection. History of breast cancer. EXAM: CHEST - 2 VIEW COMPARISON:  Chest radiograph April 08, 2007 FINDINGS: Homogeneously dense 4.5 cm LEFT upper lobe mass, possible pleural based. No pleural effusion. Mild bronchitic changes. Cardiomediastinal silhouette is normal. Calcified aortic arch. Surgical clips LEFT chest wall. Osseous structures are nonacute. IMPRESSION: 1. New LEFT upper lobe mass, potentially pleural based. Recommend CT chest with contrast. 2. Mild bronchitic changes. 3.  Aortic Atherosclerosis (ICD10-I70.0). 4. Acute findings discussed with and reconfirmed by Dr.JULIE HAVILAND on 07/22/2017 at 9:58 pm. Electronically Signed   By: Elon Alas M.D.   On: 07/22/2017 21:59   Ct Chest W Contrast  Result Date: 07/22/2017 CLINICAL DATA:  77 year old female with rectal bleeding. EXAM: CT CHEST, ABDOMEN, AND PELVIS WITH CONTRAST TECHNIQUE: Multidetector CT imaging of the chest, abdomen and pelvis was performed following the standard protocol during bolus administration of intravenous contrast. CONTRAST:  19mL OMNIPAQUE IOHEXOL 300 MG/ML  SOLN COMPARISON:  None. FINDINGS: CT CHEST FINDINGS Cardiovascular: There is no cardiomegaly or pericardial effusion. There is coronary vascular calcification primarily involving the LAD. Mild atherosclerotic calcification of the aortic arch. The visualized origins of the great vessels of the aortic arch are patent. The central pulmonary arteries are grossly unremarkable. Mediastinum/Nodes: There is  no hilar or mediastinal adenopathy. The esophagus is grossly unremarkable. There is a 2.5 x 3.9 cm right thyroid cystic or necrotic nodule with multiple enhancing intracystic nodules. Further evaluation with thyroid ultrasound recommended. Additional smaller nodules noted in the left thyroid lobe. Lungs/Pleura: There is a 4.5 x 5.2 cm pleural based mass in the left upper lobe. There are multiple small bilateral nodular densities which may represent  metastatic disease, or an atypical infection. There is no pleural effusion or pneumothorax. The central airways are patent. Musculoskeletal: There is no axillary adenopathy. Surgical clips of the left axilla and left-sided mastectomy noted. There is osteopenia with degenerative changes of the spine. There is diffuse heterogeneity of the spine which may be secondary to osteopenia or represent small metastatic disease. CT ABDOMEN PELVIS FINDINGS There is no intra-abdominal free air or free fluid. Hepatobiliary: Subcentimeter hypodense lesion in the anterior liver (series 2, image 46) is too small to characterize. No intrahepatic biliary ductal dilatation. Cholecystectomy. Pancreas: Unremarkable. No pancreatic ductal dilatation or surrounding inflammatory changes. Spleen: Normal in size without focal abnormality. Adrenals/Urinary Tract: The adrenal glands are unremarkable. Multiple bilateral renal cysts as well as smaller hypodense lesions which are too small to characterize. There is no hydronephrosis on either side. There is symmetric enhancement and excretion of contrast by both kidneys. The visualized ureters and urinary bladder appear unremarkable. Stomach/Bowel: There is thickening of the wall of the duodenum and jejunal loops in the left upper abdomen with luminal dilatation highly suspicious for an infiltrative/neoplastic process such as lymphoma. There is thickening and luminal dilatation of a segment of bowel in the pelvis also concerning for lymphoma. Mural hemorrhage within the bowel wall is a less likely possibility if the patient is on anticoagulation. There is no bowel obstruction. There is colonic diverticulosis without active inflammatory changes. Appendectomy. Vascular/Lymphatic: Moderate aortoiliac atherosclerotic disease. No portal venous gas. There is no adenopathy. Reproductive: Hysterectomy. There is a 16 mm right ovarian cyst. The left ovary is not visualized with certainty Other: None  Musculoskeletal: Osteopenia with degenerative changes of the spine no acute osseous pathology. IMPRESSION: 1. Left upper lobe pleural based mass. 2. Innumerable small pulmonary nodules may represent metastatic disease versus atypical infection. Clinical correlation is recommended. 3. Segment of bowel wall thickening and luminal dilatation involving the duodenum, jejunum, as well as distal small bowel suspicious for bowel lymphoma. PET-CT is recommended for further evaluation of pulmonary and bowel findings. 4. Colonic diverticulosis.  No bowel obstruction. 5. Osteopenia. Heterogeneity of the spine may be related to osteopenia although small metastatic lesions are not excluded. 6. Left mastectomy. 7.  Aortic Atherosclerosis (ICD10-I70.0). Electronically Signed   By: Anner Crete M.D.   On: 07/22/2017 23:44   Ct Abdomen Pelvis W Contrast  Result Date: 07/22/2017 CLINICAL DATA:  77 year old female with rectal bleeding. EXAM: CT CHEST, ABDOMEN, AND PELVIS WITH CONTRAST TECHNIQUE: Multidetector CT imaging of the chest, abdomen and pelvis was performed following the standard protocol during bolus administration of intravenous contrast. CONTRAST:  57mL OMNIPAQUE IOHEXOL 300 MG/ML  SOLN COMPARISON:  None. FINDINGS: CT CHEST FINDINGS Cardiovascular: There is no cardiomegaly or pericardial effusion. There is coronary vascular calcification primarily involving the LAD. Mild atherosclerotic calcification of the aortic arch. The visualized origins of the great vessels of the aortic arch are patent. The central pulmonary arteries are grossly unremarkable. Mediastinum/Nodes: There is no hilar or mediastinal adenopathy. The esophagus is grossly unremarkable. There is a 2.5 x 3.9 cm right thyroid cystic or necrotic nodule with multiple  enhancing intracystic nodules. Further evaluation with thyroid ultrasound recommended. Additional smaller nodules noted in the left thyroid lobe. Lungs/Pleura: There is a 4.5 x 5.2 cm pleural  based mass in the left upper lobe. There are multiple small bilateral nodular densities which may represent metastatic disease, or an atypical infection. There is no pleural effusion or pneumothorax. The central airways are patent. Musculoskeletal: There is no axillary adenopathy. Surgical clips of the left axilla and left-sided mastectomy noted. There is osteopenia with degenerative changes of the spine. There is diffuse heterogeneity of the spine which may be secondary to osteopenia or represent small metastatic disease. CT ABDOMEN PELVIS FINDINGS There is no intra-abdominal free air or free fluid. Hepatobiliary: Subcentimeter hypodense lesion in the anterior liver (series 2, image 46) is too small to characterize. No intrahepatic biliary ductal dilatation. Cholecystectomy. Pancreas: Unremarkable. No pancreatic ductal dilatation or surrounding inflammatory changes. Spleen: Normal in size without focal abnormality. Adrenals/Urinary Tract: The adrenal glands are unremarkable. Multiple bilateral renal cysts as well as smaller hypodense lesions which are too small to characterize. There is no hydronephrosis on either side. There is symmetric enhancement and excretion of contrast by both kidneys. The visualized ureters and urinary bladder appear unremarkable. Stomach/Bowel: There is thickening of the wall of the duodenum and jejunal loops in the left upper abdomen with luminal dilatation highly suspicious for an infiltrative/neoplastic process such as lymphoma. There is thickening and luminal dilatation of a segment of bowel in the pelvis also concerning for lymphoma. Mural hemorrhage within the bowel wall is a less likely possibility if the patient is on anticoagulation. There is no bowel obstruction. There is colonic diverticulosis without active inflammatory changes. Appendectomy. Vascular/Lymphatic: Moderate aortoiliac atherosclerotic disease. No portal venous gas. There is no adenopathy. Reproductive:  Hysterectomy. There is a 16 mm right ovarian cyst. The left ovary is not visualized with certainty Other: None Musculoskeletal: Osteopenia with degenerative changes of the spine no acute osseous pathology. IMPRESSION: 1. Left upper lobe pleural based mass. 2. Innumerable small pulmonary nodules may represent metastatic disease versus atypical infection. Clinical correlation is recommended. 3. Segment of bowel wall thickening and luminal dilatation involving the duodenum, jejunum, as well as distal small bowel suspicious for bowel lymphoma. PET-CT is recommended for further evaluation of pulmonary and bowel findings. 4. Colonic diverticulosis.  No bowel obstruction. 5. Osteopenia. Heterogeneity of the spine may be related to osteopenia although small metastatic lesions are not excluded. 6. Left mastectomy. 7.  Aortic Atherosclerosis (ICD10-I70.0). Electronically Signed   By: Anner Crete M.D.   On: 07/22/2017 23:44   Ct Maxillofacial Wo Contrast  Result Date: 07/22/2017 CLINICAL DATA:  Pt c/o sinus pressure with pain and overall weakness since being diagnosed with sinus infection and uti at the end of June. pt complained of rt side swelling just below mandible EXAM: CT MAXILLOFACIAL WITHOUT CONTRAST TECHNIQUE: Multidetector CT imaging of the maxillofacial structures was performed. Multiplanar CT image reconstructions were also generated. COMPARISON:  None. 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 Multiple teeth are absent. There is a cavity of the remaining right maxillary molar. 1.0 by 0.6 by 0.8 cm lesion along the upper margin of the right frontal sinus has some endosteal scalloping and some faint spiculations of bone, but generally nonaggressive characteristics. There is some bony demineralization.  Cervical spondylosis noted. Orbits: Unremarkable Sinuses: Mild chronic right sphenoid sinusitis. Soft tissues: Posterior nasopharyngeal soft tissue fullness extending somewhat  into the right nasal cavity. Polypoid lesion or mass not excluded, and  inspection is recommended. Left level II lymph node 1.3 cm in short axis on image 81/2. Right level IIb lymph node 1.0 cm in short axis. Suspected leftward deviation of the glottis, although there is extensive motion artifact making this difficult to be certain of. Limited intracranial: Unremarkable IMPRESSION: 1. Posterior nasopharyngeal soft tissue fullness extending somewhat into the posterior aspect of the right nasal cavity. Malignancy is not excluded and inspection of the posterior nasopharynx is recommended. 2. Bilateral mild level 2 adenopathy, also raising some concern for potential malignancy. 3. Small expansile lesion with endosteal scalloping along the upper margin of the right frontal sinus. This has mostly nonaggressive characteristics, suggesting hemangioma, cholesterol granuloma, or similar lesion. 4. Bilateral torus mandibularis. 5. Cavity of the remaining right maxillary molar. 6. Mild chronic right sphenoid sinusitis. 7. Bony demineralization. Electronically Signed   By: Van Clines M.D.   On: 07/22/2017 21:53    Orson Eva, DO  [facility] Pager [phone]  If 7PM-7AM, please contact night-coverage [email or web address] Password [email or web address] 07/23/2017, 12:32 PM   LOS: 1 day

## 2017-07-23 NOTE — Progress Notes (Signed)
IR requested by Dr. Carles Collet for possible image-guided lung biopsy. Images/case reviewed by Dr. Laurence Ferrari. Per Dr. Laurence Ferrari, GI consulting for potential EGD with biopsy of bowel lesion- if successful, will not proceed with lung biopsy. IR available in future if needed.  Casey Graff Zylpha Poynor, PA-C 07/23/2017, 4:42 PM

## 2017-07-23 NOTE — Consult Note (Signed)
Reason for Consult: GI bleed Referring Physician: Hospitalist  Casey Lopez is an 77 y.o. female.  HPI: Admitted thru the ED yesterday. She c/o weakness . She also said she had a sinus infection. Noted to have a hemoglobin of  7.7 on admission.  Has received 2 units of blood. Hemoglobin 2 weeks ago 10.9.  She says her stools have been black for about 3 days. Has not been taking any Pepto Bismol. Took Aleve about 2 days ago. She says she does not take a lot of Aleve.  No other NSAIDs. She denies any epigastric pain.  No prior hx of melena.  Treated for a sinus infection and UTI a month ago by Dr. [place] Crane.  In the ED, her hemocult was positive.  Hx significant for breast cancer, depression, diabetes hypothyroid, hypertension, sickle cell trait.  She underwent a CT abdomen/pelvis which revealed IMPRESSION: 1. Left upper lobe pleural based mass. 2. Innumerable small pulmonary nodules may represent metastatic disease versus atypical infection. Clinical correlation is recommended. 3. Segment of bowel wall thickening and luminal dilatation involving the duodenum, jejunum, as well as distal small bowel suspicious for bowel lymphoma. PET-CT is recommended for further evaluation of pulmonary and bowel findings. 4. Colonic diverticulosis.  No bowel obstruction. She has a BM daily. She says for the past 3 days, her stools have been jet black. Her normal weight is 180. Admission weight 152. Wt loss over 2 months. Her appetite is good. She has had some nausea. She is a smoker. Smokes about 2 packs a week. Has not smoked in 4 days. Colonoscopy 3  Years ago Dr. Oneida Alar. 7 polyps removed. Moderate diverticulosis thru entire colon.   Past Medical History:  Diagnosis Date  . Cancer ([place])    left breast  . Depression 12/24/2010  . Goiter   . HTN (hypertension) 12/24/2010  . Hypothyroidism 12/24/2010  . Infiltrating ductal carcinoma of breast, stage 1 ([place]) 12/24/2010  . Sickle cell trait [facility])      Past Surgical History:  Procedure Laterality Date  . ABDOMINAL HYSTERECTOMY    . APPENDECTOMY    . BIOPSY THYROID    . CATARACT EXTRACTION     right eye with lens implant  . CATARACT EXTRACTION  2008   right  . CATARACT EXTRACTION W/PHACO  04/16/2011   Procedure: CATARACT EXTRACTION PHACO AND INTRAOCULAR LENS PLACEMENT (IOC);  Surgeon: Elta Guadeloupe T. Gershon Crane, MD;  Location: [facility] ORS;  Service: Ophthalmology;  Laterality: Left;  CDE=7.66  . CHOLECYSTECTOMY    . COLON RESECTION  1965   tumor 4X4 inch per patient. ?colon or other per patient  . COLONOSCOPY N/A 08/12/2014   Procedure: COLONOSCOPY;  Surgeon: Danie Binder, MD;  Location: [facility] ENDO SUITE;  Service: Endoscopy;  Laterality: N/A;  1300  . DENTAL SURGERY  11/2011  . HEMORRHOID SURGERY    . MASTECTOMY  2009   left  . TONSILECTOMY, ADENOIDECTOMY, BILATERAL MYRINGOTOMY AND TUBES      Family History  Problem Relation Age of Onset  . Breast cancer Sister   . Cancer Mother   . Sickle cell anemia Father   . Anesthesia problems Neg Hx   . Hypotension Neg Hx   . Malignant hyperthermia Neg Hx   . Pseudochol deficiency Neg Hx     Social History:  reports that she has never smoked. She has never used smokeless tobacco. She reports that she does not drink alcohol or use drugs.  Allergies:  Allergies  Allergen Reactions  .  Sulfa Antibiotics Swelling  . Feldene [Piroxicam]   . Penicillins Rash    Has patient had a PCN reaction causing immediate rash, facial/tongue/throat swelling, SOB or lightheadedness with hypotension: No Has patient had a PCN reaction causing severe rash involving mucus membranes or skin necrosis: No Has patient had a PCN reaction that required hospitalization: No Has patient had a PCN reaction occurring within the last 10 years: No If all of the above answers are "NO", then may proceed with Cephalosporin use.     Medications: I have reviewed the patient's current medications.  Results for orders placed or  performed during the hospital encounter of 07/22/17 (from the past 48 hour(s))  Urinalysis, Routine w reflex microscopic     Status: Abnormal   Collection Time: 07/22/17  8:50 PM  Result Value Ref Range   Color, Urine YELLOW YELLOW   APPearance HAZY (A) CLEAR   Specific Gravity, Urine 1.016 1.005 - 1.030   pH 5.0 5.0 - 8.0   Glucose, UA NEGATIVE NEGATIVE mg/dL   Hgb urine dipstick NEGATIVE NEGATIVE   Bilirubin Urine NEGATIVE NEGATIVE   Ketones, ur NEGATIVE NEGATIVE mg/dL   Protein, ur NEGATIVE NEGATIVE mg/dL   Nitrite NEGATIVE NEGATIVE   Leukocytes, UA LARGE (A) NEGATIVE   RBC / HPF 6-10 0 - 5 RBC/hpf   WBC, UA 21-50 0 - 5 WBC/hpf   Bacteria, UA RARE (A) NONE SEEN   Squamous Epithelial / LPF 6-10 0 - 5   Hyaline Casts, UA PRESENT     Comment: Performed at [facility], [address], [place], [place] [address]  Comprehensive metabolic panel     Status: Abnormal   Collection Time: 07/22/17  9:53 PM  Result Value Ref Range   Sodium 139 135 - 145 mmol/L   Potassium 4.3 3.5 - 5.1 mmol/L   Chloride 103 98 - 111 mmol/L    Comment: Please note change in reference range.   CO2 28 22 - 32 mmol/L   Glucose, Bld 92 70 - 99 mg/dL    Comment: Please note change in reference range.   BUN 22 8 - 23 mg/dL    Comment: Please note change in reference range.   Creatinine, Ser 1.16 (H) 0.44 - 1.00 mg/dL   Calcium 8.4 (L) 8.9 - 10.3 mg/dL   Total Protein 5.8 (L) 6.5 - 8.1 g/dL   Albumin 2.5 (L) 3.5 - 5.0 g/dL   AST 17 15 - 41 U/L   ALT 13 0 - 44 U/L    Comment: Please note change in reference range.   Alkaline Phosphatase 58 38 - 126 U/L   Total Bilirubin 0.4 0.3 - 1.2 mg/dL   GFR calc non Af Amer 44 (L) >60 mL/min   GFR calc Af Amer 51 (L) >60 mL/min    Comment: (NOTE) The eGFR has been calculated using the CKD EPI equation. This calculation has not been validated in all clinical situations. eGFR's persistently <60 mL/min signify possible Chronic Kidney Disease.    Anion gap 8 5 - 15     Comment: Performed at [facility], [address], [place], [place] [address]  CBC with Differential     Status: Abnormal   Collection Time: 07/22/17  9:53 PM  Result Value Ref Range   WBC 13.3 (H) 4.0 - 10.5 K/uL   RBC 2.64 (L) 3.87 - 5.11 MIL/uL   Hemoglobin 7.7 (L) 12.0 - 15.0 g/dL   HCT 23.9 (L) 36.0 - 46.0 %   MCV  90.5 78.0 - 100.0 fL   MCH 29.2 26.0 - 34.0 pg   MCHC 32.2 30.0 - 36.0 g/dL   RDW 13.7 11.5 - 15.5 %   Platelets 386 150 - 400 K/uL   Neutrophils Relative % 62 %   Neutro Abs 8.3 (H) 1.7 - 7.7 K/uL   Lymphocytes Relative 28 %   Lymphs Abs 3.7 0.7 - 4.0 K/uL   Monocytes Relative 8 %   Monocytes Absolute 1.1 (H) 0.1 - 1.0 K/uL   Eosinophils Relative 2 %   Eosinophils Absolute 0.2 0.0 - 0.7 K/uL   Basophils Relative 0 %   Basophils Absolute 0.0 0.0 - 0.1 K/uL    Comment: Performed at [facility], [address], [place], [place] [address]  POC occult blood, ED Provider will collect     Status: Abnormal   Collection Time: 07/22/17 10:26 PM  Result Value Ref Range   Fecal Occult Bld POSITIVE (A) NEGATIVE  Type and screen [facility]     Status: None (Preliminary result)   Collection Time: 07/22/17 10:47 PM  Result Value Ref Range   ABO/RH(D) O POS    Antibody Screen NEG    Sample Expiration 07/25/2017    Unit Number [id number]    Blood Component Type RED CELLS,LR    Unit division 00    Status of Unit ISSUED    Transfusion Status OK TO TRANSFUSE    Crossmatch Result Compatible    Unit Number [id number]    Blood Component Type RED CELLS,LR    Unit division 00    Status of Unit ISSUED    Transfusion Status OK TO TRANSFUSE    Crossmatch Result      Compatible Performed at [facility], [address] [place], [place] [address]   Prepare RBC     Status: None   Collection Time: 07/22/17 10:47 PM  Result Value Ref Range   Order Confirmation      ORDER PROCESSED BY BLOOD BANK Performed at [facility], [address], [place], [place] [address]    CBC with Differential/Platelet     Status: Abnormal   Collection Time: 07/23/17 10:04 AM  Result Value Ref Range   WBC 14.5 (H) 4.0 - 10.5 K/uL   RBC 3.51 (L) 3.87 - 5.11 MIL/uL   Hemoglobin 10.6 (L) 12.0 - 15.0 g/dL    Comment: DELTA CHECK NOTED POST TRANSFUSION SPECIMEN    HCT 32.2 (L) 36.0 - 46.0 %   MCV 91.7 78.0 - 100.0 fL   MCH 30.2 26.0 - 34.0 pg   MCHC 32.9 30.0 - 36.0 g/dL   RDW 14.4 11.5 - 15.5 %   Platelets 314 150 - 400 K/uL   Neutrophils Relative % 75 %   Neutro Abs 10.9 (H) 1.7 - 7.7 K/uL   Lymphocytes Relative 17 %   Lymphs Abs 2.5 0.7 - 4.0 K/uL   Monocytes Relative 7 %   Monocytes Absolute 1.0 0.1 - 1.0 K/uL   Eosinophils Relative 1 %   Eosinophils Absolute 0.1 0.0 - 0.7 K/uL   Basophils Relative 0 %   Basophils Absolute 0.0 0.0 - 0.1 K/uL    Comment: Performed at [facility], [address], [place], [place] [address]  Basic metabolic panel     Status: Abnormal   Collection Time: 07/23/17 10:04 AM  Result Value Ref Range   Sodium 140 135 - 145 mmol/L   Potassium 4.3 3.5 - 5.1 mmol/L   Chloride 106 98 -  111 mmol/L    Comment: Please note change in reference range.   CO2 27 22 - 32 mmol/L   Glucose, Bld 108 (H) 70 - 99 mg/dL    Comment: Please note change in reference range.   BUN 15 8 - 23 mg/dL    Comment: Please note change in reference range.   Creatinine, Ser 1.00 0.44 - 1.00 mg/dL   Calcium 8.2 (L) 8.9 - 10.3 mg/dL   GFR calc non Af Amer 53 (L) >60 mL/min   GFR calc Af Amer >60 >60 mL/min    Comment: (NOTE) The eGFR has been calculated using the CKD EPI equation. This calculation has not been validated in all clinical situations. eGFR's persistently <60 mL/min signify possible Chronic Kidney Disease.    Anion gap 7 5 - 15    Comment: Performed at [facility], [address] [place],  [address]    Dg Chest 2 View  Result Date: 07/22/2017 CLINICAL DATA:  Weakness. Sinus infection. History of breast cancer. EXAM: CHEST - 2 VIEW  COMPARISON:  Chest radiograph April 08, 2007 FINDINGS: Homogeneously dense 4.5 cm LEFT upper lobe mass, possible pleural based. No pleural effusion. Mild bronchitic changes. Cardiomediastinal silhouette is normal. Calcified aortic arch. Surgical clips LEFT chest wall. Osseous structures are nonacute. IMPRESSION: 1. New LEFT upper lobe mass, potentially pleural based. Recommend CT chest with contrast. 2. Mild bronchitic changes. 3.  Aortic Atherosclerosis (ICD10-I70.0). 4. Acute findings discussed with and reconfirmed by Dr.JULIE HAVILAND on 07/22/2017 at 9:58 pm. Electronically Signed   By: Elon Alas M.D.   On: 07/22/2017 21:59   Ct Chest W Contrast  Result Date: 07/22/2017 CLINICAL DATA:  77 year old female with rectal bleeding. EXAM: CT CHEST, ABDOMEN, AND PELVIS WITH CONTRAST TECHNIQUE: Multidetector CT imaging of the chest, abdomen and pelvis was performed following the standard protocol during bolus administration of intravenous contrast. CONTRAST:  8m OMNIPAQUE IOHEXOL 300 MG/ML  SOLN COMPARISON:  None. FINDINGS: CT CHEST FINDINGS Cardiovascular: There is no cardiomegaly or pericardial effusion. There is coronary vascular calcification primarily involving the LAD. Mild atherosclerotic calcification of the aortic arch. The visualized origins of the great vessels of the aortic arch are patent. The central pulmonary arteries are grossly unremarkable. Mediastinum/Nodes: There is no hilar or mediastinal adenopathy. The esophagus is grossly unremarkable. There is a 2.5 x 3.9 cm right thyroid cystic or necrotic nodule with multiple enhancing intracystic nodules. Further evaluation with thyroid ultrasound recommended. Additional smaller nodules noted in the left thyroid lobe. Lungs/Pleura: There is a 4.5 x 5.2 cm pleural based mass in the left upper lobe. There are multiple small bilateral nodular densities which may represent metastatic disease, or an atypical infection. There is no pleural effusion or  pneumothorax. The central airways are patent. Musculoskeletal: There is no axillary adenopathy. Surgical clips of the left axilla and left-sided mastectomy noted. There is osteopenia with degenerative changes of the spine. There is diffuse heterogeneity of the spine which may be secondary to osteopenia or represent small metastatic disease. CT ABDOMEN PELVIS FINDINGS There is no intra-abdominal free air or free fluid. Hepatobiliary: Subcentimeter hypodense lesion in the anterior liver (series 2, image 46) is too small to characterize. No intrahepatic biliary ductal dilatation. Cholecystectomy. Pancreas: Unremarkable. No pancreatic ductal dilatation or surrounding inflammatory changes. Spleen: Normal in size without focal abnormality. Adrenals/Urinary Tract: The adrenal glands are unremarkable. Multiple bilateral renal cysts as well as smaller hypodense lesions which are too small to characterize. There is no hydronephrosis on either side.  There is symmetric enhancement and excretion of contrast by both kidneys. The visualized ureters and urinary bladder appear unremarkable. Stomach/Bowel: There is thickening of the wall of the duodenum and jejunal loops in the left upper abdomen with luminal dilatation highly suspicious for an infiltrative/neoplastic process such as lymphoma. There is thickening and luminal dilatation of a segment of bowel in the pelvis also concerning for lymphoma. Mural hemorrhage within the bowel wall is a less likely possibility if the patient is on anticoagulation. There is no bowel obstruction. There is colonic diverticulosis without active inflammatory changes. Appendectomy. Vascular/Lymphatic: Moderate aortoiliac atherosclerotic disease. No portal venous gas. There is no adenopathy. Reproductive: Hysterectomy. There is a 16 mm right ovarian cyst. The left ovary is not visualized with certainty Other: None Musculoskeletal: Osteopenia with degenerative changes of the spine no acute osseous  pathology. IMPRESSION: 1. Left upper lobe pleural based mass. 2. Innumerable small pulmonary nodules may represent metastatic disease versus atypical infection. Clinical correlation is recommended. 3. Segment of bowel wall thickening and luminal dilatation involving the duodenum, jejunum, as well as distal small bowel suspicious for bowel lymphoma. PET-CT is recommended for further evaluation of pulmonary and bowel findings. 4. Colonic diverticulosis.  No bowel obstruction. 5. Osteopenia. Heterogeneity of the spine may be related to osteopenia although small metastatic lesions are not excluded. 6. Left mastectomy. 7.  Aortic Atherosclerosis (ICD10-I70.0). Electronically Signed   By: Anner Crete M.D.   On: 07/22/2017 23:44   Ct Abdomen Pelvis W Contrast  Result Date: 07/22/2017 CLINICAL DATA:  77 year old female with rectal bleeding. EXAM: CT CHEST, ABDOMEN, AND PELVIS WITH CONTRAST TECHNIQUE: Multidetector CT imaging of the chest, abdomen and pelvis was performed following the standard protocol during bolus administration of intravenous contrast. CONTRAST:  23m OMNIPAQUE IOHEXOL 300 MG/ML  SOLN COMPARISON:  None. FINDINGS: CT CHEST FINDINGS Cardiovascular: There is no cardiomegaly or pericardial effusion. There is coronary vascular calcification primarily involving the LAD. Mild atherosclerotic calcification of the aortic arch. The visualized origins of the great vessels of the aortic arch are patent. The central pulmonary arteries are grossly unremarkable. Mediastinum/Nodes: There is no hilar or mediastinal adenopathy. The esophagus is grossly unremarkable. There is a 2.5 x 3.9 cm right thyroid cystic or necrotic nodule with multiple enhancing intracystic nodules. Further evaluation with thyroid ultrasound recommended. Additional smaller nodules noted in the left thyroid lobe. Lungs/Pleura: There is a 4.5 x 5.2 cm pleural based mass in the left upper lobe. There are multiple small bilateral nodular  densities which may represent metastatic disease, or an atypical infection. There is no pleural effusion or pneumothorax. The central airways are patent. Musculoskeletal: There is no axillary adenopathy. Surgical clips of the left axilla and left-sided mastectomy noted. There is osteopenia with degenerative changes of the spine. There is diffuse heterogeneity of the spine which may be secondary to osteopenia or represent small metastatic disease. CT ABDOMEN PELVIS FINDINGS There is no intra-abdominal free air or free fluid. Hepatobiliary: Subcentimeter hypodense lesion in the anterior liver (series 2, image 46) is too small to characterize. No intrahepatic biliary ductal dilatation. Cholecystectomy. Pancreas: Unremarkable. No pancreatic ductal dilatation or surrounding inflammatory changes. Spleen: Normal in size without focal abnormality. Adrenals/Urinary Tract: The adrenal glands are unremarkable. Multiple bilateral renal cysts as well as smaller hypodense lesions which are too small to characterize. There is no hydronephrosis on either side. There is symmetric enhancement and excretion of contrast by both kidneys. The visualized ureters and urinary bladder appear unremarkable. Stomach/Bowel: There is thickening of the  wall of the duodenum and jejunal loops in the left upper abdomen with luminal dilatation highly suspicious for an infiltrative/neoplastic process such as lymphoma. There is thickening and luminal dilatation of a segment of bowel in the pelvis also concerning for lymphoma. Mural hemorrhage within the bowel wall is a less likely possibility if the patient is on anticoagulation. There is no bowel obstruction. There is colonic diverticulosis without active inflammatory changes. Appendectomy. Vascular/Lymphatic: Moderate aortoiliac atherosclerotic disease. No portal venous gas. There is no adenopathy. Reproductive: Hysterectomy. There is a 16 mm right ovarian cyst. The left ovary is not visualized with  certainty Other: None Musculoskeletal: Osteopenia with degenerative changes of the spine no acute osseous pathology. IMPRESSION: 1. Left upper lobe pleural based mass. 2. Innumerable small pulmonary nodules may represent metastatic disease versus atypical infection. Clinical correlation is recommended. 3. Segment of bowel wall thickening and luminal dilatation involving the duodenum, jejunum, as well as distal small bowel suspicious for bowel lymphoma. PET-CT is recommended for further evaluation of pulmonary and bowel findings. 4. Colonic diverticulosis.  No bowel obstruction. 5. Osteopenia. Heterogeneity of the spine may be related to osteopenia although small metastatic lesions are not excluded. 6. Left mastectomy. 7.  Aortic Atherosclerosis (ICD10-I70.0). Electronically Signed   By: Anner Crete M.D.   On: 07/22/2017 23:44   Ct Maxillofacial Wo Contrast  Result Date: 07/22/2017 CLINICAL DATA:  Pt c/o sinus pressure with pain and overall weakness since being diagnosed with sinus infection and uti at the end of June. pt complained of rt side swelling just below mandible EXAM: CT MAXILLOFACIAL WITHOUT CONTRAST TECHNIQUE: Multidetector CT imaging of the maxillofacial structures was performed. Multiplanar CT image reconstructions were also generated. COMPARISON:  None. 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 Multiple teeth are absent. There is a cavity of the remaining right maxillary molar. 1.0 by 0.6 by 0.8 cm lesion along the upper margin of the right frontal sinus has some endosteal scalloping and some faint spiculations of bone, but generally nonaggressive characteristics. There is some bony demineralization.  Cervical spondylosis noted. Orbits: Unremarkable Sinuses: Mild chronic right sphenoid sinusitis. Soft tissues: Posterior nasopharyngeal soft tissue fullness extending somewhat into the right nasal cavity. Polypoid lesion or mass not excluded, and inspection is  recommended. Left level II lymph node 1.3 cm in short axis on image 81/2. Right level IIb lymph node 1.0 cm in short axis. Suspected leftward deviation of the glottis, although there is extensive motion artifact making this difficult to be certain of. Limited intracranial: Unremarkable IMPRESSION: 1. Posterior nasopharyngeal soft tissue fullness extending somewhat into the posterior aspect of the right nasal cavity. Malignancy is not excluded and inspection of the posterior nasopharynx is recommended. 2. Bilateral mild level 2 adenopathy, also raising some concern for potential malignancy. 3. Small expansile lesion with endosteal scalloping along the upper margin of the right frontal sinus. This has mostly nonaggressive characteristics, suggesting hemangioma, cholesterol granuloma, or similar lesion. 4. Bilateral torus mandibularis. 5. Cavity of the remaining right maxillary molar. 6. Mild chronic right sphenoid sinusitis. 7. Bony demineralization. Electronically Signed   By: Van Clines M.D.   On: 07/22/2017 21:53    ROS Blood pressure (!) 120/56, pulse 88, temperature 97.6 F (36.4 C), temperature source Oral, resp. rate 16, height '5\' 7"'$  (1.702 m), weight 152 lb 6.4 oz (69.1 kg), SpO2 100 %. Physical Exam Alert and oriented. Skin warm and dry. Oral mucosa is moist.   . Sclera anicteric, conjunctivae is pink. Thyroid not enlarged.  No cervical lymphadenopathy. Lungs clear. Heart regular rate and rhythm.  Abdomen is soft. Bowel sounds are positive. No hepatomegaly. No abdominal masses felt. No tenderness.  No edema to lower extremities..Noted to bleeding from her nose when she blow it.    Assessment/Plan: GI bleed. Melena. Abnormal CT. Will discuss with Dr. Laural Golden. Possible EGD. Agree with Protonix.  Artesha Wemhoff L Arthi Mcdonald 07/23/2017, 1:24 PM

## 2017-07-24 ENCOUNTER — Encounter (HOSPITAL_COMMUNITY)
Admission: EM | Disposition: A | Discharge status: Home / Self Care | Payer: Self-pay | Source: Home / Self Care | Attending: Internal Medicine

## 2017-07-24 ENCOUNTER — Inpatient Hospital Stay (HOSPITAL_COMMUNITY): Payer: Medicare Other

## 2017-07-24 ENCOUNTER — Encounter (HOSPITAL_COMMUNITY): Payer: Self-pay | Admitting: Radiology

## 2017-07-24 ENCOUNTER — Inpatient Hospital Stay (HOSPITAL_COMMUNITY): Payer: Medicare Other | Admitting: Anesthesiology

## 2017-07-24 ENCOUNTER — Other Ambulatory Visit: Payer: Self-pay

## 2017-07-24 DIAGNOSIS — E038 Other specified hypothyroidism: Secondary | ICD-10-CM

## 2017-07-24 DIAGNOSIS — R930 Abnormal findings on diagnostic imaging of skull and head, not elsewhere classified: Secondary | ICD-10-CM

## 2017-07-24 DIAGNOSIS — C799 Secondary malignant neoplasm of unspecified site: Secondary | ICD-10-CM

## 2017-07-24 DIAGNOSIS — R918 Other nonspecific abnormal finding of lung field: Secondary | ICD-10-CM

## 2017-07-24 DIAGNOSIS — R935 Abnormal findings on diagnostic imaging of other abdominal regions, including retroperitoneum: Secondary | ICD-10-CM

## 2017-07-24 DIAGNOSIS — D649 Anemia, unspecified: Secondary | ICD-10-CM

## 2017-07-24 DIAGNOSIS — R9389 Abnormal findings on diagnostic imaging of other specified body structures: Secondary | ICD-10-CM

## 2017-07-24 DIAGNOSIS — K922 Gastrointestinal hemorrhage, unspecified: Principal | ICD-10-CM

## 2017-07-24 HISTORY — PX: ESOPHAGOGASTRODUODENOSCOPY (EGD) WITH PROPOFOL: SHX5813

## 2017-07-24 HISTORY — PX: BIOPSY: SHX5522

## 2017-07-24 LAB — CBC WITH DIFFERENTIAL/PLATELET
BASOS ABS: 0 10*3/uL (ref 0.0–0.1)
Basophils Relative: 0 %
EOS ABS: 0.1 10*3/uL (ref 0.0–0.7)
Eosinophils Relative: 1 %
HEMATOCRIT: 31.7 % — AB (ref 36.0–46.0)
HEMOGLOBIN: 10.2 g/dL — AB (ref 12.0–15.0)
Lymphocytes Relative: 16 %
Lymphs Abs: 2.2 10*3/uL (ref 0.7–4.0)
MCH: 29.7 pg (ref 26.0–34.0)
MCHC: 32.2 g/dL (ref 30.0–36.0)
MCV: 92.2 fL (ref 78.0–100.0)
MONO ABS: 1.2 10*3/uL — AB (ref 0.1–1.0)
Monocytes Relative: 9 %
NEUTROS ABS: 10.6 10*3/uL — AB (ref 1.7–7.7)
NEUTROS PCT: 74 %
Platelets: 278 10*3/uL (ref 150–400)
RBC: 3.44 MIL/uL — ABNORMAL LOW (ref 3.87–5.11)
RDW: 14.5 % (ref 11.5–15.5)
WBC: 14 10*3/uL — ABNORMAL HIGH (ref 4.0–10.5)

## 2017-07-24 LAB — TYPE AND SCREEN
ABO/RH(D): O POS
Antibody Screen: NEGATIVE
Unit division: 0
Unit division: 0

## 2017-07-24 LAB — BASIC METABOLIC PANEL
ANION GAP: 6 (ref 5–15)
BUN: 12 mg/dL (ref 8–23)
CALCIUM: 8.3 mg/dL — AB (ref 8.9–10.3)
CO2: 29 mmol/L (ref 22–32)
CREATININE: 0.81 mg/dL (ref 0.44–1.00)
Chloride: 104 mmol/L (ref 98–111)
Glucose, Bld: 110 mg/dL — ABNORMAL HIGH (ref 70–99)
Potassium: 4 mmol/L (ref 3.5–5.1)
Sodium: 139 mmol/L (ref 135–145)

## 2017-07-24 LAB — BPAM RBC
BLOOD PRODUCT EXPIRATION DATE: 201908172359
Blood Product Expiration Date: 201908172359
ISSUE DATE / TIME: 201907170201
ISSUE DATE / TIME: 201907170508
UNIT TYPE AND RH: 5100
UNIT TYPE AND RH: 5100

## 2017-07-24 LAB — HEMOGLOBIN A1C
HEMOGLOBIN A1C: 5.9 % — AB (ref 4.8–5.6)
MEAN PLASMA GLUCOSE: 122.63 mg/dL

## 2017-07-24 SURGERY — ESOPHAGOGASTRODUODENOSCOPY (EGD) WITH PROPOFOL
Anesthesia: General

## 2017-07-24 MED ORDER — IOHEXOL 300 MG/ML  SOLN
75.0000 mL | Freq: Once | INTRAMUSCULAR | Status: AC | PRN
  Administered 2017-07-24: 75 mL via INTRAVENOUS

## 2017-07-24 MED ORDER — LACTATED RINGERS IV SOLN
INTRAVENOUS | Status: DC
  Administered 2017-07-24: 14:00:00 via INTRAVENOUS

## 2017-07-24 MED ORDER — PROPOFOL 10 MG/ML IV BOLUS
INTRAVENOUS | Status: DC | PRN
  Administered 2017-07-24: 40 mg via INTRAVENOUS

## 2017-07-24 MED ORDER — SODIUM CHLORIDE 0.9 % IV SOLN: INTRAVENOUS | Status: DC

## 2017-07-24 MED ORDER — FENTANYL CITRATE (PF) 100 MCG/2ML IJ SOLN: 25.0000 ug | INTRAMUSCULAR | Status: DC | PRN

## 2017-07-24 MED ORDER — MORPHINE SULFATE (PF) 2 MG/ML IV SOLN
2.0000 mg | Freq: Once | INTRAVENOUS | Status: AC
  Administered 2017-07-24: 2 mg via INTRAVENOUS
  Filled 2017-07-24: qty 1

## 2017-07-24 MED ORDER — PROPOFOL 500 MG/50ML IV EMUL
INTRAVENOUS | Status: DC | PRN
  Administered 2017-07-24: 125 ug/kg/min via INTRAVENOUS

## 2017-07-24 MED ORDER — GLYCOPYRROLATE 0.2 MG/ML IJ SOLN
INTRAMUSCULAR | Status: AC
  Filled 2017-07-24: qty 1

## 2017-07-24 MED ORDER — HYDROCODONE-ACETAMINOPHEN 5-325 MG PO TABS
1.0000 | ORAL_TABLET | ORAL | Status: DC | PRN
  Administered 2017-07-24 – 2017-07-26 (×7): 1 via ORAL
  Filled 2017-07-24 (×6): qty 1

## 2017-07-24 MED ORDER — LIDOCAINE HCL (PF) 1 % IJ SOLN
INTRAMUSCULAR | Status: AC
  Filled 2017-07-24: qty 5

## 2017-07-24 NOTE — Progress Notes (Signed)
Subjective: She feels well. Denies abdominal pain, N/V. No BM in over 24 hours. Ready for EGD. No other GI complaints.  Objective: Vital signs in last 24 hours: Temp:  [97.6 F (36.4 C)-98.4 F (36.9 C)] 98.1 F (36.7 C) (07/18 0603) Pulse Rate:  [80-94] 94 (07/18 0603) Resp:  [16-19] 17 (07/18 0603) BP: (108-120)/(45-59) 117/59 (07/18 0603) SpO2:  [93 %-100 %] 93 % (07/18 0603) Last BM Date: 07/22/17 General:   Alert and oriented, pleasant Head:  Normocephalic and atraumatic. Eyes:  No icterus, sclera clear. Conjuctiva pink.  Heart:  S1, S2 present, no murmurs noted.  Lungs: Clear to auscultation bilaterally, without wheezing, rales, or rhonchi.  Abdomen:  Bowel sounds present, soft, non-tender, non-distended. No HSM or hernias noted. No rebound or guarding. Msk:  Symmetrical without gross deformities. Pulses:  Normal pulses noted. Extremities:  Without clubbing or edema. Neurologic:  Alert and  oriented x4;  grossly normal neurologically. Skin:  Warm and dry, intact without significant lesions.  Psych:  Alert and cooperative. Normal mood and affect.  Intake/Output from previous day: 07/17 0701 - 07/18 0700 In: 877.5 [I.V.:457.5; Blood:420] Out: 300 [Urine:300] Intake/Output this shift: No intake/output data recorded.  Lab Results: Recent Labs    07/22/17 2153 07/23/17 1004 07/23/17 1448 07/24/17 0639  WBC 13.3* 14.5*  --  14.0*  HGB 7.7* 10.6* 10.2* 10.2*  HCT 23.9* 32.2* 31.0* 31.7*  PLT 386 314  --  278   BMET Recent Labs    07/22/17 2153 07/23/17 1004 07/24/17 0639  NA 139 140 139  K 4.3 4.3 4.0  CL 103 106 104  CO2 _0 GLUCOSE 92 108* 110*  BUN _1 CREATININE 1.16* 1.00 0.81  CALCIUM 8.4* 8.2* 8.3*   LFT Recent Labs    07/22/17 2153  PROT 5.8*  ALBUMIN 2.5*  AST 17  ALT 13  ALKPHOS 58  BILITOT 0.4   PT/INR No results for input(s): LABPROT, INR in the last 72 hours. Hepatitis Panel No results for input(s): HEPBSAG,  HCVAB, HEPAIGM, HEPBIGM in the last 72 hours.   Studies/Results: Dg Chest 2 View  Result Date: 07/22/2017 CLINICAL DATA:  Weakness. Sinus infection. History of breast cancer. EXAM: CHEST - 2 VIEW COMPARISON:  Chest radiograph April 08, 2007 FINDINGS: Homogeneously dense 4.5 cm LEFT upper lobe mass, possible pleural based. No pleural effusion. Mild bronchitic changes. Cardiomediastinal silhouette is normal. Calcified aortic arch. Surgical clips LEFT chest wall. Osseous structures are nonacute. IMPRESSION: 1. New LEFT upper lobe mass, potentially pleural based. Recommend CT chest with contrast. 2. Mild bronchitic changes. 3.  Aortic Atherosclerosis (ICD10-I70.0). 4. Acute findings discussed with and reconfirmed by Dr.JULIE HAVILAND on 07/22/2017 at 9:58 pm. Electronically Signed   By: Elon Alas M.D.   On: 07/22/2017 21:59   Ct Chest W Contrast  Result Date: 07/22/2017 CLINICAL DATA:  77 year old female with rectal bleeding. EXAM: CT CHEST, ABDOMEN, AND PELVIS WITH CONTRAST TECHNIQUE: Multidetector CT imaging of the chest, abdomen and pelvis was performed following the standard protocol during bolus administration of intravenous contrast. CONTRAST:  50m OMNIPAQUE IOHEXOL 300 MG/ML  SOLN COMPARISON:  None. FINDINGS: CT CHEST FINDINGS Cardiovascular: There is no cardiomegaly or pericardial effusion. There is coronary vascular calcification primarily involving the LAD. Mild atherosclerotic calcification of the aortic arch. The visualized origins of the great vessels of the aortic arch are patent. The central pulmonary arteries are grossly unremarkable. Mediastinum/Nodes: There is no hilar or mediastinal adenopathy. The esophagus  is grossly unremarkable. There is a 2.5 x 3.9 cm right thyroid cystic or necrotic nodule with multiple enhancing intracystic nodules. Further evaluation with thyroid ultrasound recommended. Additional smaller nodules noted in the left thyroid lobe. Lungs/Pleura: There is a 4.5 x  5.2 cm pleural based mass in the left upper lobe. There are multiple small bilateral nodular densities which may represent metastatic disease, or an atypical infection. There is no pleural effusion or pneumothorax. The central airways are patent. Musculoskeletal: There is no axillary adenopathy. Surgical clips of the left axilla and left-sided mastectomy noted. There is osteopenia with degenerative changes of the spine. There is diffuse heterogeneity of the spine which may be secondary to osteopenia or represent small metastatic disease. CT ABDOMEN PELVIS FINDINGS There is no intra-abdominal free air or free fluid. Hepatobiliary: Subcentimeter hypodense lesion in the anterior liver (series 2, image 46) is too small to characterize. No intrahepatic biliary ductal dilatation. Cholecystectomy. Pancreas: Unremarkable. No pancreatic ductal dilatation or surrounding inflammatory changes. Spleen: Normal in size without focal abnormality. Adrenals/Urinary Tract: The adrenal glands are unremarkable. Multiple bilateral renal cysts as well as smaller hypodense lesions which are too small to characterize. There is no hydronephrosis on either side. There is symmetric enhancement and excretion of contrast by both kidneys. The visualized ureters and urinary bladder appear unremarkable. Stomach/Bowel: There is thickening of the wall of the duodenum and jejunal loops in the left upper abdomen with luminal dilatation highly suspicious for an infiltrative/neoplastic process such as lymphoma. There is thickening and luminal dilatation of a segment of bowel in the pelvis also concerning for lymphoma. Mural hemorrhage within the bowel wall is a less likely possibility if the patient is on anticoagulation. There is no bowel obstruction. There is colonic diverticulosis without active inflammatory changes. Appendectomy. Vascular/Lymphatic: Moderate aortoiliac atherosclerotic disease. No portal venous gas. There is no adenopathy.  Reproductive: Hysterectomy. There is a 16 mm right ovarian cyst. The left ovary is not visualized with certainty Other: None Musculoskeletal: Osteopenia with degenerative changes of the spine no acute osseous pathology. IMPRESSION: 1. Left upper lobe pleural based mass. 2. Innumerable small pulmonary nodules may represent metastatic disease versus atypical infection. Clinical correlation is recommended. 3. Segment of bowel wall thickening and luminal dilatation involving the duodenum, jejunum, as well as distal small bowel suspicious for bowel lymphoma. PET-CT is recommended for further evaluation of pulmonary and bowel findings. 4. Colonic diverticulosis.  No bowel obstruction. 5. Osteopenia. Heterogeneity of the spine may be related to osteopenia although small metastatic lesions are not excluded. 6. Left mastectomy. 7.  Aortic Atherosclerosis (ICD10-I70.0). Electronically Signed   By: Anner Crete M.D.   On: 07/22/2017 23:44   Ct Abdomen Pelvis W Contrast  Result Date: 07/22/2017 CLINICAL DATA:  77 year old female with rectal bleeding. EXAM: CT CHEST, ABDOMEN, AND PELVIS WITH CONTRAST TECHNIQUE: Multidetector CT imaging of the chest, abdomen and pelvis was performed following the standard protocol during bolus administration of intravenous contrast. CONTRAST:  82m OMNIPAQUE IOHEXOL 300 MG/ML  SOLN COMPARISON:  None. FINDINGS: CT CHEST FINDINGS Cardiovascular: There is no cardiomegaly or pericardial effusion. There is coronary vascular calcification primarily involving the LAD. Mild atherosclerotic calcification of the aortic arch. The visualized origins of the great vessels of the aortic arch are patent. The central pulmonary arteries are grossly unremarkable. Mediastinum/Nodes: There is no hilar or mediastinal adenopathy. The esophagus is grossly unremarkable. There is a 2.5 x 3.9 cm right thyroid cystic or necrotic nodule with multiple enhancing intracystic nodules. Further evaluation with thyroid  ultrasound recommended. Additional smaller nodules noted in the left thyroid lobe. Lungs/Pleura: There is a 4.5 x 5.2 cm pleural based mass in the left upper lobe. There are multiple small bilateral nodular densities which may represent metastatic disease, or an atypical infection. There is no pleural effusion or pneumothorax. The central airways are patent. Musculoskeletal: There is no axillary adenopathy. Surgical clips of the left axilla and left-sided mastectomy noted. There is osteopenia with degenerative changes of the spine. There is diffuse heterogeneity of the spine which may be secondary to osteopenia or represent small metastatic disease. CT ABDOMEN PELVIS FINDINGS There is no intra-abdominal free air or free fluid. Hepatobiliary: Subcentimeter hypodense lesion in the anterior liver (series 2, image 46) is too small to characterize. No intrahepatic biliary ductal dilatation. Cholecystectomy. Pancreas: Unremarkable. No pancreatic ductal dilatation or surrounding inflammatory changes. Spleen: Normal in size without focal abnormality. Adrenals/Urinary Tract: The adrenal glands are unremarkable. Multiple bilateral renal cysts as well as smaller hypodense lesions which are too small to characterize. There is no hydronephrosis on either side. There is symmetric enhancement and excretion of contrast by both kidneys. The visualized ureters and urinary bladder appear unremarkable. Stomach/Bowel: There is thickening of the wall of the duodenum and jejunal loops in the left upper abdomen with luminal dilatation highly suspicious for an infiltrative/neoplastic process such as lymphoma. There is thickening and luminal dilatation of a segment of bowel in the pelvis also concerning for lymphoma. Mural hemorrhage within the bowel wall is a less likely possibility if the patient is on anticoagulation. There is no bowel obstruction. There is colonic diverticulosis without active inflammatory changes. Appendectomy.  Vascular/Lymphatic: Moderate aortoiliac atherosclerotic disease. No portal venous gas. There is no adenopathy. Reproductive: Hysterectomy. There is a 16 mm right ovarian cyst. The left ovary is not visualized with certainty Other: None Musculoskeletal: Osteopenia with degenerative changes of the spine no acute osseous pathology. IMPRESSION: 1. Left upper lobe pleural based mass. 2. Innumerable small pulmonary nodules may represent metastatic disease versus atypical infection. Clinical correlation is recommended. 3. Segment of bowel wall thickening and luminal dilatation involving the duodenum, jejunum, as well as distal small bowel suspicious for bowel lymphoma. PET-CT is recommended for further evaluation of pulmonary and bowel findings. 4. Colonic diverticulosis.  No bowel obstruction. 5. Osteopenia. Heterogeneity of the spine may be related to osteopenia although small metastatic lesions are not excluded. 6. Left mastectomy. 7.  Aortic Atherosclerosis (ICD10-I70.0). Electronically Signed   By: Anner Crete M.D.   On: 07/22/2017 23:44   Ct Maxillofacial Wo Contrast  Result Date: 07/22/2017 CLINICAL DATA:  Pt c/o sinus pressure with pain and overall weakness since being diagnosed with sinus infection and uti at the end of June. pt complained of rt side swelling just below mandible EXAM: CT MAXILLOFACIAL WITHOUT CONTRAST TECHNIQUE: Multidetector CT imaging of the maxillofacial structures was performed. Multiplanar CT image reconstructions were also generated. COMPARISON:  None. 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 Multiple teeth are absent. There is a cavity of the remaining right maxillary molar. 1.0 by 0.6 by 0.8 cm lesion along the upper margin of the right frontal sinus has some endosteal scalloping and some faint spiculations of bone, but generally nonaggressive characteristics. There is some bony demineralization.  Cervical spondylosis noted. Orbits: Unremarkable  Sinuses: Mild chronic right sphenoid sinusitis. Soft tissues: Posterior nasopharyngeal soft tissue fullness extending somewhat into the right nasal cavity. Polypoid lesion or mass not excluded, and inspection is recommended. Left level II lymph node  1.3 cm in short axis on image 81/2. Right level IIb lymph node 1.0 cm in short axis. Suspected leftward deviation of the glottis, although there is extensive motion artifact making this difficult to be certain of. Limited intracranial: Unremarkable IMPRESSION: 1. Posterior nasopharyngeal soft tissue fullness extending somewhat into the posterior aspect of the right nasal cavity. Malignancy is not excluded and inspection of the posterior nasopharynx is recommended. 2. Bilateral mild level 2 adenopathy, also raising some concern for potential malignancy. 3. Small expansile lesion with endosteal scalloping along the upper margin of the right frontal sinus. This has mostly nonaggressive characteristics, suggesting hemangioma, cholesterol granuloma, or similar lesion. 4. Bilateral torus mandibularis. 5. Cavity of the remaining right maxillary molar. 6. Mild chronic right sphenoid sinusitis. 7. Bony demineralization. Electronically Signed   By: Van Clines M.D.   On: 07/22/2017 21:53    Assessment: Unfortunate situation of a 77 year old family with history of breast CA who presented with significant sinus pain/pressure, weakness, and 28 lb weight loss in 2-3 months. She underwent imaging which found multiple masses including maxiofacial soft tissues, multiple lung nodules, wall thickening and luminal dilation of the duodenum, jejunum, and distal small bowel. Query multiple primary cancers vs metestatic disease from single primary vs recurrence of breast CA with METS. IR was consulted as well as transfer to [facility] for multidisciplinary evaluation (including ENT) for lung biopsy but has deferred for now pending GI diagnostic workup for tissue sampling.  As an aside,  she had melena with a drop in hgb in the setting of small bowel imaging abnormalities and was transfused 2 u PRBC. Her hgb today is stable at 10.2. Query small bowel lesions blood loss although PUD, duodenal erosions/ulcers, esophagitis and the like remain in the differentials.  Dr. Laural Golden who saw the patient yesterday is recommending EGD today for tissue diagnosis (it is felt the duodenal lesions could be reached by the endoscope).  Today the patient does not have any GI complaints.  Her hemoglobin is stable.  She has not had a bowel movement in 24 hours (subsequently no melena or hematochezia).  The case was discussed with Dr. Gala Romney and we will plan for EGD at home today.  Proceed with EGD with Dr. Gala Romney in near future: the risks, benefits, and alternatives have been discussed with the patient in detail. The patient states understanding and desires to proceed.  We will plan for propofol/MAC given home Xanax use in order to promote adequate sedation.  Plan: 1. EGD today on propofol 2. Monitor for recurrent GI bleed 3. Monitor hgb 4. Transfuse as necessary 5. Will need ENT consult as soon as possible 6. Would benefit from multidisciplinary approach given extent of apparent disease 7. Supportive measure   Thank you for allowing [place] to participate in the care of [place], DNP, AGNP-C Adult & Gerontological Nurse Practitioner [facility]     LOS: 2 days    07/24/2017, 8:02 AM

## 2017-07-24 NOTE — Op Note (Signed)
[facility] Patient Name: Casey Lopez Procedure Date: 07/24/2017 2:37 PM MRN: [id number] Date of Birth: [birth year] Attending MD: Norvel Richards , MD CSN: [id number] Age: 77 Admit Type: Inpatient Procedure:                Upper GI endoscopy Indications:              Abnormal CT of the GI tract Providers:                Norvel Richards, MD, Janeece Riggers, RN, Bonnetta Barry, Technician Referring MD:              Medicines:                Propofol per Anesthesia Complications:            No immediate complications. Estimated Blood Loss:     Estimated blood loss was minimal. Procedure:                Pre-Anesthesia Assessment:                           - Prior to the procedure, a History and Physical                            was performed, and patient medications and                            allergies were reviewed. The patient's tolerance of                            previous anesthesia was also reviewed. The risks                            and benefits of the procedure and the sedation                            options and risks were discussed with the patient.                            All questions were answered, and informed consent                            was obtained. Prior Anticoagulants: The patient has                            taken no previous anticoagulant or antiplatelet                            agents. ASA Grade Assessment: III - A patient with                            severe systemic disease. After reviewing the risks  and benefits, the patient was deemed in                            satisfactory condition to undergo the procedure.                           After obtaining informed consent, the endoscope was                            passed under direct vision. Throughout the                            procedure, the patient's blood pressure, pulse, and                            oxygen  saturations were monitored continuously. The                            [id number]-[id number] ([id number]) scope was introduced through the                            and advanced to the second part of duodenum. The                            upper GI endoscopy was accomplished without                            difficulty. The patient tolerated the procedure                            well. Scope In: 2:56:14 PM Scope Out: 3:03:15 PM Total Procedure Duration: 0 hours 7 minutes 1 second  Findings:      A non-obstructing Schatzki ring was found at the gastroesophageal       junction. Small hiatal hernia present.      Somewhat "mottled" " appearing gastric ee an ulcer infiltrating process.       Pylorus. Examination of the bulb and second portion revealed a bulky       exophytic appearing, friable neoplastic process. Compromising,but not       obstructing, the lumen. Multiple biopsies taken. Impression:               - Non-obstructing Schatzki ring.                           - Hiatal hernia. Neoplastic appearing process in                            the duodenum as described above?"status post biopsy. Moderate Sedation:      Moderate (conscious) sedation was personally administered by an       anesthesia professional. The following parameters were monitored: oxygen       saturation, heart rate, blood pressure, respiratory rate, EKG, adequacy       of pulmonary ventilation, and response to care. Total physician       intraservice time was 12 minutes. Recommendation:           -  Return patient to hospital ward for ongoing care.                            Follow-up on pathology. Low residue diet. Procedure Code(s):        --- Professional ---                           [phone], Esophagogastroduodenoscopy, flexible,                            transoral; diagnostic, including collection of                            specimen(s) by brushing or washing, when performed                            (separate  procedure) Diagnosis Code(s):        --- Professional ---                           K22.2, Esophageal obstruction                           K31.89, Other diseases of stomach and duodenum                           R93.3, Abnormal findings on diagnostic imaging of                            other parts of digestive tract CPT copyright 2017 [employer]. All rights reserved. The codes documented in this report are preliminary and upon coder review may  be revised to meet current compliance requirements. Cristopher Estimable. Rourk, MD Norvel Richards, MD 07/24/2017 3:12:46 PM This report has been signed electronically. Number of Addenda: 0

## 2017-07-24 NOTE — Evaluation (Signed)
Physical Therapy Evaluation Patient Details Name: Casey Lopez MRN: [id number] DOB: Mar 22, 1940 Today's Date: 07/24/2017   History of Present Illness   Casey Lopez is a 77 y.o. female with medical history significant of breast cancer, depression, goiter, hypothyroidism, hypertension, sickle cell trait who is coming to the emergency department with complaints of progressively worse weakness for about 2 weeks since she was diagnosed with a sinus infection and a UTI at the end of last month.  She still complains of headache and stated that she has had melena for the past few days associated with fatigue, postural dizziness and weakness.  She denies using OTC or prescribed NSAIDs.  Denies recent fever, but complains of chills.  No sore throat, chest pain, dyspnea, palpitations, diaphoresis, PND, orthopnea or pitting edema lower extremities.  No abdominal pain, nausea, emesis, diarrhea or constipation.  Denies dysuria, frequency or hematuria.  No heat or cold intolerance.  No polyuria, polydipsia, polyphagia or blurred vision.    Clinical Impression  Patient functioning at baseline for functional mobility and gait, other than c/o pain in left jaw, able to ambulate in hallways without loss of balance.  Plan:  Patient discharged from physical therapy to care of nursing for ambulation daily as tolerated for length of stay.     Follow Up Recommendations No PT follow up    Equipment Recommendations  None recommended by PT    Recommendations for Other Services       Precautions / Restrictions Precautions Precautions: None Restrictions Weight Bearing Restrictions: No      Mobility  Bed Mobility Overal bed mobility: Independent                Transfers Overall transfer level: Independent                  Ambulation/Gait Ambulation/Gait assistance: Modified independent (Device/Increase time) Gait Distance (Feet): 150 Feet Assistive device: None Gait  Pattern/deviations: WFL(Within Functional Limits) Gait velocity: decreased   General Gait Details: grossly WFL except slightly slower than normal, no loss of balance on level, inclined, or declined surfaces  Stairs            Wheelchair Mobility    Modified Rankin (Stroke Patients Only)       Balance Overall balance assessment: No apparent balance deficits (not formally assessed)                                           Pertinent Vitals/Pain Pain Assessment: 0-10 Pain Score: 9  Pain Location: left jaw, deep Pain Descriptors / Indicators: Discomfort;Aching Pain Intervention(s): Limited activity within patient's tolerance;Monitored during session    Home Living Family/patient expects to be discharged to:: Private residence Living Arrangements: Alone Available Help at Discharge: Family(sons) Type of Home: House Home Access: Level entry     Home Layout: One level Home Equipment: None      Prior Function Level of Independence: Independent         Comments: community ambulator, drives, participates in community activities     Hand Dominance        Extremity/Trunk Assessment   Upper Extremity Assessment Upper Extremity Assessment: Overall WFL for tasks assessed    Lower Extremity Assessment Lower Extremity Assessment: Overall WFL for tasks assessed    Cervical / Trunk Assessment Cervical / Trunk Assessment: Normal  Communication   Communication: No difficulties  Cognition Arousal/Alertness:  Awake/alert Behavior During Therapy: WFL for tasks assessed/performed Overall Cognitive Status: Within Functional Limits for tasks assessed                                        General Comments      Exercises     Assessment/Plan    PT Assessment Patent does not need any further PT services  PT Problem List         PT Treatment Interventions      PT Goals (Current goals can be found in the Care Plan section)   Acute Rehab PT Goals Patient Stated Goal: return home PT Goal Formulation: With patient Time For Goal Achievement: 16-Aug-2017 Potential to Achieve Goals: Good    Frequency     Barriers to discharge        Co-evaluation               AM-PAC PT "6 Clicks" Daily Activity  Outcome Measure Difficulty turning over in bed (including adjusting bedclothes, sheets and blankets)?: None Difficulty moving from lying on back to sitting on the side of the bed? : None Difficulty sitting down on and standing up from a chair with arms (e.g., wheelchair, bedside commode, etc,.)?: None Help needed moving to and from a bed to chair (including a wheelchair)?: None Help needed walking in hospital room?: None Help needed climbing 3-5 steps with a railing? : None 6 Click Score: 24    End of Session   Activity Tolerance: Patient tolerated treatment well Patient left: in chair;with call bell/phone within reach Nurse Communication: Mobility status PT Visit Diagnosis: Unsteadiness on feet (R26.81);Other abnormalities of gait and mobility (R26.89);Muscle weakness (generalized) (M62.81)    Time: 9432-0037 PT Time Calculation (min) (ACUTE ONLY): 17 min   Charges:   PT Evaluation $PT Eval Low Complexity: 1 Low PT Treatments $Therapeutic Activity: 8-22 mins   PT G Codes:        12:09 PM, 08/16/17 Lonell Grandchild, MPT Physical Therapist with [facility] [phone] office [phone] mobile phone

## 2017-07-24 NOTE — Transfer of Care (Signed)
Immediate Anesthesia Transfer of Care Note  Patient: Casey Lopez  Procedure(s) Performed: ESOPHAGOGASTRODUODENOSCOPY (EGD) WITH PROPOFOL (N/A ) BIOPSY  Patient Location: PACU  Anesthesia Type:General  Level of Consciousness: awake, alert  and patient cooperative  Airway & Oxygen Therapy: Patient Spontanous Breathing  Post-op Assessment: Report given to RN and Post -op Vital signs reviewed and stable  Post vital signs: Reviewed and stable  Last Vitals:  Vitals Value Taken Time  BP    Temp    Pulse 102 07/24/2017  3:10 PM  Resp    SpO2 97 % 07/24/2017  3:10 PM  Vitals shown include unvalidated device data.  Last Pain:  Vitals:   07/24/17 1405  TempSrc: Oral  PainSc: 0-No pain      Patients Stated Pain Goal: 5 (24/81/85 9093)  Complications: No apparent anesthesia complications

## 2017-07-24 NOTE — Anesthesia Preprocedure Evaluation (Signed)
Anesthesia Evaluation  Patient identified by MRN, date of birth, ID band Patient awake    Reviewed: Allergy & Precautions, NPO status , Patient's Chart, lab work & pertinent test results  Airway Mallampati: III  TM Distance: >3 FB Neck ROM: Limited   Comment: Scan states larynx normal, Trachea appears midline on exam  Voice sounds like having sinus issues  Mass found on scan today Dental  (+) Poor Dentition   Pulmonary neg pulmonary ROS,    Pulmonary exam normal        Cardiovascular Exercise Tolerance: Good hypertension, Pt. on medications and Pt. on home beta blockers negative cardio ROS Normal cardiovascular examI  H/o Tachycardia in past on inderal    Neuro/Psych Depression negative neurological ROS  negative psych ROS   GI/Hepatic negative GI ROS, Neg liver ROS,   Endo/Other  negative endocrine ROSHypothyroidism   Renal/GU negative Renal ROS  negative genitourinary   Musculoskeletal negative musculoskeletal ROS (+)   Abdominal   Peds negative pediatric ROS (+)  Hematology negative hematology ROS (+) anemia ,   Anesthesia Other Findings   Reproductive/Obstetrics negative OB ROS                             Anesthesia Physical Anesthesia Plan  ASA: III  Anesthesia Plan: General   Post-op Pain Management:    Induction: Intravenous  PONV Risk Score and Plan:   Airway Management Planned: Nasal Cannula and Simple Face Mask  Additional Equipment:   Intra-op Plan:   Post-operative Plan:   Informed Consent: I have reviewed the patients History and Physical, chart, labs and discussed the procedure including the risks, benefits and alternatives for the proposed anesthesia with the patient or authorized representative who has indicated his/her understanding and acceptance.   Dental advisory given  Plan Discussed with: CRNA  Anesthesia Plan Comments:         Anesthesia  Quick Evaluation

## 2017-07-24 NOTE — Anesthesia Procedure Notes (Signed)
Procedure Name: MAC Date/Time: 07/24/2017 2:48 PM Performed by: Vista Deck, CRNA Pre-anesthesia Checklist: Patient identified, Emergency Drugs available, Suction available, Timeout performed and Patient being monitored Patient Re-evaluated:Patient Re-evaluated prior to induction Oxygen Delivery Method: Nasal Cannula

## 2017-07-24 NOTE — Anesthesia Postprocedure Evaluation (Signed)
Anesthesia Post Note  Patient: Casey Lopez  Procedure(s) Performed: ESOPHAGOGASTRODUODENOSCOPY (EGD) WITH PROPOFOL (N/A ) BIOPSY  Patient location during evaluation: PACU Anesthesia Type: General Level of consciousness: awake and alert and patient cooperative Pain management: satisfactory to patient Vital Signs Assessment: post-procedure vital signs reviewed and stable Respiratory status: spontaneous breathing Cardiovascular status: stable Postop Assessment: no apparent nausea or vomiting Anesthetic complications: no     Last Vitals:  Vitals:   07/24/17 1530 07/24/17 1541  BP: (!) 117/59 121/63  Pulse: (!) 110 (!) 104  Resp: 19 16  Temp:  37.1 C  SpO2: 95% 94%    Last Pain:  Vitals:   07/24/17 1541  TempSrc:   PainSc: 0-No pain                 Latressa Harries

## 2017-07-24 NOTE — Progress Notes (Signed)
PROGRESS NOTE  Casey Lopez JEH:[id number] DOB: 1940-06-14 DOA: 07/22/2017 PCP: [name], MD  Brief History:  77 year old female with history of left-sided breast cancer in remission, hypertension, depression, sickle cell trait presenting with 1 month history of generalized weakness and melanotic stools.  The patient has also had intermittent epistaxis and sinus drainage and pressure.  The patient has also been complaining of intermittent right-sided greater than left-sided pharyngeal pain with swallowing and neck movement.  Upon presentation, CT of the chest revealed a 4.5 x 5.2 pleural-based left upper lobe mass with innumerable bilateral pulmonary nodules.  There was also segmental bowel wall thickening and luminal dilatation involving the  duodenum and jejunum.  CT of the maxillofacial area showed posterior nasopharyngeal soft tissue fullness extending into the posterior aspect of the right nasal cavity.  There was also a small expansile lesion with endosteal scalloping in the right frontal sinus. The patient was noted to have hemoglobin of 7.7 at the time of admission.  Her baseline hemoglobin is approximately 11.  The patient states that she takes 1 Aleve daily for the past month.  GI was consulted to assist with management.  Interventional radiology was consulted for possible biopsy of her lung mass.  Assessment/Plan: Acute blood loss anemia -Secondary to upper GI bleed -Transfuse 2 units PRBC -Hgb stable since transfusion  Melanotic stool -GI consult appreciated-->possible EGD -Suspect this may be NSAID induced -Continue PPI IV -clear liquid diet for now pending GI workup  Left Lung Mass/Lung Nodules -consulted IR for biopsy--will not pursue if EGD with SB biopsy successful -pt with 40 pk year hx of tobacco and hx of breast cancer -noted on CT chest  SB wall thickening -GI consult appreciated -08/12/2014 colonoscopy diverticulosis, polyps removed - 07/22/2017  CT abdomen segmental bowel wall thickening and luminal dilatation involving the duodenum, jejunum, and distal small bowel  Pharyngeal/neck pain -order CT neck soft tissues  Thyroid nodule/cyst -Incidental finding noted on CT -Check thyroid functions--normal TSH, and Free T4 -Outpatient endocrine follow-up  Generalized weakness -Multifactorial including possible UTI, metastatic cancer, blood loss anemia, and deconditioning -Check iron studies--iron saturation 22% -Check serum B12--530 -UA --negative for pyuria -Check folic VZCH--88.5 -PT evaluation  Essential hypertension -Holding lisinopril/HCTZ secondary to soft blood pressure initially -Holding propranolol secondary to soft blood pressure initially -Monitor clinically  Sinus congestion/pain -CT maxillofacial--posterior nasopharyngeal soft tissue fullness extending into the posterior aspect of the right nasal cavity -Expansile lesion with endosteal scalloping in the right frontal sinus with nonaggressive features -Patient will need outpatient ENT follow-up   Disposition Plan:   Home in 1-2 days  Family Communication:   Son updated at bedside 7/17  Consultants: IR, GI   Code Status:  FULL   DVT Prophylaxis:  SCD   Procedures: As Listed in Progress Note Above  Antibiotics: None      Subjective: Patient continues to complain of right-sided pharyngeal/neck pain.  She has occasional tenderness with swallowing and neck movement.  She denies any fevers, chills, headache, chest pain, shortness breath, vomiting, diarrhea, abdominal pain.  There is no dysuria hematuria.  No rashes.  Objective: Vitals:   07/23/17 1401 07/23/17 2101 07/23/17 2211 07/24/17 0603  BP: (!) 115/45  (!) 108/51 (!) 117/59  Pulse: 80  84 94  Resp: 19  17 17   Temp: 98.4 F (36.9 C)  98.3 F (36.8 C) 98.1 F (36.7 C)  TempSrc: Oral  Oral Oral  SpO2: 96% 95%  95% 93%  Weight:      Height:        Intake/Output Summary (Last 24  hours) at 07/24/2017 0826 Last data filed at 07/24/2017 5809 Gross per 24 hour  Intake 877.5 ml  Output 300 ml  Net 577.5 ml   Weight change:  Exam:   General:  Pt is alert, follows commands appropriately, not in acute distress  HEENT: No icterus, No thrush, shotty cervical lymphadenopathy, [place]/AT  Cardiovascular: RRR, S1/S2, no rubs, no gallops  Respiratory: Fine bibasilar rales.  No wheezing.  Good air movement.  Abdomen: Soft/+BS, non tender, non distended, no guarding  Extremities: No edema, No lymphangitis, No petechiae, No rashes, no synovitis   Data Reviewed: I have personally reviewed following labs and imaging studies Basic Metabolic Panel: Recent Labs  Lab 07/22/17 2153 07/23/17 1004 07/24/17 0639  NA 139 140 139  K 4.3 4.3 4.0  CL 103 106 104  CO2 28 27 29   GLUCOSE 92 108* 110*  BUN 22 15 12   CREATININE 1.16* 1.00 0.81  CALCIUM 8.4* 8.2* 8.3*   Liver Function Tests: Recent Labs  Lab 07/22/17 2153  AST 17  ALT 13  ALKPHOS 58  BILITOT 0.4  PROT 5.8*  ALBUMIN 2.5*   No results for input(s): LIPASE, AMYLASE in the last 168 hours. No results for input(s): AMMONIA in the last 168 hours. Coagulation Profile: No results for input(s): INR, PROTIME in the last 168 hours. CBC: Recent Labs  Lab 07/22/17 2153 07/23/17 1004 07/23/17 1448 07/24/17 0639  WBC 13.3* 14.5*  --  14.0*  NEUTROABS 8.3* 10.9*  --  10.6*  HGB 7.7* 10.6* 10.2* 10.2*  HCT 23.9* 32.2* 31.0* 31.7*  MCV 90.5 91.7  --  92.2  PLT 386 314  --  278   Cardiac Enzymes: No results for input(s): CKTOTAL, CKMB, CKMBINDEX, TROPONINI in the last 168 hours. BNP: Invalid input(s): POCBNP CBG: No results for input(s): GLUCAP in the last 168 hours. HbA1C: No results for input(s): HGBA1C in the last 72 hours. Urine analysis:    Component Value Date/Time   COLORURINE YELLOW 07/23/2017 1430   APPEARANCEUR CLEAR 07/23/2017 1430   LABSPEC 1.018 07/23/2017 1430   PHURINE 5.0 07/23/2017 1430     GLUCOSEU NEGATIVE 07/23/2017 1430   HGBUR NEGATIVE 07/23/2017 1430   BILIRUBINUR NEGATIVE 07/23/2017 1430   KETONESUR 5 (A) 07/23/2017 1430   PROTEINUR NEGATIVE 07/23/2017 1430   NITRITE NEGATIVE 07/23/2017 1430   LEUKOCYTESUR TRACE (A) 07/23/2017 1430   Sepsis Labs: @LABRCNTIP (procalcitonin:4,lacticidven:4) )No results found for this or any previous visit (from the past 240 hour(s)).   Scheduled Meds: . [START ON 07/26/2017] pantoprazole  40 mg Intravenous Q12H   Continuous Infusions: . pantoprozole (PROTONIX) infusion 8 mg/hr (07/23/17 2209)    Procedures/Studies: Dg Chest 2 View  Result Date: 07/22/2017 CLINICAL DATA:  Weakness. Sinus infection. History of breast cancer. EXAM: CHEST - 2 VIEW COMPARISON:  Chest radiograph April 08, 2007 FINDINGS: Homogeneously dense 4.5 cm LEFT upper lobe mass, possible pleural based. No pleural effusion. Mild bronchitic changes. Cardiomediastinal silhouette is normal. Calcified aortic arch. Surgical clips LEFT chest wall. Osseous structures are nonacute. IMPRESSION: 1. New LEFT upper lobe mass, potentially pleural based. Recommend CT chest with contrast. 2. Mild bronchitic changes. 3.  Aortic Atherosclerosis (ICD10-I70.0). 4. Acute findings discussed with and reconfirmed by Dr.JULIE HAVILAND on 07/22/2017 at 9:58 pm. Electronically Signed   By: Elon Alas M.D.   On: 07/22/2017 21:59   Ct Chest W Contrast  Result Date: 07/22/2017 CLINICAL DATA:  77 year old female with rectal bleeding. EXAM: CT CHEST, ABDOMEN, AND PELVIS WITH CONTRAST TECHNIQUE: Multidetector CT imaging of the chest, abdomen and pelvis was performed following the standard protocol during bolus administration of intravenous contrast. CONTRAST:  68mL OMNIPAQUE IOHEXOL 300 MG/ML  SOLN COMPARISON:  None. FINDINGS: CT CHEST FINDINGS Cardiovascular: There is no cardiomegaly or pericardial effusion. There is coronary vascular calcification primarily involving the LAD. Mild atherosclerotic  calcification of the aortic arch. The visualized origins of the great vessels of the aortic arch are patent. The central pulmonary arteries are grossly unremarkable. Mediastinum/Nodes: There is no hilar or mediastinal adenopathy. The esophagus is grossly unremarkable. There is a 2.5 x 3.9 cm right thyroid cystic or necrotic nodule with multiple enhancing intracystic nodules. Further evaluation with thyroid ultrasound recommended. Additional smaller nodules noted in the left thyroid lobe. Lungs/Pleura: There is a 4.5 x 5.2 cm pleural based mass in the left upper lobe. There are multiple small bilateral nodular densities which may represent metastatic disease, or an atypical infection. There is no pleural effusion or pneumothorax. The central airways are patent. Musculoskeletal: There is no axillary adenopathy. Surgical clips of the left axilla and left-sided mastectomy noted. There is osteopenia with degenerative changes of the spine. There is diffuse heterogeneity of the spine which may be secondary to osteopenia or represent small metastatic disease. CT ABDOMEN PELVIS FINDINGS There is no intra-abdominal free air or free fluid. Hepatobiliary: Subcentimeter hypodense lesion in the anterior liver (series 2, image 46) is too small to characterize. No intrahepatic biliary ductal dilatation. Cholecystectomy. Pancreas: Unremarkable. No pancreatic ductal dilatation or surrounding inflammatory changes. Spleen: Normal in size without focal abnormality. Adrenals/Urinary Tract: The adrenal glands are unremarkable. Multiple bilateral renal cysts as well as smaller hypodense lesions which are too small to characterize. There is no hydronephrosis on either side. There is symmetric enhancement and excretion of contrast by both kidneys. The visualized ureters and urinary bladder appear unremarkable. Stomach/Bowel: There is thickening of the wall of the duodenum and jejunal loops in the left upper abdomen with luminal dilatation  highly suspicious for an infiltrative/neoplastic process such as lymphoma. There is thickening and luminal dilatation of a segment of bowel in the pelvis also concerning for lymphoma. Mural hemorrhage within the bowel wall is a less likely possibility if the patient is on anticoagulation. There is no bowel obstruction. There is colonic diverticulosis without active inflammatory changes. Appendectomy. Vascular/Lymphatic: Moderate aortoiliac atherosclerotic disease. No portal venous gas. There is no adenopathy. Reproductive: Hysterectomy. There is a 16 mm right ovarian cyst. The left ovary is not visualized with certainty Other: None Musculoskeletal: Osteopenia with degenerative changes of the spine no acute osseous pathology. IMPRESSION: 1. Left upper lobe pleural based mass. 2. Innumerable small pulmonary nodules may represent metastatic disease versus atypical infection. Clinical correlation is recommended. 3. Segment of bowel wall thickening and luminal dilatation involving the duodenum, jejunum, as well as distal small bowel suspicious for bowel lymphoma. PET-CT is recommended for further evaluation of pulmonary and bowel findings. 4. Colonic diverticulosis.  No bowel obstruction. 5. Osteopenia. Heterogeneity of the spine may be related to osteopenia although small metastatic lesions are not excluded. 6. Left mastectomy. 7.  Aortic Atherosclerosis (ICD10-I70.0). Electronically Signed   By: Anner Crete M.D.   On: 07/22/2017 23:44   Ct Abdomen Pelvis W Contrast  Result Date: 07/22/2017 CLINICAL DATA:  77 year old female with rectal bleeding. EXAM: CT CHEST, ABDOMEN, AND PELVIS WITH CONTRAST TECHNIQUE: Multidetector CT imaging of the  chest, abdomen and pelvis was performed following the standard protocol during bolus administration of intravenous contrast. CONTRAST:  60mL OMNIPAQUE IOHEXOL 300 MG/ML  SOLN COMPARISON:  None. FINDINGS: CT CHEST FINDINGS Cardiovascular: There is no cardiomegaly or pericardial  effusion. There is coronary vascular calcification primarily involving the LAD. Mild atherosclerotic calcification of the aortic arch. The visualized origins of the great vessels of the aortic arch are patent. The central pulmonary arteries are grossly unremarkable. Mediastinum/Nodes: There is no hilar or mediastinal adenopathy. The esophagus is grossly unremarkable. There is a 2.5 x 3.9 cm right thyroid cystic or necrotic nodule with multiple enhancing intracystic nodules. Further evaluation with thyroid ultrasound recommended. Additional smaller nodules noted in the left thyroid lobe. Lungs/Pleura: There is a 4.5 x 5.2 cm pleural based mass in the left upper lobe. There are multiple small bilateral nodular densities which may represent metastatic disease, or an atypical infection. There is no pleural effusion or pneumothorax. The central airways are patent. Musculoskeletal: There is no axillary adenopathy. Surgical clips of the left axilla and left-sided mastectomy noted. There is osteopenia with degenerative changes of the spine. There is diffuse heterogeneity of the spine which may be secondary to osteopenia or represent small metastatic disease. CT ABDOMEN PELVIS FINDINGS There is no intra-abdominal free air or free fluid. Hepatobiliary: Subcentimeter hypodense lesion in the anterior liver (series 2, image 46) is too small to characterize. No intrahepatic biliary ductal dilatation. Cholecystectomy. Pancreas: Unremarkable. No pancreatic ductal dilatation or surrounding inflammatory changes. Spleen: Normal in size without focal abnormality. Adrenals/Urinary Tract: The adrenal glands are unremarkable. Multiple bilateral renal cysts as well as smaller hypodense lesions which are too small to characterize. There is no hydronephrosis on either side. There is symmetric enhancement and excretion of contrast by both kidneys. The visualized ureters and urinary bladder appear unremarkable. Stomach/Bowel: There is  thickening of the wall of the duodenum and jejunal loops in the left upper abdomen with luminal dilatation highly suspicious for an infiltrative/neoplastic process such as lymphoma. There is thickening and luminal dilatation of a segment of bowel in the pelvis also concerning for lymphoma. Mural hemorrhage within the bowel wall is a less likely possibility if the patient is on anticoagulation. There is no bowel obstruction. There is colonic diverticulosis without active inflammatory changes. Appendectomy. Vascular/Lymphatic: Moderate aortoiliac atherosclerotic disease. No portal venous gas. There is no adenopathy. Reproductive: Hysterectomy. There is a 16 mm right ovarian cyst. The left ovary is not visualized with certainty Other: None Musculoskeletal: Osteopenia with degenerative changes of the spine no acute osseous pathology. IMPRESSION: 1. Left upper lobe pleural based mass. 2. Innumerable small pulmonary nodules may represent metastatic disease versus atypical infection. Clinical correlation is recommended. 3. Segment of bowel wall thickening and luminal dilatation involving the duodenum, jejunum, as well as distal small bowel suspicious for bowel lymphoma. PET-CT is recommended for further evaluation of pulmonary and bowel findings. 4. Colonic diverticulosis.  No bowel obstruction. 5. Osteopenia. Heterogeneity of the spine may be related to osteopenia although small metastatic lesions are not excluded. 6. Left mastectomy. 7.  Aortic Atherosclerosis (ICD10-I70.0). Electronically Signed   By: Anner Crete M.D.   On: 07/22/2017 23:44   Ct Maxillofacial Wo Contrast  Result Date: 07/22/2017 CLINICAL DATA:  Pt c/o sinus pressure with pain and overall weakness since being diagnosed with sinus infection and uti at the end of June. pt complained of rt side swelling just below mandible EXAM: CT MAXILLOFACIAL WITHOUT CONTRAST TECHNIQUE: Multidetector CT imaging of the maxillofacial structures was  performed.  Multiplanar CT image reconstructions were also generated. COMPARISON:  None. 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 Multiple teeth are absent. There is a cavity of the remaining right maxillary molar. 1.0 by 0.6 by 0.8 cm lesion along the upper margin of the right frontal sinus has some endosteal scalloping and some faint spiculations of bone, but generally nonaggressive characteristics. There is some bony demineralization.  Cervical spondylosis noted. Orbits: Unremarkable Sinuses: Mild chronic right sphenoid sinusitis. Soft tissues: Posterior nasopharyngeal soft tissue fullness extending somewhat into the right nasal cavity. Polypoid lesion or mass not excluded, and inspection is recommended. Left level II lymph node 1.3 cm in short axis on image 81/2. Right level IIb lymph node 1.0 cm in short axis. Suspected leftward deviation of the glottis, although there is extensive motion artifact making this difficult to be certain of. Limited intracranial: Unremarkable IMPRESSION: 1. Posterior nasopharyngeal soft tissue fullness extending somewhat into the posterior aspect of the right nasal cavity. Malignancy is not excluded and inspection of the posterior nasopharynx is recommended. 2. Bilateral mild level 2 adenopathy, also raising some concern for potential malignancy. 3. Small expansile lesion with endosteal scalloping along the upper margin of the right frontal sinus. This has mostly nonaggressive characteristics, suggesting hemangioma, cholesterol granuloma, or similar lesion. 4. Bilateral torus mandibularis. 5. Cavity of the remaining right maxillary molar. 6. Mild chronic right sphenoid sinusitis. 7. Bony demineralization. Electronically Signed   By: Van Clines M.D.   On: 07/22/2017 21:53    Orson Eva, DO  [facility] Pager [phone]  If 7PM-7AM, please contact night-coverage [email or web address] Password [email or web address] 07/24/2017, 8:26 AM   LOS: 2 days

## 2017-07-24 NOTE — Progress Notes (Signed)
Patient seen in short stay. She is stable. Hemoglobin this morning 10.2.  Patient denies dysphagia. Agree with need for EGD with biopsy is feasible. The risks, benefits, limitations, alternatives and imponderables have been reviewed with the patient. Potential for esophageal dilation, biopsy, etc. have also been reviewed.  Questions have been answered. All parties agreeable.  Further recommendations to follow.

## 2017-07-25 DIAGNOSIS — J392 Other diseases of pharynx: Secondary | ICD-10-CM

## 2017-07-25 DIAGNOSIS — F329 Major depressive disorder, single episode, unspecified: Secondary | ICD-10-CM

## 2017-07-25 DIAGNOSIS — I10 Essential (primary) hypertension: Secondary | ICD-10-CM

## 2017-07-25 LAB — URINE CULTURE

## 2017-07-25 LAB — BASIC METABOLIC PANEL
Anion gap: 7 (ref 5–15)
BUN: 14 mg/dL (ref 8–23)
CHLORIDE: 105 mmol/L (ref 98–111)
CO2: 30 mmol/L (ref 22–32)
Calcium: 8.5 mg/dL — ABNORMAL LOW (ref 8.9–10.3)
Creatinine, Ser: 0.82 mg/dL (ref 0.44–1.00)
Glucose, Bld: 112 mg/dL — ABNORMAL HIGH (ref 70–99)
POTASSIUM: 4 mmol/L (ref 3.5–5.1)
SODIUM: 142 mmol/L (ref 135–145)

## 2017-07-25 LAB — CBC
HCT: 31.6 % — ABNORMAL LOW (ref 36.0–46.0)
Hemoglobin: 10.2 g/dL — ABNORMAL LOW (ref 12.0–15.0)
MCH: 29.7 pg (ref 26.0–34.0)
MCHC: 32.3 g/dL (ref 30.0–36.0)
MCV: 92.1 fL (ref 78.0–100.0)
Platelets: 305 10*3/uL (ref 150–400)
RBC: 3.43 MIL/uL — AB (ref 3.87–5.11)
RDW: 14.9 % (ref 11.5–15.5)
WBC: 15.8 10*3/uL — AB (ref 4.0–10.5)

## 2017-07-25 MED ORDER — PANTOPRAZOLE SODIUM 40 MG PO TBEC: 40.0000 mg | DELAYED_RELEASE_TABLET | Freq: Every day | ORAL | 0 refills | Status: AC

## 2017-07-25 MED ORDER — HYDROCODONE-ACETAMINOPHEN 5-325 MG PO TABS: 1.0000 | ORAL_TABLET | ORAL | 0 refills | Status: DC | PRN

## 2017-07-25 MED ORDER — HYDROCODONE-ACETAMINOPHEN 5-325 MG PO TABS: 1.0000 | ORAL_TABLET | ORAL | 0 refills | Status: AC | PRN

## 2017-07-25 MED ORDER — PANTOPRAZOLE SODIUM 40 MG PO TBEC
40.0000 mg | DELAYED_RELEASE_TABLET | Freq: Every day | ORAL | Status: DC
  Administered 2017-07-25: 40 mg via ORAL
  Filled 2017-07-25: qty 1

## 2017-07-25 MED ORDER — METOPROLOL TARTRATE 25 MG PO TABS
12.5000 mg | ORAL_TABLET | Freq: Two times a day (BID) | ORAL | Status: DC
  Administered 2017-07-25: 12.5 mg via ORAL
  Filled 2017-07-25: qty 1

## 2017-07-25 NOTE — Progress Notes (Addendum)
Subjective: The patient feels well this morning.  Denies abdominal pain, nausea vomiting, overt GI bleed.  She requested I review her EGD results with her which I did at length.  We discussed the need for oncology evaluation to help with decision-making once biopsies are back.  She expressed appreciation for discussing this with her.  No GI complaints at this time.  Objective: Vital signs in last 24 hours: Temp:  [98 F (36.7 C)-99.2 F (37.3 C)] 98.5 F (36.9 C) (07/19 0535) Pulse Rate:  [92-110] 102 (07/19 0535) Resp:  [16-22] 22 (07/19 0535) BP: (117-142)/(57-63) 125/58 (07/19 0535) SpO2:  [94 %-100 %] 96 % (07/19 0535) Weight:  [152 lb (68.9 kg)] 152 lb (68.9 kg) (07/18 1405) Last BM Date: 07/22/17 General:   Alert and oriented, pleasant Head:  Normocephalic and atraumatic. Eyes:  No icterus, sclera clear. Conjuctiva pink.  Heart:  S1, S2 present, no murmurs noted.  Lungs: Clear to auscultation bilaterally, without wheezing, rales, or rhonchi.  Abdomen:  Bowel sounds present, soft, non-tender, non-distended. No HSM or hernias noted. No rebound or guarding. No masses appreciated  Msk:  Symmetrical without gross deformities. Pulses:  Normal bilateral DP pulses noted. Extremities:  Without clubbing or edema. Neurologic:  Alert and  oriented x4;  grossly normal neurologically. Psych:  Alert and cooperative. Normal mood and affect.  Intake/Output from previous day: 07/18 0701 - 07/19 0700 In: 1008.3 [P.O.:340; I.V.:668.3] Out: -  Intake/Output this shift: No intake/output data recorded.  Lab Results: Recent Labs    07/23/17 1004 07/23/17 1448 07/24/17 0639 07/25/17 0526  WBC 14.5*  --  14.0* 15.8*  HGB 10.6* 10.2* 10.2* 10.2*  HCT 32.2* 31.0* 31.7* 31.6*  PLT 314  --  278 305   BMET Recent Labs    07/23/17 1004 07/24/17 0639 07/25/17 0526  NA 140 139 142  K 4.3 4.0 4.0  CL 106 104 105  CO2 _0 GLUCOSE 108* 110* 112*  BUN _1 CREATININE 1.00  0.81 0.82  CALCIUM 8.2* 8.3* 8.5*   LFT Recent Labs    07/22/17 2153  PROT 5.8*  ALBUMIN 2.5*  AST 17  ALT 13  ALKPHOS 58  BILITOT 0.4   PT/INR No results for input(s): LABPROT, INR in the last 72 hours. Hepatitis Panel No results for input(s): HEPBSAG, HCVAB, HEPAIGM, HEPBIGM in the last 72 hours.   Studies/Results: Ct Soft Tissue Neck W Contrast  Result Date: 07/24/2017 CLINICAL DATA:  Sinus congestion with RIGHT-sided throat pain. EXAM: CT NECK WITH CONTRAST TECHNIQUE: Multidetector CT imaging of the neck was performed using the standard protocol following the bolus administration of intravenous contrast. CONTRAST:  29m OMNIPAQUE IOHEXOL 300 MG/ML  SOLN COMPARISON:  CT maxillofacial 07/22/2017. CT chest, abdomen and pelvis 07/22/2017. thyroid sonography 08/28/2007. FINDINGS: Pharynx and larynx: Bulky nasopharyngeal mass, filling the nasopharynx, displacing the soft palate and uvula anteriorly, approximate 24 x 28 x 37 mm. No eustachian tube dysfunction. No oropharyngeal or hypopharyngeal masses. Normal larynx. Salivary glands: No inflammation, mass, or stone. Thyroid: Bulky mass involving much of the RIGHT lobe of the thyroid, with nodular solid, cystic, and peripherally calcified components, measuring 28 x 39 x 52 mm. This mass was present in 2009, similar size, and tissue sampling was recommended in 2009, but I have no indication of whether this was actually performed. Based on the sonographic appearance at that time as well as the CT appearance today, those recommendations remain pertinent. Lymph nodes: Malignant-appearing adenopathy  in the neck. RIGHT retropharyngeal node of root VA, greater than 2 cm long axis, could represent adjacent spread of tumor from the nasopharyngeal mass. BILATERAL level 2 lymph nodes, greater on the LEFT, 15 mm short axis no definite level 3, level 4, or level 5 nodal enlargement. Vascular: Patent. Limited intracranial: Not assessed. Visualized orbits:  Grossly negative, partially visualized. Mastoids and visualized paranasal sinuses: Layering fluid in the RIGHT sphenoid sinus. Frontal sinus not visualized. No mastoid effusions. Skeleton: Spondylosis.  No osseous destructive lesion. Upper chest: LEFT upper lobe mass, and numerous pulmonary nodules; see report of chest CT 07/22/2017. Other: None. 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 Enlarged RIGHT retropharyngeal lymph node could represent local spread, and contribute to the patient's RIGHT-sided symptoms. BILATERAL level 2 adenopathy, also malignant-appearing. Large RIGHT thyroid nodule described in 2009, appears similar; tissue sampling was recommended at that time although it is unclear if this occurred. Findings discussed with ordering provider at the time dictation. Electronically Signed   By: Staci Righter M.D.   On: 07/24/2017 11:40    Assessment: Unfortunate situation of a 77 year old family with history of breast CA who presented with significant sinus pain/pressure, weakness, and 28 lb weight loss in 2-3 months. She underwent imaging which found multiple masses including maxiofacial soft tissues, multiple lung nodules, wall thickening and luminal dilation of the duodenum, jejunum, and distal small bowel. Query multiple primary cancers vs metestatic disease from single primary vs recurrence of breast CA with METS. IR was consulted as well as transfer to [facility] for multidisciplinary evaluation (including ENT) for lung biopsy but has deferred for now pending GI diagnostic workup for tissue sampling.  As an aside, she had melena with a drop in hgb in the setting of small bowel imaging abnormalities and was transfused 2 u PRBC. Her hgb today is stable at 10.2. Query small bowel lesions blood loss although PUD, duodenal erosions/ulcers, esophagitis and the like remain in the differentials.  EGD completed  yesterday under propofol/MAC which found a non-obstructing Schatzki's ring at the GE junction, "mottled" gastric mucosa. Duodenal bulky exophytic appearing, friable neoplastic process which was compromising but not obstructing the lumen s/p multiple biopsies. Recommended return to hospital floor for ongoing care, low residue diet, follow-up on pathology.  Today her BMP is stable with normal kidney function and electrolytes (except hypocalcemia). CBC with stable hgb at 10.2, increasing leucocytosis at 15.8. Her anemia is normocytic and normochromic. Platelets normal. Pathology still pending.   Overall she feels well today.  We discussed her endoscopic results at length.  I answered multiple questions related to possible next steps.  I informed her that pathology results should be back in a few days and this would be the key piece to referring her to oncology for decisions on how to proceed.  She seems anxious and ready "to fight" but also resigned to having "lived 77  Years" and able to except what may come.  Plan: 1. Follow for path results 2. Will need oncology evaluation (IP vs OP) 3. Monitor for any GI bleed or further drop in hgb 4. Transfuse if/as necessary 5. We will follow peripherally   Thank you for allowing [place] to participate in the care of [place], DNP, AGNP-C Adult & Gerontological Nurse Practitioner [facility]     LOS: 3 days    07/25/2017, 8:14 AM  Attending note: Agree with above. May well  be the first the week before biopsy results come out. Dr. Laural Golden will see over the weekend as needed.

## 2017-07-25 NOTE — Care Management Important Message (Signed)
Important Message  Patient Details  Name: Casey Lopez MRN: [id number] Date of Birth: 02/19/1940   Medicare Important Message Given:  Yes    Sherald Barge, RN 07/25/2017, 10:53 AM

## 2017-07-25 NOTE — Care Management Note (Signed)
Case Management Note  Patient Details  Name: Casey Lopez MRN: [id number] Date of Birth: Nov 07, 1940  Subjective/Objective:       Admitted with [facility]. Pt is from home, ind pta. She has PCP, transportation and insurance with drug coverage. She is planning to go stay with her son and DIL after DC.              Action/Plan: DC home today with self care. MD has ordered [name] and pt declines referral at this time saying "I will call and let you know if and when I want it". CM encouraged pt to let us make referral and she could have those discussions with the [facility]. Pt does not want to do that. CM explained that pt could call us after DC as the MD had placed an order but she may end up having to go through her PCP office. Pt okay with this plan.   Expected Discharge Date:  07/25/17               Expected Discharge Plan:  Home/Self Care  In-House Referral:  NA  Discharge planning Services  CM Consult  Post Acute Care Choice:  NA Choice offered to:  NA  Status of Service:  Completed, signed off  If discussed at Long Length of Stay Meetings, dates discussed:    Additional Comments:  Sherald Barge, RN 07/25/2017, 3:17 PM

## 2017-07-25 NOTE — Discharge Summary (Signed)
Physician Discharge Summary  Casey Lopez WUJ:[id number] DOB: September 19, 1940 DOA: 07/22/2017  PCP: [name], MD  Admit date: 07/22/2017 Discharge date: 07/26/17  Admitted From: Home Disposition:  Home   Recommendations for Outpatient Follow-up:  1. Follow up with PCP in 1-2 weeks 2. Please obtain BMP/CBC in one week 3. Follow up with ENT, Dr. Redmond Baseman in 1 week 4. Med onc follow up at [facility] 07/24/2017 @1PM    Home Health: YES Equipment/Devices: HHPT  Discharge Condition: Stable CODE STATUS: FULL Diet recommendation:  Regular   Brief/Interim Summary: 77 year old female with history of left-sided breast cancer in remission, hypertension, depression, sickle cell trait presenting with 1 month history of generalized weakness and melanotic stools. The patient has also had intermittent epistaxis and sinus drainage and pressure.  The patient has also been complaining of intermittent right-sided greater than left-sided pharyngeal pain with swallowing and neck movement. Upon presentation, CT of the chest revealed a 4.5 x 5.2 pleural-based left upper lobe mass with innumerable bilateral pulmonary nodules. There was also segmental bowel wall thickening and luminal dilatation involving the duodenum and jejunum.  CT of the maxillofacial area showed posterior nasopharyngeal soft tissue fullness extending into the posterior aspect of the right nasal cavity.  There was also a small expansile lesion with endosteal scalloping in the right frontal sinus.The patient was noted to have hemoglobin of 7.7 at the time of admission. Her baseline hemoglobin is approximately 11. The patient states that she takes 1 Aleve daily for the past month. GI was consulted to assist with management. Interventional radiology was consulted for possible biopsy of her lung mass.  They felt that should be pursued only if GI work-up was unremarkable.  The patient underwent EGD with biopsy of her small bowel.  Pathology  is pending.  ENT was consulted.  The case was discussed with Dr. Redmond Baseman.  He will see the patient after discharge in the office for further work-up.     Discharge Diagnoses:  Acute blood loss anemia -Secondary to upper GI bleed -Transfused 2 units PRBC -Hgb stable since transfusion  Melanotic stool -GI consult appreciated-->EGD -07/24/2017 EGD--nonobstructive Schatzki's ring.  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 Harland German reviewed -he will see patient in followup after discharge in office  Thyroid nodule/cyst -Incidental finding noted on CT -Check thyroid functions--normal TSH, and Free T4 -Outpatient endocrine follow-up  Generalized weakness -Multifactorial including possible UTI, metastatic cancer, blood loss anemia, and deconditioning -Check iron studies--iron saturation 22% -Check serum B12--530 -UA --negative for pyuria -Check folic NFAO--13.0 -PT evaluation--HHPT  Essential hypertension -Holding lisinopril/HCTZ secondary to soft blood pressure initially -Holding propranolol secondary to soft blood pressure initially -Monitor clinically  Sinus congestion/pain -CT maxillofacial--posterior nasopharyngeal soft tissue fullness extending into the posterior aspect of the right  nasal cavity -Expansile lesion with endosteal scalloping in the right frontal sinus with nonaggressive features -outpatient ENT follow-up with Dr. Redmond Baseman  Sinus Tachycardia/PSVT -partly reflex  tachycardia from discontinuation of propranolol -start metoprolol 12.5 mg bid    Discharge Instructions   Allergies as of 07/25/2017      Reactions   Sulfa Antibiotics Swelling   Feldene [piroxicam]    Penicillins Rash   Has patient had a PCN reaction causing immediate rash, facial/tongue/throat swelling, SOB or lightheadedness with hypotension: No Has patient had a PCN reaction causing severe rash involving mucus membranes or skin necrosis: No Has patient had a PCN reaction that required hospitalization: No Has patient had a PCN reaction occurring within the last 10 years: No If all of the above answers are "NO", then may proceed with Cephalosporin use.      Medication List    STOP taking these medications   cephALEXin 500 MG capsule Commonly known as:  KEFLEX   lisinopril-hydrochlorothiazide 20-12.5 MG tablet Commonly known as:  PRINZIDE,ZESTORETIC   propranolol ER 80 MG 24 hr capsule Commonly known as:  INDERAL LA     TAKE these medications   ALPRAZolam 0.5 MG tablet Commonly known as:  XANAX Take 0.5-1 tablets (0.25-0.5 mg total) by mouth 2 (two) times daily as needed for anxiety.   cetirizine 10 MG tablet Commonly known as:  ZYRTEC Take 10 mg by mouth daily as needed for allergies. Only as needed   HYDROcodone-acetaminophen 5-325 MG tablet Commonly known as:  NORCO/VICODIN Take 1 tablet by mouth every 4 (four) hours as needed for moderate pain.   K-DUR 20 MEQ tablet Generic drug:  potassium chloride SA TAKE (1) TABLET BY MOUTH ONCE DAILY.   levothyroxine 50 MCG tablet Commonly known as:  SYNTHROID, LEVOTHROID Take 50 mcg by mouth daily.   metFORMIN 500 MG 24 hr tablet Commonly known as:  GLUCOPHAGE-XR Take 500 mg by mouth every evening.   pantoprazole 40 MG  tablet Commonly known as:  PROTONIX Take 1 tablet (40 mg total) by mouth daily.   simvastatin 40 MG tablet Commonly known as:  ZOCOR Take 40 mg by mouth at bedtime.   VITAMIN D PO Take 1 capsule by mouth daily.      Follow-up Information    Higgs, Mathis Dad, MD. Go on 07/17/2017.   Specialty:  Oncology Why:  at 1:00 p.m Contact information: [place] [address] [phone]        Melida Quitter, MD Follow up in 1 week(s).   Specialty:  Otolaryngology Contact information: [address] [place] [address] [phone]          Allergies  Allergen Reactions  . Sulfa Antibiotics Swelling  . Feldene [Piroxicam]   . Penicillins Rash    Has patient had a PCN reaction causing immediate rash, facial/tongue/throat swelling, SOB or lightheadedness with hypotension: No Has patient had a PCN reaction causing severe rash involving mucus membranes or skin necrosis: No Has patient had a PCN reaction that required hospitalization: No Has patient had a PCN reaction occurring within the last 10 years: No If all of the above answers are "NO", then may proceed with Cephalosporin use.     Consultations:  ENT--[facility]   Procedures/Studies: Dg Chest 2 View  Result Date: 07/22/2017 CLINICAL DATA:  Weakness. Sinus infection. History of breast cancer. EXAM: CHEST - 2 VIEW COMPARISON:  Chest radiograph April 08, 2007 FINDINGS: Homogeneously dense 4.5 cm LEFT upper lobe mass, possible pleural based. No pleural effusion. Mild bronchitic changes. Cardiomediastinal silhouette is normal. Calcified aortic arch. Surgical clips LEFT chest wall. Osseous structures are nonacute. IMPRESSION:  1. New LEFT upper lobe mass, potentially pleural based. Recommend CT chest with contrast. 2. Mild bronchitic changes. 3.  Aortic Atherosclerosis (ICD10-I70.0). 4. Acute findings discussed with and reconfirmed by Dr.JULIE HAVILAND on 07/22/2017 at 9:58 pm. Electronically  Signed   By: Elon Alas M.D.   On: 07/22/2017 21:59   Ct Soft Tissue Neck W Contrast  Result Date: 07/24/2017 CLINICAL DATA:  Sinus congestion with RIGHT-sided throat pain. EXAM: CT NECK WITH CONTRAST TECHNIQUE: Multidetector CT imaging of the neck was performed using the standard protocol following the bolus administration of intravenous contrast. CONTRAST:  70mL OMNIPAQUE IOHEXOL 300 MG/ML  SOLN COMPARISON:  CT maxillofacial 07/22/2017. CT chest, abdomen and pelvis 07/22/2017. thyroid sonography 08/28/2007. FINDINGS: Pharynx and larynx: Bulky nasopharyngeal mass, filling the nasopharynx, displacing the soft palate and uvula anteriorly, approximate 24 x 28 x 37 mm. No eustachian tube dysfunction. No oropharyngeal or hypopharyngeal masses. Normal larynx. Salivary glands: No inflammation, mass, or stone. Thyroid: Bulky mass involving much of the RIGHT lobe of the thyroid, with nodular solid, cystic, and peripherally calcified components, measuring 28 x 39 x 52 mm. This mass was present in 2009, similar size, and tissue sampling was recommended in 2009, but I have no indication of whether this was actually performed. Based on the sonographic appearance at that time as well as the CT appearance today, those recommendations remain pertinent. Lymph nodes: Malignant-appearing adenopathy in the neck. RIGHT retropharyngeal node of root VA, greater than 2 cm long axis, could represent adjacent spread of tumor from the nasopharyngeal mass. BILATERAL level 2 lymph nodes, greater on the LEFT, 15 mm short axis no definite level 3, level 4, or level 5 nodal enlargement. Vascular: Patent. Limited intracranial: Not assessed. Visualized orbits: Grossly negative, partially visualized. Mastoids and visualized paranasal sinuses: Layering fluid in the RIGHT sphenoid sinus. Frontal sinus not visualized. No mastoid effusions. Skeleton: Spondylosis.  No osseous destructive lesion. Upper chest: LEFT upper lobe mass, and numerous  pulmonary nodules; see report of chest CT 07/22/2017. Other: None. 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 Enlarged RIGHT retropharyngeal lymph node could represent local spread, and contribute to the patient's RIGHT-sided symptoms. BILATERAL level 2 adenopathy, also malignant-appearing. Large RIGHT thyroid nodule described in 2009, appears similar; tissue sampling was recommended at that time although it is unclear if this occurred. Findings discussed with ordering provider at the time dictation. Electronically Signed   By: Staci Righter M.D.   On: 07/24/2017 11:40   Ct Chest W Contrast  Result Date: 07/22/2017 CLINICAL DATA:  77 year old female with rectal bleeding. EXAM: CT CHEST, ABDOMEN, AND PELVIS WITH CONTRAST TECHNIQUE: Multidetector CT imaging of the chest, abdomen and pelvis was performed following the standard protocol during bolus administration of intravenous contrast. CONTRAST:  70mL OMNIPAQUE IOHEXOL 300 MG/ML  SOLN COMPARISON:  None. FINDINGS: CT CHEST FINDINGS Cardiovascular: There is no cardiomegaly or pericardial effusion. There is coronary vascular calcification primarily involving the LAD. Mild atherosclerotic calcification of the aortic arch. The visualized origins of the great vessels of the aortic arch are patent. The central pulmonary arteries are grossly unremarkable. Mediastinum/Nodes: There is no hilar or mediastinal adenopathy. The esophagus is grossly unremarkable. There is a 2.5 x 3.9 cm right thyroid cystic or necrotic nodule with multiple enhancing intracystic nodules. Further evaluation with thyroid ultrasound recommended. Additional smaller nodules noted in the left thyroid lobe. Lungs/Pleura: There is a 4.5 x 5.2 cm pleural based mass in the left upper  lobe. There are multiple small bilateral nodular densities which may represent metastatic disease, or an atypical infection. There is  no pleural effusion or pneumothorax. The central airways are patent. Musculoskeletal: There is no axillary adenopathy. Surgical clips of the left axilla and left-sided mastectomy noted. There is osteopenia with degenerative changes of the spine. There is diffuse heterogeneity of the spine which may be secondary to osteopenia or represent small metastatic disease. CT ABDOMEN PELVIS FINDINGS There is no intra-abdominal free air or free fluid. Hepatobiliary: Subcentimeter hypodense lesion in the anterior liver (series 2, image 46) is too small to characterize. No intrahepatic biliary ductal dilatation. Cholecystectomy. Pancreas: Unremarkable. No pancreatic ductal dilatation or surrounding inflammatory changes. Spleen: Normal in size without focal abnormality. Adrenals/Urinary Tract: The adrenal glands are unremarkable. Multiple bilateral renal cysts as well as smaller hypodense lesions which are too small to characterize. There is no hydronephrosis on either side. There is symmetric enhancement and excretion of contrast by both kidneys. The visualized ureters and urinary bladder appear unremarkable. Stomach/Bowel: There is thickening of the wall of the duodenum and jejunal loops in the left upper abdomen with luminal dilatation highly suspicious for an infiltrative/neoplastic process such as lymphoma. There is thickening and luminal dilatation of a segment of bowel in the pelvis also concerning for lymphoma. Mural hemorrhage within the bowel wall is a less likely possibility if the patient is on anticoagulation. There is no bowel obstruction. There is colonic diverticulosis without active inflammatory changes. Appendectomy. Vascular/Lymphatic: Moderate aortoiliac atherosclerotic disease. No portal venous gas. There is no adenopathy. Reproductive: Hysterectomy. There is a 16 mm right ovarian cyst. The left ovary is not visualized with certainty Other: None Musculoskeletal: Osteopenia with degenerative changes of the  spine no acute osseous pathology. IMPRESSION: 1. Left upper lobe pleural based mass. 2. Innumerable small pulmonary nodules may represent metastatic disease versus atypical infection. Clinical correlation is recommended. 3. Segment of bowel wall thickening and luminal dilatation involving the duodenum, jejunum, as well as distal small bowel suspicious for bowel lymphoma. PET-CT is recommended for further evaluation of pulmonary and bowel findings. 4. Colonic diverticulosis.  No bowel obstruction. 5. Osteopenia. Heterogeneity of the spine may be related to osteopenia although small metastatic lesions are not excluded. 6. Left mastectomy. 7.  Aortic Atherosclerosis (ICD10-I70.0). Electronically Signed   By: Anner Crete M.D.   On: 07/22/2017 23:44   Ct Abdomen Pelvis W Contrast  Result Date: 07/22/2017 CLINICAL DATA:  77 year old female with rectal bleeding. EXAM: CT CHEST, ABDOMEN, AND PELVIS WITH CONTRAST TECHNIQUE: Multidetector CT imaging of the chest, abdomen and pelvis was performed following the standard protocol during bolus administration of intravenous contrast. CONTRAST:  46mL OMNIPAQUE IOHEXOL 300 MG/ML  SOLN COMPARISON:  None. FINDINGS: CT CHEST FINDINGS Cardiovascular: There is no cardiomegaly or pericardial effusion. There is coronary vascular calcification primarily involving the LAD. Mild atherosclerotic calcification of the aortic arch. The visualized origins of the great vessels of the aortic arch are patent. The central pulmonary arteries are grossly unremarkable. Mediastinum/Nodes: There is no hilar or mediastinal adenopathy. The esophagus is grossly unremarkable. There is a 2.5 x 3.9 cm right thyroid cystic or necrotic nodule with multiple enhancing intracystic nodules. Further evaluation with thyroid ultrasound recommended. Additional smaller nodules noted in the left thyroid lobe. Lungs/Pleura: There is a 4.5 x 5.2 cm pleural based mass in the left upper lobe. There are multiple small  bilateral nodular densities which may represent metastatic disease, or an atypical infection. There is no pleural effusion or pneumothorax.  The central airways are patent. Musculoskeletal: There is no axillary adenopathy. Surgical clips of the left axilla and left-sided mastectomy noted. There is osteopenia with degenerative changes of the spine. There is diffuse heterogeneity of the spine which may be secondary to osteopenia or represent small metastatic disease. CT ABDOMEN PELVIS FINDINGS There is no intra-abdominal free air or free fluid. Hepatobiliary: Subcentimeter hypodense lesion in the anterior liver (series 2, image 46) is too small to characterize. No intrahepatic biliary ductal dilatation. Cholecystectomy. Pancreas: Unremarkable. No pancreatic ductal dilatation or surrounding inflammatory changes. Spleen: Normal in size without focal abnormality. Adrenals/Urinary Tract: The adrenal glands are unremarkable. Multiple bilateral renal cysts as well as smaller hypodense lesions which are too small to characterize. There is no hydronephrosis on either side. There is symmetric enhancement and excretion of contrast by both kidneys. The visualized ureters and urinary bladder appear unremarkable. Stomach/Bowel: There is thickening of the wall of the duodenum and jejunal loops in the left upper abdomen with luminal dilatation highly suspicious for an infiltrative/neoplastic process such as lymphoma. There is thickening and luminal dilatation of a segment of bowel in the pelvis also concerning for lymphoma. Mural hemorrhage within the bowel wall is a less likely possibility if the patient is on anticoagulation. There is no bowel obstruction. There is colonic diverticulosis without active inflammatory changes. Appendectomy. Vascular/Lymphatic: Moderate aortoiliac atherosclerotic disease. No portal venous gas. There is no adenopathy. Reproductive: Hysterectomy. There is a 16 mm right ovarian cyst. The left ovary is not  visualized with certainty Other: None Musculoskeletal: Osteopenia with degenerative changes of the spine no acute osseous pathology. IMPRESSION: 1. Left upper lobe pleural based mass. 2. Innumerable small pulmonary nodules may represent metastatic disease versus atypical infection. Clinical correlation is recommended. 3. Segment of bowel wall thickening and luminal dilatation involving the duodenum, jejunum, as well as distal small bowel suspicious for bowel lymphoma. PET-CT is recommended for further evaluation of pulmonary and bowel findings. 4. Colonic diverticulosis.  No bowel obstruction. 5. Osteopenia. Heterogeneity of the spine may be related to osteopenia although small metastatic lesions are not excluded. 6. Left mastectomy. 7.  Aortic Atherosclerosis (ICD10-I70.0). Electronically Signed   By: Anner Crete M.D.   On: 07/22/2017 23:44   Ct Maxillofacial Wo Contrast  Result Date: 07/22/2017 CLINICAL DATA:  Pt c/o sinus pressure with pain and overall weakness since being diagnosed with sinus infection and uti at the end of June. pt complained of rt side swelling just below mandible EXAM: CT MAXILLOFACIAL WITHOUT CONTRAST TECHNIQUE: Multidetector CT imaging of the maxillofacial structures was performed. Multiplanar CT image reconstructions were also generated. COMPARISON:  None. 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 Multiple teeth are absent. There is a cavity of the remaining right maxillary molar. 1.0 by 0.6 by 0.8 cm lesion along the upper margin of the right frontal sinus has some endosteal scalloping and some faint spiculations of bone, but generally nonaggressive characteristics. There is some bony demineralization.  Cervical spondylosis noted. Orbits: Unremarkable Sinuses: Mild chronic right sphenoid sinusitis. Soft tissues: Posterior nasopharyngeal soft tissue fullness extending somewhat into the right nasal cavity. Polypoid lesion or mass not excluded, and  inspection is recommended. Left level II lymph node 1.3 cm in short axis on image 81/2. Right level IIb lymph node 1.0 cm in short axis. Suspected leftward deviation of the glottis, although there is extensive motion artifact making this difficult to be certain of. Limited intracranial: Unremarkable IMPRESSION: 1. Posterior nasopharyngeal soft tissue fullness extending somewhat into  the posterior aspect of the right nasal cavity. Malignancy is not excluded and inspection of the posterior nasopharynx is recommended. 2. Bilateral mild level 2 adenopathy, also raising some concern for potential malignancy. 3. Small expansile lesion with endosteal scalloping along the upper margin of the right frontal sinus. This has mostly nonaggressive characteristics, suggesting hemangioma, cholesterol granuloma, or similar lesion. 4. Bilateral torus mandibularis. 5. Cavity of the remaining right maxillary molar. 6. Mild chronic right sphenoid sinusitis. 7. Bony demineralization. Electronically Signed   By: Van Clines M.D.   On: 07/22/2017 21:53        Discharge Exam: Vitals:   07/25/17 0535 07/25/17 1454  BP: (!) 125/58 137/72  Pulse: (!) 102 (!) 101  Resp: (!) 22 20  Temp: 98.5 F (36.9 C) 98.7 F (37.1 C)  SpO2: 96% 100%   Vitals:   07/24/17 2030 07/24/17 2100 07/25/17 0535 07/25/17 1454  BP:  (!) 142/62 (!) 125/58 137/72  Pulse:  93 (!) 102 (!) 101  Resp:  18 (!) 22 20  Temp:  98 F (36.7 C) 98.5 F (36.9 C) 98.7 F (37.1 C)  TempSrc:  Oral Oral Oral  SpO2: 95% 100% 96% 100%  Weight:      Height:        General: Pt is alert, awake, not in acute distress Cardiovascular: RRR, S1/S2 +, no rubs, no gallops Respiratory: CTA bilaterally, no wheezing, no rhonchi Abdominal: Soft, NT, ND, bowel sounds + Extremities: no edema, no cyanosis   The results of significant diagnostics from this hospitalization (including imaging, microbiology, ancillary and laboratory) are listed below for  reference.    Significant Diagnostic Studies: Dg Chest 2 View  Result Date: 07/22/2017 CLINICAL DATA:  Weakness. Sinus infection. History of breast cancer. EXAM: CHEST - 2 VIEW COMPARISON:  Chest radiograph April 08, 2007 FINDINGS: Homogeneously dense 4.5 cm LEFT upper lobe mass, possible pleural based. No pleural effusion. Mild bronchitic changes. Cardiomediastinal silhouette is normal. Calcified aortic arch. Surgical clips LEFT chest wall. Osseous structures are nonacute. IMPRESSION: 1. New LEFT upper lobe mass, potentially pleural based. Recommend CT chest with contrast. 2. Mild bronchitic changes. 3.  Aortic Atherosclerosis (ICD10-I70.0). 4. Acute findings discussed with and reconfirmed by Dr.JULIE HAVILAND on 07/22/2017 at 9:58 pm. Electronically Signed   By: Elon Alas M.D.   On: 07/22/2017 21:59   Ct Soft Tissue Neck W Contrast  Result Date: 07/24/2017 CLINICAL DATA:  Sinus congestion with RIGHT-sided throat pain. EXAM: CT NECK WITH CONTRAST TECHNIQUE: Multidetector CT imaging of the neck was performed using the standard protocol following the bolus administration of intravenous contrast. CONTRAST:  35mL OMNIPAQUE IOHEXOL 300 MG/ML  SOLN COMPARISON:  CT maxillofacial 07/22/2017. CT chest, abdomen and pelvis 07/22/2017. thyroid sonography 08/28/2007. FINDINGS: Pharynx and larynx: Bulky nasopharyngeal mass, filling the nasopharynx, displacing the soft palate and uvula anteriorly, approximate 24 x 28 x 37 mm. No eustachian tube dysfunction. No oropharyngeal or hypopharyngeal masses. Normal larynx. Salivary glands: No inflammation, mass, or stone. Thyroid: Bulky mass involving much of the RIGHT lobe of the thyroid, with nodular solid, cystic, and peripherally calcified components, measuring 28 x 39 x 52 mm. This mass was present in 2009, similar size, and tissue sampling was recommended in 2009, but I have no indication of whether this was actually performed. Based on the sonographic appearance at  that time as well as the CT appearance today, those recommendations remain pertinent. Lymph nodes: Malignant-appearing adenopathy in the neck. RIGHT retropharyngeal node of root VA, greater than  2 cm long axis, could represent adjacent spread of tumor from the nasopharyngeal mass. BILATERAL level 2 lymph nodes, greater on the LEFT, 15 mm short axis no definite level 3, level 4, or level 5 nodal enlargement. Vascular: Patent. Limited intracranial: Not assessed. Visualized orbits: Grossly negative, partially visualized. Mastoids and visualized paranasal sinuses: Layering fluid in the RIGHT sphenoid sinus. Frontal sinus not visualized. No mastoid effusions. Skeleton: Spondylosis.  No osseous destructive lesion. Upper chest: LEFT upper lobe mass, and numerous pulmonary nodules; see report of chest CT 07/22/2017. Other: None. 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 Enlarged RIGHT retropharyngeal lymph node could represent local spread, and contribute to the patient's RIGHT-sided symptoms. BILATERAL level 2 adenopathy, also malignant-appearing. Large RIGHT thyroid nodule described in 2009, appears similar; tissue sampling was recommended at that time although it is unclear if this occurred. Findings discussed with ordering provider at the time dictation. Electronically Signed   By: Staci Righter M.D.   On: 07/24/2017 11:40   Ct Chest W Contrast  Result Date: 07/22/2017 CLINICAL DATA:  77 year old female with rectal bleeding. EXAM: CT CHEST, ABDOMEN, AND PELVIS WITH CONTRAST TECHNIQUE: Multidetector CT imaging of the chest, abdomen and pelvis was performed following the standard protocol during bolus administration of intravenous contrast. CONTRAST:  96mL OMNIPAQUE IOHEXOL 300 MG/ML  SOLN COMPARISON:  None. FINDINGS: CT CHEST FINDINGS Cardiovascular: There is no cardiomegaly or pericardial effusion. There is coronary vascular  calcification primarily involving the LAD. Mild atherosclerotic calcification of the aortic arch. The visualized origins of the great vessels of the aortic arch are patent. The central pulmonary arteries are grossly unremarkable. Mediastinum/Nodes: There is no hilar or mediastinal adenopathy. The esophagus is grossly unremarkable. There is a 2.5 x 3.9 cm right thyroid cystic or necrotic nodule with multiple enhancing intracystic nodules. Further evaluation with thyroid ultrasound recommended. Additional smaller nodules noted in the left thyroid lobe. Lungs/Pleura: There is a 4.5 x 5.2 cm pleural based mass in the left upper lobe. There are multiple small bilateral nodular densities which may represent metastatic disease, or an atypical infection. There is no pleural effusion or pneumothorax. The central airways are patent. Musculoskeletal: There is no axillary adenopathy. Surgical clips of the left axilla and left-sided mastectomy noted. There is osteopenia with degenerative changes of the spine. There is diffuse heterogeneity of the spine which may be secondary to osteopenia or represent small metastatic disease. CT ABDOMEN PELVIS FINDINGS There is no intra-abdominal free air or free fluid. Hepatobiliary: Subcentimeter hypodense lesion in the anterior liver (series 2, image 46) is too small to characterize. No intrahepatic biliary ductal dilatation. Cholecystectomy. Pancreas: Unremarkable. No pancreatic ductal dilatation or surrounding inflammatory changes. Spleen: Normal in size without focal abnormality. Adrenals/Urinary Tract: The adrenal glands are unremarkable. Multiple bilateral renal cysts as well as smaller hypodense lesions which are too small to characterize. There is no hydronephrosis on either side. There is symmetric enhancement and excretion of contrast by both kidneys. The visualized ureters and urinary bladder appear unremarkable. Stomach/Bowel: There is thickening of the wall of the duodenum and  jejunal loops in the left upper abdomen with luminal dilatation highly suspicious for an infiltrative/neoplastic process such as lymphoma. There is thickening and luminal dilatation of a segment of bowel in the pelvis also concerning for lymphoma. Mural hemorrhage within the bowel wall is a less likely possibility if the patient is on anticoagulation. There is no bowel obstruction. There is colonic diverticulosis without active  inflammatory changes. Appendectomy. Vascular/Lymphatic: Moderate aortoiliac atherosclerotic disease. No portal venous gas. There is no adenopathy. Reproductive: Hysterectomy. There is a 16 mm right ovarian cyst. The left ovary is not visualized with certainty Other: None Musculoskeletal: Osteopenia with degenerative changes of the spine no acute osseous pathology. IMPRESSION: 1. Left upper lobe pleural based mass. 2. Innumerable small pulmonary nodules may represent metastatic disease versus atypical infection. Clinical correlation is recommended. 3. Segment of bowel wall thickening and luminal dilatation involving the duodenum, jejunum, as well as distal small bowel suspicious for bowel lymphoma. PET-CT is recommended for further evaluation of pulmonary and bowel findings. 4. Colonic diverticulosis.  No bowel obstruction. 5. Osteopenia. Heterogeneity of the spine may be related to osteopenia although small metastatic lesions are not excluded. 6. Left mastectomy. 7.  Aortic Atherosclerosis (ICD10-I70.0). Electronically Signed   By: Anner Crete M.D.   On: 07/22/2017 23:44   Ct Abdomen Pelvis W Contrast  Result Date: 07/22/2017 CLINICAL DATA:  77 year old female with rectal bleeding. EXAM: CT CHEST, ABDOMEN, AND PELVIS WITH CONTRAST TECHNIQUE: Multidetector CT imaging of the chest, abdomen and pelvis was performed following the standard protocol during bolus administration of intravenous contrast. CONTRAST:  43mL OMNIPAQUE IOHEXOL 300 MG/ML  SOLN COMPARISON:  None. FINDINGS: CT CHEST  FINDINGS Cardiovascular: There is no cardiomegaly or pericardial effusion. There is coronary vascular calcification primarily involving the LAD. Mild atherosclerotic calcification of the aortic arch. The visualized origins of the great vessels of the aortic arch are patent. The central pulmonary arteries are grossly unremarkable. Mediastinum/Nodes: There is no hilar or mediastinal adenopathy. The esophagus is grossly unremarkable. There is a 2.5 x 3.9 cm right thyroid cystic or necrotic nodule with multiple enhancing intracystic nodules. Further evaluation with thyroid ultrasound recommended. Additional smaller nodules noted in the left thyroid lobe. Lungs/Pleura: There is a 4.5 x 5.2 cm pleural based mass in the left upper lobe. There are multiple small bilateral nodular densities which may represent metastatic disease, or an atypical infection. There is no pleural effusion or pneumothorax. The central airways are patent. Musculoskeletal: There is no axillary adenopathy. Surgical clips of the left axilla and left-sided mastectomy noted. There is osteopenia with degenerative changes of the spine. There is diffuse heterogeneity of the spine which may be secondary to osteopenia or represent small metastatic disease. CT ABDOMEN PELVIS FINDINGS There is no intra-abdominal free air or free fluid. Hepatobiliary: Subcentimeter hypodense lesion in the anterior liver (series 2, image 46) is too small to characterize. No intrahepatic biliary ductal dilatation. Cholecystectomy. Pancreas: Unremarkable. No pancreatic ductal dilatation or surrounding inflammatory changes. Spleen: Normal in size without focal abnormality. Adrenals/Urinary Tract: The adrenal glands are unremarkable. Multiple bilateral renal cysts as well as smaller hypodense lesions which are too small to characterize. There is no hydronephrosis on either side. There is symmetric enhancement and excretion of contrast by both kidneys. The visualized ureters and  urinary bladder appear unremarkable. Stomach/Bowel: There is thickening of the wall of the duodenum and jejunal loops in the left upper abdomen with luminal dilatation highly suspicious for an infiltrative/neoplastic process such as lymphoma. There is thickening and luminal dilatation of a segment of bowel in the pelvis also concerning for lymphoma. Mural hemorrhage within the bowel wall is a less likely possibility if the patient is on anticoagulation. There is no bowel obstruction. There is colonic diverticulosis without active inflammatory changes. Appendectomy. Vascular/Lymphatic: Moderate aortoiliac atherosclerotic disease. No portal venous gas. There is no adenopathy. Reproductive: Hysterectomy. There is a 16 mm right ovarian  cyst. The left ovary is not visualized with certainty Other: None Musculoskeletal: Osteopenia with degenerative changes of the spine no acute osseous pathology. IMPRESSION: 1. Left upper lobe pleural based mass. 2. Innumerable small pulmonary nodules may represent metastatic disease versus atypical infection. Clinical correlation is recommended. 3. Segment of bowel wall thickening and luminal dilatation involving the duodenum, jejunum, as well as distal small bowel suspicious for bowel lymphoma. PET-CT is recommended for further evaluation of pulmonary and bowel findings. 4. Colonic diverticulosis.  No bowel obstruction. 5. Osteopenia. Heterogeneity of the spine may be related to osteopenia although small metastatic lesions are not excluded. 6. Left mastectomy. 7.  Aortic Atherosclerosis (ICD10-I70.0). Electronically Signed   By: Anner Crete M.D.   On: 07/22/2017 23:44   Ct Maxillofacial Wo Contrast  Result Date: 07/22/2017 CLINICAL DATA:  Pt c/o sinus pressure with pain and overall weakness since being diagnosed with sinus infection and uti at the end of June. pt complained of rt side swelling just below mandible EXAM: CT MAXILLOFACIAL WITHOUT CONTRAST TECHNIQUE: Multidetector  CT imaging of the maxillofacial structures was performed. Multiplanar CT image reconstructions were also generated. COMPARISON:  None. 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 Multiple teeth are absent. There is a cavity of the remaining right maxillary molar. 1.0 by 0.6 by 0.8 cm lesion along the upper margin of the right frontal sinus has some endosteal scalloping and some faint spiculations of bone, but generally nonaggressive characteristics. There is some bony demineralization.  Cervical spondylosis noted. Orbits: Unremarkable Sinuses: Mild chronic right sphenoid sinusitis. Soft tissues: Posterior nasopharyngeal soft tissue fullness extending somewhat into the right nasal cavity. Polypoid lesion or mass not excluded, and inspection is recommended. Left level II lymph node 1.3 cm in short axis on image 81/2. Right level IIb lymph node 1.0 cm in short axis. Suspected leftward deviation of the glottis, although there is extensive motion artifact making this difficult to be certain of. Limited intracranial: Unremarkable IMPRESSION: 1. Posterior nasopharyngeal soft tissue fullness extending somewhat into the posterior aspect of the right nasal cavity. Malignancy is not excluded and inspection of the posterior nasopharynx is recommended. 2. Bilateral mild level 2 adenopathy, also raising some concern for potential malignancy. 3. Small expansile lesion with endosteal scalloping along the upper margin of the right frontal sinus. This has mostly nonaggressive characteristics, suggesting hemangioma, cholesterol granuloma, or similar lesion. 4. Bilateral torus mandibularis. 5. Cavity of the remaining right maxillary molar. 6. Mild chronic right sphenoid sinusitis. 7. Bony demineralization. Electronically Signed   By: Van Clines M.D.   On: 07/22/2017 21:53     Microbiology: Recent Results (from the past 240 hour(s))  Culture, Urine     Status: Abnormal   Collection Time:  07/23/17  2:30 PM  Result Value Ref Range Status   Specimen Description   Final    URINE, CLEAN CATCH Performed at [facility], [address], [place], [place] [address]    Special Requests   Final    NONE Performed at [facility], [address], [place], [place] [address]    Culture (A)  Final    <10,000 COLONIES/mL INSIGNIFICANT GROWTH Performed at [facility] [address] [place], [place] [address]    Report Status 07/25/2017 FINAL  Final     Labs: Basic Metabolic Panel: Recent Labs  Lab 07/22/17 2153 07/23/17 1004 07/24/17 0639 07/25/17 0526  NA 139 140 139 142  K 4.3 4.3 4.0 4.0  CL 103 106 104  105  CO2 28 27 29 30   GLUCOSE 92 108* 110* 112*  BUN 22 15 12 14   CREATININE 1.16* 1.00 0.81 0.82  CALCIUM 8.4* 8.2* 8.3* 8.5*   Liver Function Tests: Recent Labs  Lab 07/22/17 2153  AST 17  ALT 13  ALKPHOS 58  BILITOT 0.4  PROT 5.8*  ALBUMIN 2.5*   No results for input(s): LIPASE, AMYLASE in the last 168 hours. No results for input(s): AMMONIA in the last 168 hours. CBC: Recent Labs  Lab 07/22/17 2153 07/23/17 1004 07/23/17 1448 07/24/17 0639 07/25/17 0526  WBC 13.3* 14.5*  --  14.0* 15.8*  NEUTROABS 8.3* 10.9*  --  10.6*  --   HGB 7.7* 10.6* 10.2* 10.2* 10.2*  HCT 23.9* 32.2* 31.0* 31.7* 31.6*  MCV 90.5 91.7  --  92.2 92.1  PLT 386 314  --  278 305   Cardiac Enzymes: No results for input(s): CKTOTAL, CKMB, CKMBINDEX, TROPONINI in the last 168 hours. BNP: Invalid input(s): POCBNP CBG: No results for input(s): GLUCAP in the last 168 hours.  Time coordinating discharge:  36 minutes  Signed:  Orson Eva, DO [facility] Pager: [phone] 07/25/2017, 6:11 PM

## 2017-07-26 NOTE — Progress Notes (Signed)
IV removed, 2x2 gauze and paper tape applied to site, patient tolerated well. Reviewed AVS with patient and patient's son, both verbalized understanding.  Patient to be transported home by her son.

## 2017-07-29 ENCOUNTER — Encounter (HOSPITAL_COMMUNITY): Payer: Self-pay | Admitting: Internal Medicine

## 2017-07-31 ENCOUNTER — Other Ambulatory Visit: Payer: Self-pay | Admitting: Otolaryngology

## 2017-08-01 ENCOUNTER — Emergency Department (HOSPITAL_COMMUNITY): Payer: Medicare Other

## 2017-08-01 ENCOUNTER — Inpatient Hospital Stay (HOSPITAL_COMMUNITY)
Admission: EM | Admit: 2017-08-01 | Discharge: 2017-08-07 | DRG: 375 | Disposition: E | Payer: Medicare Other | Attending: Internal Medicine | Admitting: Internal Medicine

## 2017-08-01 ENCOUNTER — Other Ambulatory Visit: Payer: Self-pay

## 2017-08-01 ENCOUNTER — Encounter (HOSPITAL_COMMUNITY): Payer: Self-pay | Admitting: Emergency Medicine

## 2017-08-01 ENCOUNTER — Inpatient Hospital Stay (HOSPITAL_COMMUNITY): Payer: Medicare Other | Admitting: Internal Medicine

## 2017-08-01 DIAGNOSIS — I959 Hypotension, unspecified: Secondary | ICD-10-CM | POA: Diagnosis present

## 2017-08-01 DIAGNOSIS — Z832 Family history of diseases of the blood and blood-forming organs and certain disorders involving the immune mechanism: Secondary | ICD-10-CM | POA: Diagnosis not present

## 2017-08-01 DIAGNOSIS — C801 Malignant (primary) neoplasm, unspecified: Secondary | ICD-10-CM | POA: Diagnosis present

## 2017-08-01 DIAGNOSIS — C784 Secondary malignant neoplasm of small intestine: Secondary | ICD-10-CM | POA: Diagnosis present

## 2017-08-01 DIAGNOSIS — E038 Other specified hypothyroidism: Secondary | ICD-10-CM | POA: Diagnosis not present

## 2017-08-01 DIAGNOSIS — C3490 Malignant neoplasm of unspecified part of unspecified bronchus or lung: Secondary | ICD-10-CM | POA: Diagnosis present

## 2017-08-01 DIAGNOSIS — N2889 Other specified disorders of kidney and ureter: Secondary | ICD-10-CM | POA: Diagnosis present

## 2017-08-01 DIAGNOSIS — E119 Type 2 diabetes mellitus without complications: Secondary | ICD-10-CM | POA: Diagnosis present

## 2017-08-01 DIAGNOSIS — D62 Acute posthemorrhagic anemia: Secondary | ICD-10-CM | POA: Diagnosis present

## 2017-08-01 DIAGNOSIS — I10 Essential (primary) hypertension: Secondary | ICD-10-CM | POA: Diagnosis present

## 2017-08-01 DIAGNOSIS — C3412 Malignant neoplasm of upper lobe, left bronchus or lung: Secondary | ICD-10-CM | POA: Diagnosis present

## 2017-08-01 DIAGNOSIS — D72829 Elevated white blood cell count, unspecified: Secondary | ICD-10-CM | POA: Diagnosis present

## 2017-08-01 DIAGNOSIS — E860 Dehydration: Secondary | ICD-10-CM | POA: Diagnosis present

## 2017-08-01 DIAGNOSIS — C799 Secondary malignant neoplasm of unspecified site: Secondary | ICD-10-CM | POA: Diagnosis present

## 2017-08-01 DIAGNOSIS — E039 Hypothyroidism, unspecified: Secondary | ICD-10-CM | POA: Diagnosis present

## 2017-08-01 DIAGNOSIS — Z515 Encounter for palliative care: Secondary | ICD-10-CM | POA: Diagnosis not present

## 2017-08-01 DIAGNOSIS — K6289 Other specified diseases of anus and rectum: Secondary | ICD-10-CM

## 2017-08-01 DIAGNOSIS — Z66 Do not resuscitate: Secondary | ICD-10-CM | POA: Diagnosis present

## 2017-08-01 DIAGNOSIS — Z7984 Long term (current) use of oral hypoglycemic drugs: Secondary | ICD-10-CM

## 2017-08-01 DIAGNOSIS — Z853 Personal history of malignant neoplasm of breast: Secondary | ICD-10-CM | POA: Diagnosis not present

## 2017-08-01 DIAGNOSIS — K5900 Constipation, unspecified: Secondary | ICD-10-CM | POA: Diagnosis present

## 2017-08-01 DIAGNOSIS — K5903 Drug induced constipation: Secondary | ICD-10-CM | POA: Diagnosis not present

## 2017-08-01 DIAGNOSIS — Z90710 Acquired absence of both cervix and uterus: Secondary | ICD-10-CM

## 2017-08-01 DIAGNOSIS — D573 Sickle-cell trait: Secondary | ICD-10-CM | POA: Diagnosis present

## 2017-08-01 DIAGNOSIS — K5641 Fecal impaction: Secondary | ICD-10-CM | POA: Diagnosis present

## 2017-08-01 DIAGNOSIS — R7989 Other specified abnormal findings of blood chemistry: Secondary | ICD-10-CM | POA: Diagnosis not present

## 2017-08-01 DIAGNOSIS — K56609 Unspecified intestinal obstruction, unspecified as to partial versus complete obstruction: Secondary | ICD-10-CM

## 2017-08-01 DIAGNOSIS — Z9012 Acquired absence of left breast and nipple: Secondary | ICD-10-CM

## 2017-08-01 DIAGNOSIS — K921 Melena: Secondary | ICD-10-CM | POA: Diagnosis present

## 2017-08-01 DIAGNOSIS — R531 Weakness: Secondary | ICD-10-CM

## 2017-08-01 DIAGNOSIS — D500 Iron deficiency anemia secondary to blood loss (chronic): Secondary | ICD-10-CM | POA: Diagnosis not present

## 2017-08-01 DIAGNOSIS — R109 Unspecified abdominal pain: Secondary | ICD-10-CM | POA: Diagnosis not present

## 2017-08-01 DIAGNOSIS — Z9221 Personal history of antineoplastic chemotherapy: Secondary | ICD-10-CM

## 2017-08-01 DIAGNOSIS — R0682 Tachypnea, not elsewhere classified: Secondary | ICD-10-CM

## 2017-08-01 DIAGNOSIS — K922 Gastrointestinal hemorrhage, unspecified: Secondary | ICD-10-CM | POA: Diagnosis not present

## 2017-08-01 DIAGNOSIS — J392 Other diseases of pharynx: Secondary | ICD-10-CM | POA: Diagnosis present

## 2017-08-01 DIAGNOSIS — R1110 Vomiting, unspecified: Secondary | ICD-10-CM

## 2017-08-01 DIAGNOSIS — E872 Acidosis: Secondary | ICD-10-CM | POA: Diagnosis present

## 2017-08-01 LAB — CBC WITH DIFFERENTIAL/PLATELET
Basophils Absolute: 0 10*3/uL (ref 0.0–0.1)
Basophils Relative: 0 %
EOS PCT: 0 %
Eosinophils Absolute: 0 10*3/uL (ref 0.0–0.7)
HCT: 23.4 % — ABNORMAL LOW (ref 36.0–46.0)
Hemoglobin: 7.3 g/dL — ABNORMAL LOW (ref 12.0–15.0)
LYMPHS ABS: 1.7 10*3/uL (ref 0.7–4.0)
LYMPHS PCT: 7 %
MCH: 29.6 pg (ref 26.0–34.0)
MCHC: 31.2 g/dL (ref 30.0–36.0)
MCV: 94.7 fL (ref 78.0–100.0)
MONOS PCT: 3 %
Monocytes Absolute: 0.8 10*3/uL (ref 0.1–1.0)
Neutro Abs: 21.4 10*3/uL — ABNORMAL HIGH (ref 1.7–7.7)
Neutrophils Relative %: 90 %
PLATELETS: 317 10*3/uL (ref 150–400)
RBC: 2.47 MIL/uL — AB (ref 3.87–5.11)
RDW: 15.2 % (ref 11.5–15.5)
WBC: 24 10*3/uL — ABNORMAL HIGH (ref 4.0–10.5)

## 2017-08-01 LAB — URINALYSIS, ROUTINE W REFLEX MICROSCOPIC
BILIRUBIN URINE: NEGATIVE
Glucose, UA: NEGATIVE mg/dL
HGB URINE DIPSTICK: NEGATIVE
KETONES UR: NEGATIVE mg/dL
Leukocytes, UA: NEGATIVE
Nitrite: NEGATIVE
PROTEIN: NEGATIVE mg/dL
Specific Gravity, Urine: 1.026 (ref 1.005–1.030)
pH: 7 (ref 5.0–8.0)

## 2017-08-01 LAB — COMPREHENSIVE METABOLIC PANEL
ALT: 14 U/L (ref 0–44)
AST: 22 U/L (ref 15–41)
Albumin: 2.1 g/dL — ABNORMAL LOW (ref 3.5–5.0)
Alkaline Phosphatase: 52 U/L (ref 38–126)
Anion gap: 9 (ref 5–15)
BUN: 20 mg/dL (ref 8–23)
CALCIUM: 8.2 mg/dL — AB (ref 8.9–10.3)
CO2: 31 mmol/L (ref 22–32)
CREATININE: 0.9 mg/dL (ref 0.44–1.00)
Chloride: 100 mmol/L (ref 98–111)
Glucose, Bld: 160 mg/dL — ABNORMAL HIGH (ref 70–99)
Potassium: 4.7 mmol/L (ref 3.5–5.1)
Sodium: 140 mmol/L (ref 135–145)
Total Bilirubin: 0.4 mg/dL (ref 0.3–1.2)
Total Protein: 5.1 g/dL — ABNORMAL LOW (ref 6.5–8.1)

## 2017-08-01 LAB — LIPASE, BLOOD: Lipase: 34 U/L (ref 11–51)

## 2017-08-01 LAB — LACTIC ACID, PLASMA
LACTIC ACID, VENOUS: 3 mmol/L — AB (ref 0.5–1.9)
Lactic Acid, Venous: 2.2 mmol/L (ref 0.5–1.9)

## 2017-08-01 LAB — PROTIME-INR
INR: 1.08
PROTHROMBIN TIME: 13.9 s (ref 11.4–15.2)

## 2017-08-01 LAB — PREPARE RBC (CROSSMATCH)

## 2017-08-01 LAB — TROPONIN I: Troponin I: <0.03 ng/mL (ref <0.03)

## 2017-08-01 LAB — POC OCCULT BLOOD, ED: Fecal Occult Bld: POSITIVE — AB

## 2017-08-01 MED ORDER — ALPRAZOLAM 0.5 MG PO TABS
0.2500 mg | ORAL_TABLET | Freq: Two times a day (BID) | ORAL | Status: DC | PRN
  Administered 2017-08-01: 0.5 mg via ORAL
  Filled 2017-08-01: qty 1

## 2017-08-01 MED ORDER — IOPAMIDOL (ISOVUE-300) INJECTION 61%
100.0000 mL | Freq: Once | INTRAVENOUS | Status: AC | PRN
  Administered 2017-08-01: 100 mL via INTRAVENOUS

## 2017-08-01 MED ORDER — ACETAMINOPHEN 325 MG PO TABS: 650.0000 mg | ORAL_TABLET | Freq: Four times a day (QID) | ORAL | Status: DC | PRN

## 2017-08-01 MED ORDER — MAGNESIUM CITRATE PO SOLN
1.0000 | Freq: Once | ORAL | Status: AC
  Administered 2017-08-02: 1 via ORAL
  Filled 2017-08-01: qty 296

## 2017-08-01 MED ORDER — BISACODYL 5 MG PO TBEC
10.0000 mg | DELAYED_RELEASE_TABLET | Freq: Once | ORAL | Status: AC
  Administered 2017-08-01: 10 mg via ORAL
  Filled 2017-08-01: qty 2

## 2017-08-01 MED ORDER — SODIUM CHLORIDE 0.9 % IV BOLUS (SEPSIS)
500.0000 mL | Freq: Once | INTRAVENOUS | Status: AC
  Administered 2017-08-01: 500 mL via INTRAVENOUS

## 2017-08-01 MED ORDER — SIMVASTATIN 40 MG PO TABS
40.0000 mg | ORAL_TABLET | Freq: Every day | ORAL | Status: DC
  Administered 2017-08-01: 40 mg via ORAL
  Filled 2017-08-01: qty 1

## 2017-08-01 MED ORDER — ONDANSETRON HCL 4 MG PO TABS: 4.0000 mg | ORAL_TABLET | Freq: Four times a day (QID) | ORAL | Status: DC | PRN

## 2017-08-01 MED ORDER — HYDROCODONE-ACETAMINOPHEN 5-325 MG PO TABS
1.0000 | ORAL_TABLET | ORAL | Status: DC | PRN
  Administered 2017-08-01: 1 via ORAL
  Filled 2017-08-01: qty 1

## 2017-08-01 MED ORDER — MORPHINE SULFATE (PF) 2 MG/ML IV SOLN
2.0000 mg | INTRAVENOUS | Status: DC | PRN
  Administered 2017-08-01 – 2017-08-02 (×4): 2 mg via INTRAVENOUS
  Filled 2017-08-01 (×4): qty 1

## 2017-08-01 MED ORDER — SODIUM CHLORIDE 0.9 % IV SOLN
8.0000 mg/h | INTRAVENOUS | Status: DC
  Administered 2017-08-01 – 2017-08-02 (×2): 8 mg/h via INTRAVENOUS
  Filled 2017-08-01 (×5): qty 80

## 2017-08-01 MED ORDER — METHYLNALTREXONE BROMIDE 12 MG/0.6ML ~~LOC~~ SOLN
12.0000 mg | Freq: Once | SUBCUTANEOUS | Status: AC
  Administered 2017-08-01: 12 mg via SUBCUTANEOUS
  Filled 2017-08-01: qty 0.6

## 2017-08-01 MED ORDER — ACETAMINOPHEN 500 MG PO TABS
1000.0000 mg | ORAL_TABLET | Freq: Once | ORAL | Status: AC
  Administered 2017-08-01: 1000 mg via ORAL
  Filled 2017-08-01: qty 2

## 2017-08-01 MED ORDER — SENNA 8.6 MG PO TABS
1.0000 | ORAL_TABLET | Freq: Two times a day (BID) | ORAL | Status: DC
  Administered 2017-08-01: 8.6 mg via ORAL
  Filled 2017-08-01: qty 1

## 2017-08-01 MED ORDER — SODIUM CHLORIDE 0.9 % IV SOLN
1000.0000 mL | INTRAVENOUS | Status: DC
  Administered 2017-08-01: 1000 mL via INTRAVENOUS

## 2017-08-01 MED ORDER — ACETAMINOPHEN 650 MG RE SUPP: 650.0000 mg | Freq: Four times a day (QID) | RECTAL | Status: DC | PRN

## 2017-08-01 MED ORDER — SODIUM CHLORIDE 0.45 % IV SOLN
INTRAVENOUS | Status: DC
  Administered 2017-08-01 – 2017-08-02 (×2): via INTRAVENOUS

## 2017-08-01 MED ORDER — PROCHLORPERAZINE EDISYLATE 10 MG/2ML IJ SOLN
10.0000 mg | Freq: Four times a day (QID) | INTRAMUSCULAR | Status: DC | PRN
  Administered 2017-08-02 (×3): 10 mg via INTRAVENOUS
  Filled 2017-08-01 (×4): qty 2

## 2017-08-01 MED ORDER — ONDANSETRON HCL 4 MG/2ML IJ SOLN
4.0000 mg | Freq: Four times a day (QID) | INTRAMUSCULAR | Status: DC | PRN
  Administered 2017-08-01 – 2017-08-02 (×2): 4 mg via INTRAVENOUS
  Filled 2017-08-01 (×2): qty 2

## 2017-08-01 MED ORDER — PANTOPRAZOLE SODIUM 40 MG IV SOLR: 40.0000 mg | Freq: Two times a day (BID) | INTRAVENOUS | Status: DC

## 2017-08-01 MED ORDER — DOCUSATE SODIUM 100 MG PO CAPS
100.0000 mg | ORAL_CAPSULE | Freq: Two times a day (BID) | ORAL | Status: DC
  Administered 2017-08-01: 100 mg via ORAL
  Filled 2017-08-01: qty 1

## 2017-08-01 MED ORDER — SIMETHICONE 80 MG PO CHEW
80.0000 mg | CHEWABLE_TABLET | Freq: Four times a day (QID) | ORAL | Status: DC | PRN
  Administered 2017-08-01: 80 mg via ORAL
  Filled 2017-08-01: qty 1

## 2017-08-01 MED ORDER — SODIUM CHLORIDE 0.9 % IV SOLN
80.0000 mg | Freq: Once | INTRAVENOUS | Status: AC
  Administered 2017-08-01: 80 mg via INTRAVENOUS
  Filled 2017-08-01: qty 80

## 2017-08-01 MED ORDER — ZOLPIDEM TARTRATE 5 MG PO TABS: 5.0000 mg | ORAL_TABLET | Freq: Every evening | ORAL | Status: DC | PRN

## 2017-08-01 MED ORDER — SODIUM CHLORIDE 0.9% IV SOLUTION
Freq: Once | INTRAVENOUS | Status: AC
  Administered 2017-08-01: 15:00:00 via INTRAVENOUS

## 2017-08-01 NOTE — ED Notes (Signed)
CRITICAL VALUE ALERT  Critical Value: Lactic 3.0  Date & Time Notied:  1212 08/02/27  Provider Notified: Mcmanus  Orders Received/Actions taken: none

## 2017-08-01 NOTE — ED Notes (Signed)
CRITICAL VALUE ALERT  Critical Value: lactic acid 2.2  Date & Time Notied:  07/12/2017 1425  Provider Notified: Pete Pelt  Orders Received/Actions taken: Noted

## 2017-08-01 NOTE — ED Provider Notes (Addendum)
[facility] Provider Note   CSN: [id number] Arrival date & time: 07/19/2017  1018     History   Chief Complaint Chief Complaint  Patient presents with  . Rectal Pain    HPI Casey Lopez is a 77 y.o. female.  Casey Lopez is a 77 y.o. Female with a history of hypertension, hypothyroidism, sickle cell trait, breast cancer and depression, who presents to the emergency department for evaluation of rectal pain, abdominal pain and constipation.  Patient was discharged from the hospital on 7/20 after an admission for generalized weakness where she was found to have a mass in the neck, chest and likely duodenal mass, she had melanotic stools at that time, required 2 unit blood transfusion but then hemoglobin stabilized.  Had abnormal abdominal CT and GI was consulted during admission, EGD was done which showed a duodenal mass concerning for malignancy, a biopsy was done but patient and family report they have not received the results of this today, they were supposed to see her doctor today regarding this.  She returns to the ED for constipation over the past week, she reports she has been passing gas, but has only passed very small bits of stool intermittently, which are dark black in color, no bright red blood per rectum.  Patient is not on blood thinners.  She reports some intermittent lower abdominal pain which is mild in nature, no nausea or vomiting.  Good appetite.  Family reports she continues to seem generally weaker than usual and is not getting around as well as she has been.  No fevers at home, no chest pain, shortness of breath or cough.  Patient also reports she feels dehydrated.  Patient currently resides with her son who is present at the bedside.     Past Medical History:  Diagnosis Date  . Cancer ([place])    left breast  . Depression 12/24/2010  . Goiter   . HTN (hypertension) 12/24/2010  . Hypothyroidism 12/24/2010  . Infiltrating ductal carcinoma  of breast, stage 1 ([place]) 12/24/2010  . Sickle cell trait [facility])     Patient Active Problem List   Diagnosis Date Noted  . 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 Melena 07/14/2017  . Anemia due to GI blood loss 07/16/2017  . High serum lactic acid 07/13/2017  . Leukocytosis 08/05/2017  . Constipation 07/28/2017  . Right kidney mass 07/14/2017  . DM (diabetes mellitus), type 2 ([place]) 08/05/2017  . Weakness generalized 07/10/2017  . Dehydration 07/21/2017  . Rectal pain   . Nasopharyngeal mass   . Abnormal CT of the abdomen 07/23/2017  . Abnormal CT of the chest 07/23/2017  . Abnormal CT of paranasal sinuses 07/23/2017  . UTI (urinary tract infection) 07/23/2017  . Mass of left lung   . Metastatic cancer ([place])   . Symptomatic anemia   . UGI bleed 07/22/2017  . Hx of colonic polyps   . History of colon polyps 08/01/2014  . Elevated CEA 08/01/2014  . Infiltrating ductal carcinoma of breast, stage 1 ([place]) 12/24/2010  . Depression 12/24/2010  . Hypothyroidism 12/24/2010  . HTN (hypertension) 12/24/2010    Past Surgical History:  Procedure Laterality Date  . ABDOMINAL HYSTERECTOMY    . APPENDECTOMY    . BIOPSY  07/24/2017   Procedure: BIOPSY;  Surgeon: Daneil Dolin, MD;  Location: [facility] END[facility];  Service: Endoscopy;;  duodenal biopsy  . BIOPSY THYROID    . CATARACT EXTRACTION  right eye with lens implant  . CATARACT EXTRACTION  2008   right  . CATARACT EXTRACTION W/PHACO  04/16/2011   Procedure: CATARACT EXTRACTION PHACO AND INTRAOCULAR LENS PLACEMENT (IOC);  Surgeon: Elta Guadeloupe T. Gershon Crane, MD;  Location: [facility] ORS;  Service: Ophthalmology;  Laterality: Left;  CDE=7.66  . CHOLECYSTECTOMY    . COLON RESECTION  1965   tumor 4X4 inch per patient. ?colon or other per patient  . COLONOSCOPY N/A 08/12/2014   Procedure: COLONOSCOPY;  Surgeon: Danie Binder, MD;  Location: [facility] END[facility];  Service: Endoscopy;  Laterality: N/A;  1300  . DENTAL SURGERY  11/2011  .  ESOPHAGOGASTRODUODENOSCOPY (EGD) WITH PROPOFOL N/A 07/24/2017   Procedure: ESOPHAGOGASTRODUODENOSCOPY (EGD) WITH PROPOFOL;  Surgeon: Daneil Dolin, MD;  Location: [facility] END[facility];  Service: Endoscopy;  Laterality: N/A;  . HEMORRHOID SURGERY    . MASTECTOMY  2009   left  . TONSILECTOMY, ADENOIDECTOMY, BILATERAL MYRINGOTOMY AND TUBES       OB History   None      Home Medications    Prior to Admission medications   Medication Sig Start Date End Date Taking? Authorizing Provider  ALPRAZolam Duanne Moron) 0.5 MG tablet Take 0.5-1 tablets (0.25-0.5 mg total) by mouth 2 (two) times daily as needed for anxiety. 11/01/16  Yes Holley Bouche, NP  Cholecalciferol (VITAMIN D PO) Take 1 capsule by mouth daily.   Yes [provider]  Fexofenadine-Pseudoephedrine (ALLEGRA-D 12 HOUR PO) Take 1 tablet by mouth as needed.   Yes [provider]  K-DUR 20 MEQ tablet TAKE (1) TABLET BY MOUTH ONCE DAILY. 08/14/11  Yes Baird Cancer, PA-C  metFORMIN (GLUCOPHAGE-XR) 500 MG 24 hr tablet Take 500 mg by mouth every evening.  06/27/17  Yes [provider]  pantoprazole (PROTONIX) 40 MG tablet Take 1 tablet (40 mg total) by mouth daily. 07/25/17  Yes Tat, Shanon Brow, MD  simvastatin (ZOCOR) 40 MG tablet Take 40 mg by mouth at bedtime.  12/16/11  Yes [provider]  HYDROcodone-acetaminophen (NORCO/VICODIN) 5-325 MG tablet Take 1 tablet by mouth every 4 (four) hours as needed for moderate pain. 07/25/17   Orson Eva, MD    Family History Family History  Problem Relation Age of Onset  . Breast cancer Sister   . Cancer Mother   . Sickle cell anemia Father   . Anesthesia problems Neg Hx   . Hypotension Neg Hx   . Malignant hyperthermia Neg Hx   . Pseudochol deficiency Neg Hx     Social History Social History   Tobacco Use  . Smoking status: Never Smoker  . Smokeless tobacco: Never Used  Substance Use Topics  . Alcohol use: No  . Drug use: No     Allergies   Sulfa  antibiotics; Feldene [piroxicam]; and Penicillins   Review of Systems Review of Systems  Constitutional: Positive for fatigue. Negative for appetite change, chills and fever.  Respiratory: Negative for cough and shortness of breath.   Cardiovascular: Negative for chest pain.  Gastrointestinal: Positive for abdominal pain, blood in stool, constipation and rectal pain. Negative for nausea and vomiting.  Genitourinary: Negative for dysuria and frequency.  Musculoskeletal: Negative for arthralgias, back pain and myalgias.  Skin: Negative for color change and rash.  Neurological: Positive for weakness. Negative for syncope and numbness.  All other systems reviewed and are negative.    Physical Exam Updated Vital Signs BP (!) 125/54   Pulse (!) 106   Temp 98.7 F (37.1 C) (Oral)  Resp (!) 26   Ht 5\' 7"  (1.702 m)   Wt 68.9 kg (152 lb)   SpO2 100%   BMI 23.81 kg/m   Physical Exam  Constitutional: She is oriented to person, place, and time. She appears well-developed and well-nourished. No distress.  Patient appears chronically ill, in no acute distress  HENT:  Head: Normocephalic and atraumatic.  Mouth/Throat: Oropharynx is clear and moist.  Eyes: Right eye exhibits no discharge. Left eye exhibits no discharge.  Neck: Neck supple.  Cardiovascular: Regular rhythm, normal heart sounds and intact distal pulses.  Mild tachycardia  Pulmonary/Chest: Effort normal and breath sounds normal. No stridor. No respiratory distress. She has no wheezes. She has no rales.  Respirations equal and unlabored, patient able to speak in full sentences, lungs clear to auscultation bilaterally  Abdominal: Soft. Bowel sounds are normal. She exhibits no distension and no mass. There is tenderness. There is no guarding.  Abdomen soft, nondistended, bowel sounds present throughout, there is mild lower abdominal tenderness without guarding on exam  Genitourinary:  Genitourinary Comments: Chaperone present  during rectal exam, good rectal tone, there is a large palpable hard stool ball with grossly melanotic stools on exam, patient has significant pain during exam and did not tolerate attempt at disimpaction  Musculoskeletal: She exhibits no edema or deformity.  Neurological: She is alert and oriented to person, place, and time. Coordination normal.  Skin: Skin is warm and dry. She is not diaphoretic.  Psychiatric: She has a normal mood and affect. Her behavior is normal.  Nursing note and vitals reviewed.    ED Treatments / Results  Labs (all labs ordered are listed, but only abnormal results are displayed) Labs Reviewed  CBC WITH DIFFERENTIAL/PLATELET - Abnormal; Notable for the following components:      Result Value   WBC 24.0 (*)    RBC 2.47 (*)    Hemoglobin 7.3 (*)    HCT 23.4 (*)    Neutro Abs 21.4 (*)    All other components within normal limits  COMPREHENSIVE METABOLIC PANEL - Abnormal; Notable for the following components:   Glucose, Bld 160 (*)    Calcium 8.2 (*)    Total Protein 5.1 (*)    Albumin 2.1 (*)    All other components within normal limits  URINALYSIS, ROUTINE W REFLEX MICROSCOPIC - Abnormal; Notable for the following components:   Color, Urine STRAW (*)    All other components within normal limits  LACTIC ACID, PLASMA - Abnormal; Notable for the following components:   Lactic Acid, Venous 3.0 (*)    All other components within normal limits  LACTIC ACID, PLASMA - Abnormal; Notable for the following components:   Lactic Acid, Venous 2.2 (*)    All other components within normal limits  POC OCCULT BLOOD, ED - Abnormal; Notable for the following components:   Fecal Occult Bld POSITIVE (*)    All other components within normal limits  URINE CULTURE  LIPASE, BLOOD  PROTIME-INR  TROPONIN I  TYPE AND SCREEN  PREPARE RBC (CROSSMATCH)    EKG EKG Interpretation  Date/Time:  Friday August 01 2017 10:42:09 EDT Ventricular Rate:  119 PR Interval:    QRS  Duration: 77 QT Interval:  332 QTC Calculation: 468 R Axis:   48 Text Interpretation:  Sinus tachycardia RSR' in V1 or V2, probably normal variant When compared with ECG of 07/23/2017 Rate faster Otherwise no significant change Confirmed by Francine Graven [phone]) on 07/11/2017 12:15:57 PM   Radiology  Dg Chest 2 View  Result Date: 08/06/2017 CLINICAL DATA:  Weakness. Patient complaining of abdominal pain starting this morning. States she has not had a bowel movement x 1 week. Per family, patient had syncopal episode at home this morning.HISTORY OF CANCER, HTN, SICKLE CELL TRAIT, DEPRESSION EXAM: CHEST - 2 VIEW COMPARISON:  Chest CT, 07/22/2017 FINDINGS: Left upper lobe mass is stable from the recent chest CT. Remainder of the lungs is clear. No pleural effusion or pneumothorax. Cardiac silhouette is normal in size. No mediastinal or hilar masses. No evidence of adenopathy. No acute skeletal abnormality.  No bone lesion. Stable changes from left breast surgery. IMPRESSION: 1. No acute cardiopulmonary disease. 2. Left upper lobe mass stable from the recent prior CT scan. Electronically Signed   By: Lajean Manes M.D.   On: 07/31/2017 13:59   Ct Abdomen Pelvis W Contrast  Result Date: 07/19/2017 CLINICAL DATA:  Abdominal pain. No bowel movement 1 week. Syncopal episode this morning. EXAM: CT ABDOMEN AND PELVIS WITH CONTRAST TECHNIQUE: Multidetector CT imaging of the abdomen and pelvis was performed using the standard protocol following bolus administration of intravenous contrast. CONTRAST:  173mL ISOVUE-300 IOPAMIDOL (ISOVUE-300) INJECTION 61% COMPARISON:  CT scan July 22, 2017 FINDINGS: Lower chest: Most of the lung nodules seen on the recent chest CT are above the level of today's imaging. However, there are a few scattered nodules in the bases, similar in the interval. No acute infiltrate in the lung bases. Previous left mastectomy. The distal esophagus is normal. The lower chest is otherwise  unremarkable. Hepatobiliary: Low-attenuation adjacent to the falciform ligament is likely focal fatty deposition, unchanged. No suspicious masses in the liver. The patient is status post cholecystectomy. Prominence of the common bile duct is consistent previous cholecystectomy, unchanged. The portal vein is patent. Pancreas: A tiny probable cyst in the pancreatic head measures 4 mm, unchanged. No acute abnormalities in the pancreas. No suspicious masses. Spleen: Normal in size without focal abnormality. Adrenals/Urinary Tract: Adrenal glands are normal. There are multiple cyst and small hyperdense cysts in both kidneys. The largest mass is seen off the lower pole of the right kidney, measuring up to 4.6 cm with an attenuation 23 Hounsfield units and multiple septations. No other suspicious masses in either kidney. Mild symmetric perinephric stranding is symmetric and nonacute. No renal stones, ureteral stones, or stones in the bladder. The bladder is normal in appearance. Stomach/Bowel: The stent distal duodenum is normal. No discrete gastric masses are identified on CT imaging. The recent pathology report indicated a history a gastric mass which is not seen on this study. Again noted is significant wall thickening in the third and fourth portions of the duodenum. There is also wall thickening associated with loops of jejunum. There is a small bowel loop in the pelvis, probably ileum, with significant wall thickening as well. These findings are similar in the interval with no evidence of perforation. There is a large stool ball in the rectum which is new. No definitive rectal wall thickening or perirectal fat stranding. The stool ball measures up to 8 cm in transverse dimension. A few scattered colonic diverticuli are seen without identified diverticulitis. There is moderate fecal loading in the right side of the colon and proximal transverse colon. The appendix is not clearly visualized but there is no secondary  evidence of appendicitis. Vascular/Lymphatic: Atherosclerotic changes are seen in the nonaneurysmal aorta. No adenopathy identified. Reproductive: The patient is status post hysterectomy. There is a small 13 mm cyst in the  right adnexa of doubtful significance. Other: No free air or free fluid.  No other acute abnormalities. Musculoskeletal: Degenerative changes in the hips and spine. No bony metastatic disease noted. IMPRESSION: 1. Large stool ball in the rectum without wall thickening or perirectal fat stranding. This finding is new. 2. Marked wall thickening associated with the duodenum, jejunum, and probably a pelvic loop of ileum. 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 3. There is a mass off the lower pole the right kidney measuring up to 4.6 cm with multiple septations in attenuation of 23 Hounsfield units. Recommend an MRI for complete characterization. If MRI is not pursued, recommend follow-up with CT imaging. 4. Most of the pulmonary nodules seen on recent CT imaging are not included on today's study. However, there are several small nodules in the lung bases which could be metastatic given history. 5. Atherosclerotic changes in the aorta. Electronically Signed   By: Dorise Bullion III M.D   On: 07/16/2017 14:16    Procedures .Critical Care Performed by: Jacqlyn Larsen, PA-C Authorized by: Jacqlyn Larsen, PA-C   Critical care provider statement:    Critical care time (minutes):  50   Critical care start time:  07/11/2017 10:50 AM   Critical care end time:  07/18/2017 11:40 AM   Critical care time was exclusive of:  Separately billable procedures and treating other patients   Critical care was necessary to treat or prevent imminent or life-threatening deterioration of the following conditions:  Circulatory failure and dehydration   Critical care was time spent personally by me on the following activities:  Development of treatment plan with patient or  surrogate, discussions with consultants, evaluation of patient's response to treatment, examination of patient, obtaining history from patient or surrogate, review of old charts, re-evaluation of patient's condition, ordering and review of radiographic studies, ordering and review of laboratory studies and ordering and performing treatments and interventions   (including critical care time)  Medications Ordered in ED Medications  sodium chloride 0.9 % bolus 500 mL (0 mLs Intravenous Stopped 07/16/2017 1225)    Followed by  0.9 %  sodium chloride infusion (1,000 mLs Intravenous New Bag/Given 07/15/2017 1155)  iopamidol (ISOVUE-300) 61 % injection 100 mL (100 mLs Intravenous Contrast Given 08/04/2017 1312)  0.9 %  sodium chloride infusion (Manually program via Guardrails IV Fluids) ( Intravenous New Bag/Given 08/05/2017 1529)  acetaminophen (TYLENOL) tablet 1,000 mg (1,000 mg Oral Given 08/03/2017 1656)     Initial Impression / Assessment and Plan / ED Course  I have reviewed the triage vital signs and the nursing notes.  Pertinent labs & imaging results that were available during my care of the patient were reviewed by me and considered in my medical decision making (see chart for details).  Patient presents for rectal pain and constipation, recent admission for weakness where she was found to have pulmonary, neck and pharyngeal masses as well as duodenal mass with pending biopsy.  Had melanotic stools at that time but was discharged with stable hemoglobin.  On exam patient has minimal lower abdominal tenderness, appears weak and somewhat cachectic and chronically ill, initially tachycardic but vitals otherwise stable, afebrile.  Will get abdominal labs, lactic acid and CT scan.  Rectal exam revealed melanotic stools, with large hard rectal stool present in the vault, patient had severe pain with rectal exam and did not tolerate disimpaction.  Given melena type and screen, PT/INR added.  Labs significant for  hemoglobin of 7.3, which  is a three-point drop in the last week.  Patient also has a leukocytosis of 24.0 with left shift.  Awaiting urinalysis as well as chest x-ray in addition to abdominal CT to look for any source of infection, patient remains afebrile with improving tachycardia with IV fluids.  No acute electrolyte derangements, normal renal and liver function.  Initial lactic acid is 3.0, without obvious infectious source at this time I favor this being due to more so to dehydration.  PT/INR unremarkable.  Urinalysis obtained via I&O cath, no signs of infection, no hematuria.  Chest x-ray shows stable left upper lobe mass with no urinary disease.  CT shows large stool ball in the rectum, no evidence of thickening or perirectal fat stranding, this most certainly can account for patient's rectal pain.  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  There is a new mass noted in the lower pole of the right kidney measuring approximately 4.6 cm, MRI is recommended for further characterization, but not feel that this will acutely change management at this time.  Several small pulmonary nodules in the lung bases which could represent metastatic disease given history.   Given acute drop in hemoglobin 1 unit PRBCs transfused in the ED.  Spoke with Dr. Oneida Alar with GI regarding case and recent biopsy, who feels patient may need surgical intervention, Dr. Oneida Alar discussed the case with general surgery who feels patient would warrant transfer to [facility], as she may benefit from interventional radiology, and is scheduled to have a laryngeal biopsy as well.  We will consult hospitalist for admission.  Had long discussion with family regarding results and plan to admit to [facility], sons and patient are in agreement.  Patient has been able to have 2 bowel movements here in the ED and is now having some relief in her rectal  pain.  Spoke with Dr. Crisoforo Oxford with [facility] hospitalist who will see and admit the patient for transfer to [facility].  Case discussed with Dr. Thurnell Garbe who assisted in guiding patient's care and is in agreement with plan.  Final Clinical Impressions(s) / ED Diagnoses   Final diagnoses:  Gastrointestinal hemorrhage with melena  Anemia due to GI blood loss  Small cell carcinoma [facility])    ED Discharge Orders    None       Jacqlyn Larsen, [place] 07/19/2017 1719    Francine Graven, DO 08/03/17 0921    Jacqlyn Larsen, PA-C 08/06/17 [place], DO 08/06/17 1240

## 2017-08-01 NOTE — ED Notes (Signed)
Pt manuel bp.   122/44.

## 2017-08-01 NOTE — ED Notes (Addendum)
Patient requests something for pain.

## 2017-08-01 NOTE — ED Notes (Signed)
Pt bladder scanned. Approx. 335ml of urine in bladder.

## 2017-08-01 NOTE — H&P (Signed)
History and Physical    Casey Lopez YOV:[id number] DOB: [birth year] DOA: 07/31/2017  PCP: [name], MD  Patient coming from: home  I have personally briefly reviewed patient's old medical records in [place]  Chief Complaint: rectal pain  HPI: Casey Lopez is a 77 y.o. female with medical history significant of HTN,  NIDDM, hypothyroidism, who was hospitalized recently from 7/16-7/20 due to melena, anemia due to UGIB requiring 2 units PRBC, s/p EGD with biopsy of duodenum mass, also pt was found to have left upper lung mass and several lung nodules as well as bulky nasopharyngeal mass and enlarged retropharyngeal Ln on the right; bilateral cervical Ln. Pt was discharged home on 7/20. That was the last time she ever had a melena and since then no BMs until now. She p/w severe rectal pain due to severe constipation and profound weakness. She also felt herself dehydrated. No abdominal pain and stated she did not use narcotics since her discharge. Of note, the pathology report for her duodenal biopsy revealed small cell lung cancer cells.     ED Course: pt is found to be afebrile but tachycardia with HR in low 110's, normal BP and normal O2 sat. Hb dropped to 7.3 which was 10.2 upon recent discharge. Wbc 24 from 15.8 one week ago. Lactic acid 3 after IVF, now came down to 2.2. CXR showed stable left upper lung mass. CT abd/pelvis sowed large stool ball in the rectum without wall thickening or perirectal fat stranding. This finding is new; Marked wall thickening associated with the duodenum, jejunum, and probably a pelvic loop of ileum. Recent biopsy demonstrated small cell malignancy, likely of pulmonary origin; There is a mass off the lower pole the right kidney measuring up to 4.6 cm with multiple septations in attenuation of 23 Hounsfield units. Recommend an MRI for complete characterization. If MRI is not pursued, recommend follow-up with CT imaging. Most of the pulmonary nodules  seen on recent CT imaging are not included on today's study. However, there are several small nodules in the lung bases which could be metastatic given history. [facility] is consulted to admit this patient. After GI discussed the case with general surgery, felt this patient case is rather complicated and need to be transferred to [facility] for further intervention.    Review of Systems: As per HPI otherwise 10 point review of systems negative.    Past Medical History:  Diagnosis Date  . Cancer ([place])    left breast  . Depression 12/24/2010  . Goiter   . HTN (hypertension) 12/24/2010  . Hypothyroidism 12/24/2010  . Infiltrating ductal carcinoma of breast, stage 1 ([place]) 12/24/2010  . Sickle cell trait [facility])     Past Surgical History:  Procedure Laterality Date  . ABDOMINAL HYSTERECTOMY    . APPENDECTOMY    . BIOPSY  07/24/2017   Procedure: BIOPSY;  Surgeon: Daneil Dolin, MD;  Location: [facility] ENDO [facility];  Service: Endoscopy;;  duodenal biopsy  . BIOPSY THYROID    . CATARACT EXTRACTION     right eye with lens implant  . CATARACT EXTRACTION  2008   right  . CATARACT EXTRACTION W/PHACO  04/16/2011   Procedure: CATARACT EXTRACTION PHACO AND INTRAOCULAR LENS PLACEMENT (IOC);  Surgeon: Elta Guadeloupe T. Gershon Crane, MD;  Location: [facility] ORS;  Service: Ophthalmology;  Laterality: Left;  CDE=7.66  . CHOLECYSTECTOMY    . COLON RESECTION  1965   tumor 4X4 inch per patient. ?colon or other per patient  .  COLONOSCOPY N/A 08/12/2014   Procedure: COLONOSCOPY;  Surgeon: Danie Binder, MD;  Location: [facility];  Service: Endoscopy;  Laterality: N/A;  1300  . DENTAL SURGERY  11/2011  . ESOPHAGOGASTRODUODENOSCOPY (EGD) WITH PROPOFOL N/A 07/24/2017   Procedure: ESOPHAGOGASTRODUODENOSCOPY (EGD) WITH PROPOFOL;  Surgeon: Daneil Dolin, MD;  Location: [facility];  Service: Endoscopy;  Laterality: N/A;  . HEMORRHOID SURGERY    . MASTECTOMY  2009   left  . TONSILECTOMY, ADENOIDECTOMY, BILATERAL MYRINGOTOMY  AND TUBES       reports that she has never smoked. She has never used smokeless tobacco. She reports that she does not drink alcohol or use drugs.  Allergies  Allergen Reactions  . Sulfa Antibiotics Swelling  . Feldene [Piroxicam]   . Penicillins Rash    Has patient had a PCN reaction causing immediate rash, facial/tongue/throat swelling, SOB or lightheadedness with hypotension: No Has patient had a PCN reaction causing severe rash involving mucus membranes or skin necrosis: No Has patient had a PCN reaction that required hospitalization: No Has patient had a PCN reaction occurring within the last 10 years: No If Lopez of the above answers are "NO", then may proceed with Cephalosporin use.     Family History  Problem Relation Age of Onset  . Breast cancer Sister   . Cancer Mother   . Sickle cell anemia Father   . Anesthesia problems Neg Hx   . Hypotension Neg Hx   . Malignant hyperthermia Neg Hx   . Pseudochol deficiency Neg Hx       Prior to Admission medications   Medication Sig Start Date End Date Taking? Authorizing Provider  ALPRAZolam Duanne Moron) 0.5 MG tablet Take 0.5-1 tablets (0.25-0.5 mg total) by mouth 2 (two) times daily as needed for anxiety. 11/01/16  Yes Holley Bouche, NP  Cholecalciferol (VITAMIN D PO) Take 1 capsule by mouth daily.   Yes [provider]  Fexofenadine-Pseudoephedrine (ALLEGRA-D 12 HOUR PO) Take 1 tablet by mouth as needed.   Yes [provider]  K-DUR 20 MEQ tablet TAKE (1) TABLET BY MOUTH ONCE DAILY. 08/14/11  Yes Baird Cancer, PA-C  metFORMIN (GLUCOPHAGE-XR) 500 MG 24 hr tablet Take 500 mg by mouth every evening.  06/27/17  Yes [provider]  pantoprazole (PROTONIX) 40 MG tablet Take 1 tablet (40 mg total) by mouth daily. 07/25/17  Yes Tat, Shanon Brow, MD  simvastatin (ZOCOR) 40 MG tablet Take 40 mg by mouth at bedtime.  12/16/11  Yes [provider]  HYDROcodone-acetaminophen (NORCO/VICODIN) 5-325 MG tablet  Take 1 tablet by mouth every 4 (four) hours as needed for moderate pain. 07/25/17   Orson Eva, MD    Physical Exam: Vitals:   07/29/2017 1532 08/04/2017 1532 07/30/2017 1537 07/11/2017 1548  BP: (!) 127/55 (!) 122/43  (!) 136/57  Pulse:   (!) 122   Resp: (!) 23 (!) 27 (!) 21 (!) 23  Temp:  98.6 F (37 C)  98.7 F (37.1 C)  TempSrc:  Oral  Oral  SpO2:  96% 100% 100%  Weight:      Height:        Constitutional: NAD, calm, comfortable Vitals:   07/20/2017 1532 07/30/2017 1532 07/31/2017 1537 07/17/2017 1548  BP: (!) 127/55 (!) 122/43  (!) 136/57  Pulse:   (!) 122   Resp: (!) 23 (!) 27 (!) 21 (!) 23  Temp:  98.6 F (37 C)  98.7 F (37.1 C)  TempSrc:  Oral  Oral  SpO2:  96% 100% 100%  Weight:      Height:      General: sick appearing elderly female, also looks very dehydrated and weak Eyes: PERRL, conjunctival pallor ENMT: Mucous membranes are extremely dry.  Neck: right sided cervical lymphadenopathy Respiratory: decreased breath sound of left upper lobe, no wheezing, no crackles. Normal respiratory effort. No accessory muscle use.  Cardiovascular: tachycardia, Regular rhythm, no murmurs / rubs / gallops. No extremity edema. 2+ pedal pulses. No carotid bruits.  Abdomen: no tenderness, no masses palpated. No hepatosplenomegaly. Bowel sounds positive.  Musculoskeletal: no clubbing / cyanosis. No joint deformity upper and lower extremities.  Skin: no rashes, lesions, ulcers. Dry skin Neurologic: CN 2-12 grossly intact. Sensation intact, DTR normal. Strength 5/5 in Lopez 4.  Psychiatric: Normal judgment and insight. Alert and oriented x 3. Normal mood.    Labs on Admission: I have personally reviewed following labs and imaging studies  CBC: Recent Labs  Lab 07/29/2017 1115  WBC 24.0*  NEUTROABS 21.4*  HGB 7.3*  HCT 23.4*  MCV 94.7  PLT 563   Basic Metabolic Panel: Recent Labs  Lab 08/05/2017 1115  NA 140  K 4.7  CL 100  CO2 31  GLUCOSE 160*  BUN 20  CREATININE 0.90  CALCIUM 8.2*    GFR: Estimated Creatinine Clearance: 50.9 mL/min (by C-G formula based on SCr of 0.9 mg/dL). Liver Function Tests: Recent Labs  Lab 07/15/2017 1115  AST 22  ALT 14  ALKPHOS 52  BILITOT 0.4  PROT 5.1*  ALBUMIN 2.1*   Recent Labs  Lab 07/31/2017 1115  LIPASE 34   No results for input(s): AMMONIA in the last 168 hours. Coagulation Profile: Recent Labs  Lab 07/20/2017 1151  INR 1.08   Cardiac Enzymes: Recent Labs  Lab 08/04/2017 1115  TROPONINI <0.03   BNP (last 3 results) No results for input(s): PROBNP in the last 8760 hours. HbA1C: No results for input(s): HGBA1C in the last 72 hours. CBG: No results for input(s): GLUCAP in the last 168 hours. Lipid Profile: No results for input(s): CHOL, HDL, LDLCALC, TRIG, CHOLHDL, LDLDIRECT in the last 72 hours. Thyroid Function Tests: No results for input(s): TSH, T4TOTAL, FREET4, T3FREE, THYROIDAB in the last 72 hours. Anemia Panel: No results for input(s): VITAMINB12, FOLATE, FERRITIN, TIBC, IRON, RETICCTPCT in the last 72 hours. Urine analysis:    Component Value Date/Time   COLORURINE STRAW (A) 07/31/2017 1440   APPEARANCEUR CLEAR 08/03/2017 1440   LABSPEC 1.026 07/11/2017 1440   PHURINE 7.0 07/26/2017 1440   GLUCOSEU NEGATIVE 07/22/2017 1440   HGBUR NEGATIVE 07/18/2017 1440   BILIRUBINUR NEGATIVE 07/20/2017 1440   KETONESUR NEGATIVE 07/10/2017 1440   PROTEINUR NEGATIVE 07/21/2017 1440   NITRITE NEGATIVE 07/16/2017 1440   LEUKOCYTESUR NEGATIVE 07/25/2017 1440    Radiological Exams on Admission: Dg Chest 2 View  Result Date: 07/18/2017 CLINICAL DATA:  Weakness. Patient complaining of abdominal pain starting this morning. States she has not had a bowel movement x 1 week. Per family, patient had syncopal episode at home this morning.HISTORY OF CANCER, HTN, SICKLE CELL TRAIT, DEPRESSION EXAM: CHEST - 2 VIEW COMPARISON:  Chest CT, 07/22/2017 FINDINGS: Left upper lobe mass is stable from the recent chest CT. Remainder of the  lungs is clear. No pleural effusion or pneumothorax. Cardiac silhouette is normal in size. No mediastinal or hilar masses. No evidence of adenopathy. No acute skeletal abnormality.  No bone lesion. Stable changes from left breast surgery. IMPRESSION: 1. No acute cardiopulmonary disease.  2. Left upper lobe mass stable from the recent prior CT scan. Electronically Signed   By: Lajean Manes M.D.   On: 07/30/2017 13:59   Ct Abdomen Pelvis W Contrast  Result Date: 07/25/2017 CLINICAL DATA:  Abdominal pain. No bowel movement 1 week. Syncopal episode this morning. EXAM: CT ABDOMEN AND PELVIS WITH CONTRAST TECHNIQUE: Multidetector CT imaging of the abdomen and pelvis was performed using the standard protocol following bolus administration of intravenous contrast. CONTRAST:  147mL ISOVUE-300 IOPAMIDOL (ISOVUE-300) INJECTION 61% COMPARISON:  CT scan July 22, 2017 FINDINGS: Lower chest: Most of the lung nodules seen on the recent chest CT are above the level of today's imaging. However, there are a few scattered nodules in the bases, similar in the interval. No acute infiltrate in the lung bases. Previous left mastectomy. The distal esophagus is normal. The lower chest is otherwise unremarkable. Hepatobiliary: Low-attenuation adjacent to the falciform ligament is likely focal fatty deposition, unchanged. No suspicious masses in the liver. The patient is status post cholecystectomy. Prominence of the common bile duct is consistent previous cholecystectomy, unchanged. The portal vein is patent. Pancreas: A tiny probable cyst in the pancreatic head measures 4 mm, unchanged. No acute abnormalities in the pancreas. No suspicious masses. Spleen: Normal in size without focal abnormality. Adrenals/Urinary Tract: Adrenal glands are normal. There are multiple cyst and small hyperdense cysts in both kidneys. The largest mass is seen off the lower pole of the right kidney, measuring up to 4.6 cm with an attenuation 23 Hounsfield  units and multiple septations. No other suspicious masses in either kidney. Mild symmetric perinephric stranding is symmetric and nonacute. No renal stones, ureteral stones, or stones in the bladder. The bladder is normal in appearance. Stomach/Bowel: The stent distal duodenum is normal. No discrete gastric masses are identified on CT imaging. The recent pathology report indicated a history a gastric mass which is not seen on this study. Again noted is significant wall thickening in the third and fourth portions of the duodenum. There is also wall thickening associated with loops of jejunum. There is a small bowel loop in the pelvis, probably ileum, with significant wall thickening as well. These findings are similar in the interval with no evidence of perforation. There is a large stool ball in the rectum which is new. No definitive rectal wall thickening or perirectal fat stranding. The stool ball measures up to 8 cm in transverse dimension. A few scattered colonic diverticuli are seen without identified diverticulitis. There is moderate fecal loading in the right side of the colon and proximal transverse colon. The appendix is not clearly visualized but there is no secondary evidence of appendicitis. Vascular/Lymphatic: Atherosclerotic changes are seen in the nonaneurysmal aorta. No adenopathy identified. Reproductive: The patient is status post hysterectomy. There is a small 13 mm cyst in the right adnexa of doubtful significance. Other: No free air or free fluid.  No other acute abnormalities. Musculoskeletal: Degenerative changes in the hips and spine. No bony metastatic disease noted. IMPRESSION: 1. Large stool ball in the rectum without wall thickening or perirectal fat stranding. This finding is new. 2. Marked wall thickening associated with the duodenum, jejunum, and probably a pelvic loop of ileum. 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 3. There is a mass off the  lower pole the right kidney measuring up to 4.6 cm with multiple septations in attenuation of 23 Hounsfield units. Recommend an MRI for complete characterization. If MRI is not pursued, recommend follow-up with CT  imaging. 4. Most of the pulmonary nodules seen on recent CT imaging are not included on today's study. However, there are several small nodules in the lung bases which could be metastatic given history. 5. Atherosclerotic changes in the aorta. Electronically Signed   By: Dorise Bullion III M.D   On: 07/18/2017 14:16    EKG: Independently reviewed.   Assessment/Plan Principal Problem:   Melena Active Problems:   Hypothyroidism   HTN (hypertension)   Metastatic cancer ([facility])   Nasopharyngeal mass   Small cell lung cancer (HCC)   Anemia due to GI blood loss   High serum lactic acid   Leukocytosis   Constipation   Right kidney mass   DM (diabetes mellitus), type 2 ([facility])   Weakness generalized   Dehydration    Plan: -admit to [facility] telemetry monitoring per gastroenterologist request (sign out to Dr. [place] Crane) -2 units PRBC for her anemia due to GI blood loss, melena, CBC in am -PPI drip given duodenal biopsy 8 days ago -appreciate GI and general surgery input to stop GIB -avoid narcotics as much as possible due to severe constipation, add Senokot-S, and multiple laxatives. Will also give a dose of Relistor if Lopez above measures yield no BMs -although there's leukocytosis and lactic acid elevation, pt is very dehydrated and she's on metformin, no overt signs of infection anywhere, suspecting leukocytosis is due to dehydration and cancer related; lactic acidosis is due to dehydration and metformin use. 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 Prognosis is guarded. Deferred to GI, oncology and palliative care regarding the further care plan. Pt clearly stated  she's DNR. Please consult oncology when pt [facility] at [facility].  -new renal mass found on CT, however, no plan to check MRI now, as not sure how this will change the management and prognosis. Defer to oncology when consulted at [facility]  DVT prophylaxis: SCD  Code Status: DNR  Family Communication: sons at bedside  Disposition Plan: to be determined  Consults called: GI Dr. Eden Lathe  Admission status: telemetry    Paticia Stack MD [facility] Pager [phone]  If 7PM-7AM, please contact night-coverage [email or web address] Password [facility]  07/19/2017, 4:26 PM

## 2017-08-01 NOTE — ED Triage Notes (Addendum)
Patient complaining of abdominal pain starting this morning. States she has not had a bowel movement x 1 week. Per family, patient had syncopal episode at home this morning.

## 2017-08-01 NOTE — Progress Notes (Signed)
Pt received to 3e17 via stertcher, 1 unit of PRBC has just infused, pt assisted to bed

## 2017-08-01 NOTE — Progress Notes (Signed)
Pt c/o abdominal pain . "its gas. Help me get rid of it" pt assisted to bedside commode + flautlence and belching, paging md on call to advise

## 2017-08-02 ENCOUNTER — Inpatient Hospital Stay (HOSPITAL_COMMUNITY): Payer: Medicare Other

## 2017-08-02 DIAGNOSIS — K5900 Constipation, unspecified: Secondary | ICD-10-CM

## 2017-08-02 DIAGNOSIS — R109 Unspecified abdominal pain: Secondary | ICD-10-CM

## 2017-08-02 DIAGNOSIS — Z853 Personal history of malignant neoplasm of breast: Secondary | ICD-10-CM

## 2017-08-02 DIAGNOSIS — K922 Gastrointestinal hemorrhage, unspecified: Secondary | ICD-10-CM

## 2017-08-02 DIAGNOSIS — C3412 Malignant neoplasm of upper lobe, left bronchus or lung: Secondary | ICD-10-CM

## 2017-08-02 DIAGNOSIS — K921 Melena: Secondary | ICD-10-CM

## 2017-08-02 DIAGNOSIS — Z9012 Acquired absence of left breast and nipple: Secondary | ICD-10-CM

## 2017-08-02 LAB — GLUCOSE, CAPILLARY: Glucose-Capillary: 153 mg/dL — ABNORMAL HIGH (ref 70–99)

## 2017-08-02 LAB — CBC
HCT: 37 % (ref 36.0–46.0)
HCT: 35 % — ABNORMAL LOW (ref 36.0–46.0)
Hemoglobin: 12.3 g/dL (ref 12.0–15.0)
Hemoglobin: 10.8 g/dL — ABNORMAL LOW (ref 12.0–15.0)
MCH: 29.6 pg (ref 26.0–34.0)
MCH: 29.8 pg (ref 26.0–34.0)
MCHC: 33.2 g/dL (ref 30.0–36.0)
MCHC: 30.9 g/dL (ref 30.0–36.0)
MCV: 88.9 fL (ref 78.0–100.0)
MCV: 96.7 fL (ref 78.0–100.0)
Platelets: 359 10*3/uL (ref 150–400)
Platelets: 219 10*3/uL (ref 150–400)
RBC: 4.16 MIL/uL (ref 3.87–5.11)
RBC: 3.62 MIL/uL — ABNORMAL LOW (ref 3.87–5.11)
RDW: 15.4 % (ref 11.5–15.5)
RDW: 16.3 % — ABNORMAL HIGH (ref 11.5–15.5)
WBC: 9.6 10*3/uL (ref 4.0–10.5)
WBC: 13.7 10*3/uL — ABNORMAL HIGH (ref 4.0–10.5)

## 2017-08-02 LAB — BASIC METABOLIC PANEL
Anion gap: 11 (ref 5–15)
BUN: 17 mg/dL (ref 8–23)
CALCIUM: 8.2 mg/dL — AB (ref 8.9–10.3)
CHLORIDE: 104 mmol/L (ref 98–111)
CO2: 25 mmol/L (ref 22–32)
CREATININE: 1.06 mg/dL — AB (ref 0.44–1.00)
GFR calc non Af Amer: 49 mL/min — ABNORMAL LOW (ref >60)
GFR, EST AFRICAN AMERICAN: 57 mL/min — AB (ref >60)
GLUCOSE: 185 mg/dL — AB (ref 70–99)
Potassium: 4.2 mmol/L (ref 3.5–5.1)
Sodium: 140 mmol/L (ref 135–145)

## 2017-08-02 LAB — BPAM RBC
Blood Product Expiration Date: 201908272359
ISSUE DATE / TIME: 201907262159
Unit Type and Rh: 5100

## 2017-08-02 LAB — TYPE AND SCREEN
ABO/RH(D): O POS
Antibody Screen: NEGATIVE
Unit division: 0

## 2017-08-02 LAB — LACTIC ACID, PLASMA
LACTIC ACID, VENOUS: 1.7 mmol/L (ref 0.5–1.9)
Lactic Acid, Venous: 10.4 mmol/L (ref 0.5–1.9)

## 2017-08-02 LAB — URINE CULTURE: Culture: NO GROWTH

## 2017-08-02 LAB — ABO/RH: ABO/RH(D): O POS

## 2017-08-02 MED ORDER — GLYCOPYRROLATE 0.2 MG/ML IJ SOLN
0.2000 mg | INTRAMUSCULAR | Status: DC | PRN
  Filled 2017-08-02 (×2): qty 1

## 2017-08-02 MED ORDER — IPRATROPIUM-ALBUTEROL 0.5-2.5 (3) MG/3ML IN SOLN: 3.0000 mL | RESPIRATORY_TRACT | Status: DC | PRN

## 2017-08-02 MED ORDER — MORPHINE SULFATE (PF) 2 MG/ML IV SOLN
2.0000 mg | INTRAVENOUS | Status: DC | PRN
  Administered 2017-08-02 (×2): 2 mg via INTRAVENOUS
  Filled 2017-08-02 (×2): qty 1

## 2017-08-02 MED ORDER — NALOXONE HCL 0.4 MG/ML IJ SOLN: 0.4000 mg | INTRAMUSCULAR | Status: DC | PRN

## 2017-08-02 MED ORDER — MORPHINE SULFATE (PF) 4 MG/ML IV SOLN
2.5000 mg | INTRAVENOUS | Status: DC | PRN
  Administered 2017-08-02: 1 mg via INTRAVENOUS
  Filled 2017-08-02: qty 1

## 2017-08-02 MED ORDER — SODIUM CHLORIDE 0.9 % IV SOLN: INTRAVENOUS | Status: DC

## 2017-08-05 ENCOUNTER — Other Ambulatory Visit (HOSPITAL_COMMUNITY): Payer: Medicare Other

## 2017-08-05 LAB — TYPE AND SCREEN
ABO/RH(D): O POS
Antibody Screen: NEGATIVE
Unit division: 0
Unit division: 0

## 2017-08-05 LAB — BPAM RBC
BLOOD PRODUCT EXPIRATION DATE: 201908182359
Blood Product Expiration Date: 201908182359
ISSUE DATE / TIME: 201907261517
UNIT TYPE AND RH: 5100
UNIT TYPE AND RH: 5100

## 2017-08-06 ENCOUNTER — Ambulatory Visit (HOSPITAL_COMMUNITY): Admission: RE | Admit: 2017-08-06 | Payer: Medicare Other | Source: Ambulatory Visit | Admitting: Otolaryngology

## 2017-08-06 SURGERY — BIOPSY, NASOPHARYNX
Anesthesia: General

## 2017-08-07 NOTE — Progress Notes (Signed)
Paged by bedside RN regarding an acute drop in the patients BP and possible syncopal episode while attempting to reach the bedside commode.  Patient was recently admitted for metastatic small cell lung cancer with mets to the GI tract.  Abdominal x-ray also revealed a possible ileus and several attempts had been made to place an NG tube which were unsuccessful.  Upon my arrival to the room two bedside RNs were present as well as the rapid response nurse, and the patient's [name]. Patient was tachypneic with a respiratory rate in the 40s and hypotensive with a SBP in the 80s. She was also having brown-bile colored secretions while coughing.  Patient was complaining of pain in her abdomen which is been unrelieved by morphine and also complaining of generalized pain and fatigue.  Spoke with the patient and [name] regarding the patient's diagnosis and poor prognosis.  Patient was alert and involved in the discussion at this time.  Also discussed the patient's options of care and the decision was made to pursue comfort measures at this point.  The [name] was agreeable to this decision as well.  Cardiac monitoring was discontinued as well as all lab work. Added Robinul 0.2 mg every 4 hours PRN for secretions and we will continue the morphine 2.5 mg every 2 hours as needed.   Arby Barrette AGPCNP-BC, AGNP-C [facility] Pager [phone]

## 2017-08-07 NOTE — Progress Notes (Signed)
MD ordered RN to pronounce pt's death, which was 21:15, thanks Arvella Nigh RN.

## 2017-08-07 NOTE — Consult Note (Signed)
[facility] CONSULT Reason for consult: Hematemesis, abdominal pain Referring Physician: [facility] hospitalist.  Primary GI: Dr. Sydell Axon in [place] is an 77 y.o. female.  HPI: She gets her health care in [place].  Had colonoscopy by Dr. feels 3 years ago with adenomatous and hyperplastic polyps removed.  Was admitted in [place] and extensive work-up there showed diffusely metastatic small cell carcinoma after presenting with weakness and swelling of her neck and a 5 cm mass in the upper lobe with marked small bowel thickening in the duodenum.  She was seen by Dr. Sydell Axon underwent EGD 1 week ago and in the antral area was a large fungating ulcerated mass with biopsies returning showing small cell carcinoma.  She also has had rectal pain and has not had bowel movements.  She appears to have carcinoma throughout her small bowel and has been seen by oncology.  CT showed fairly significant fecal impaction.  She is continuing to complain of pain and we are asked to see her to see if she needed another endoscopy.  The patient's most recent hemoglobin is 12.2.  She is apparently not bleeding but is complaining of pain and has had recent hematemesis.  Past Medical History:  Diagnosis Date  . Cancer ([place])    left breast  . Depression 12/24/2010  . Goiter   . HTN (hypertension) 12/24/2010  . Hypothyroidism 12/24/2010  . 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 Sickle cell trait [facility])     Past Surgical History:  Procedure Laterality Date  . ABDOMINAL HYSTERECTOMY    . APPENDECTOMY    . BIOPSY  07/24/2017   Procedure: BIOPSY;  Surgeon: Daneil Dolin, MD;  Location: [facility];  Service: Endoscopy;;  duodenal biopsy  . BIOPSY THYROID    . CATARACT EXTRACTION     right eye with lens implant  . CATARACT EXTRACTION  2008   right  . CATARACT EXTRACTION W/PHACO  04/16/2011   Procedure: CATARACT EXTRACTION PHACO AND INTRAOCULAR LENS PLACEMENT  (IOC);  Surgeon: Elta Guadeloupe T. Gershon Crane, MD;  Location: [facility] ORS;  Service: Ophthalmology;  Laterality: Left;  CDE=7.66  . CHOLECYSTECTOMY    . COLON RESECTION  1965   tumor 4X4 inch per patient. ?colon or other per patient  . COLONOSCOPY N/A 08/12/2014   Procedure: COLONOSCOPY;  Surgeon: Danie Binder, MD;  Location: [facility];  Service: Endoscopy;  Laterality: N/A;  1300  . DENTAL SURGERY  11/2011  . ESOPHAGOGASTRODUODENOSCOPY (EGD) WITH PROPOFOL N/A 07/24/2017   Procedure: ESOPHAGOGASTRODUODENOSCOPY (EGD) WITH PROPOFOL;  Surgeon: Daneil Dolin, MD;  Location: [facility];  Service: Endoscopy;  Laterality: N/A;  . HEMORRHOID SURGERY    . MASTECTOMY  2009   left  . TONSILECTOMY, ADENOIDECTOMY, BILATERAL MYRINGOTOMY AND TUBES      Family History  Problem Relation Age of Onset  . Breast cancer Sister   . Cancer Mother   . Sickle cell anemia Father   . Anesthesia problems Neg Hx   . Hypotension Neg Hx   . Malignant hyperthermia Neg Hx   . Pseudochol deficiency Neg Hx     Social History:  reports that she has never smoked. She has never used smokeless tobacco. She reports that she does not drink alcohol or use drugs.  Allergies:  Allergies  Allergen Reactions  . Sulfa Antibiotics Swelling  . Feldene [Piroxicam]   . Penicillins Rash    Has patient had a PCN reaction causing immediate rash,  facial/tongue/throat swelling, SOB or lightheadedness with hypotension: No Has patient had a PCN reaction causing severe rash involving mucus membranes or skin necrosis: No Has patient had a PCN reaction that required hospitalization: No Has patient had a PCN reaction occurring within the last 10 years: No If all of the above answers are "NO", then may proceed with Cephalosporin use.     Medications; Prior to Admission medications   Medication Sig Start Date End Date Taking? Authorizing Provider  ALPRAZolam Duanne Moron) 0.5 MG tablet Take 0.5-1 tablets (0.25-0.5 mg total) by mouth 2 (two) times  daily as needed for anxiety. 11/01/16  Yes Holley Bouche, NP  Cholecalciferol (VITAMIN D PO) Take 1 capsule by mouth daily.   Yes [provider]  Fexofenadine-Pseudoephedrine (ALLEGRA-D 12 HOUR PO) Take 1 tablet by mouth as needed.   Yes [provider]  K-DUR 20 MEQ tablet TAKE (1) TABLET BY MOUTH ONCE DAILY. 08/14/11  Yes Baird Cancer, PA-C  metFORMIN (GLUCOPHAGE-XR) 500 MG 24 hr tablet Take 500 mg by mouth every evening.  06/27/17  Yes [provider]  pantoprazole (PROTONIX) 40 MG tablet Take 1 tablet (40 mg total) by mouth daily. 07/25/17  Yes Tat, Shanon Brow, MD  simvastatin (ZOCOR) 40 MG tablet Take 40 mg by mouth at bedtime.  12/16/11  Yes [provider]  HYDROcodone-acetaminophen (NORCO/VICODIN) 5-325 MG tablet Take 1 tablet by mouth every 4 (four) hours as needed for moderate pain. 07/25/17   Orson Eva, MD   . docusate sodium  100 mg Oral BID  . [START ON 08/05/2017] pantoprazole  40 mg Intravenous Q12H  . senna  1 tablet Oral BID  . simvastatin  40 mg Oral QHS   PRN Meds acetaminophen **OR** acetaminophen, ALPRAZolam, HYDROcodone-acetaminophen, morphine injection, ondansetron **OR** ondansetron (ZOFRAN) IV, prochlorperazine, simethicone, zolpidem Results for orders placed or performed during the hospital encounter of 07/21/2017 (from the past 48 hour(s))  CBC with Differential     Status: Abnormal   Collection Time: 07/29/2017 11:15 AM  Result Value Ref Range   WBC 24.0 (H) 4.0 - 10.5 K/uL   RBC 2.47 (L) 3.87 - 5.11 MIL/uL   Hemoglobin 7.3 (L) 12.0 - 15.0 g/dL   HCT 23.4 (L) 36.0 - 46.0 %   MCV 94.7 78.0 - 100.0 fL   MCH 29.6 26.0 - 34.0 pg   MCHC 31.2 30.0 - 36.0 g/dL   RDW 15.2 11.5 - 15.5 %   Platelets 317 150 - 400 K/uL   Neutrophils Relative % 90 %   Neutro Abs 21.4 (H) 1.7 - 7.7 K/uL   Lymphocytes Relative 7 %   Lymphs Abs 1.7 0.7 - 4.0 K/uL   Monocytes Relative 3 %   Monocytes Absolute 0.8 0.1 - 1.0 K/uL   Eosinophils Relative 0 %    Eosinophils Absolute 0.0 0.0 - 0.7 K/uL   Basophils Relative 0 %   Basophils Absolute 0.0 0.0 - 0.1 K/uL    Comment: Performed at [facility], [address], [place], [place] [address]  Comprehensive metabolic panel     Status: Abnormal   Collection Time: 07/23/2017 11:15 AM  Result Value Ref Range   Sodium 140 135 - 145 mmol/L   Potassium 4.7 3.5 - 5.1 mmol/L   Chloride 100 98 - 111 mmol/L   CO2 31 22 - 32 mmol/L   Glucose, Bld 160 (H) 70 - 99 mg/dL   BUN 20 8 - 23 mg/dL   Creatinine, Ser 0.90 0.44 - 1.00 mg/dL  Calcium 8.2 (L) 8.9 - 10.3 mg/dL   Total Protein 5.1 (L) 6.5 - 8.1 g/dL   Albumin 2.1 (L) 3.5 - 5.0 g/dL   AST 22 15 - 41 U/L   ALT 14 0 - 44 U/L   Alkaline Phosphatase 52 38 - 126 U/L   Total Bilirubin 0.4 0.3 - 1.2 mg/dL   GFR calc non Af Amer >60 >60 mL/min   GFR calc Af Amer >60 >60 mL/min    Comment: (NOTE) The eGFR has been calculated using the CKD EPI equation. This calculation has not been validated in all clinical situations. eGFR's persistently <60 mL/min signify possible Chronic Kidney Disease.    Anion gap 9 5 - 15    Comment: Performed at [facility], [address], [place], [place] [address]  Lipase, blood     Status: None   Collection Time: 07/08/2017 11:15 AM  Result Value Ref Range   Lipase 34 11 - 51 U/L    Comment: Performed at [facility], [address], [place], [place] [address]  Lactic acid, plasma     Status: Abnormal   Collection Time: 08/06/2017 11:15 AM  Result Value Ref Range   Lactic Acid, Venous 3.0 (HH) 0.5 - 1.9 mmol/L    Comment: CRITICAL RESULT CALLED TO, READ BACK BY AND VERIFIED WITH: Schriever B @ 1212 ON [id number] BY HENDERSON L. Performed at [facility], [address], [place], [place] [address]   Troponin I     Status: None   Collection Time: 07/19/2017 11:15 AM  Result Value Ref Range   Troponin I <0.03 <0.03 ng/mL    Comment: Performed at [facility], [address] [place], [place] [address]  Protime-INR     Status:  None   Collection Time: 08/06/2017 11:51 AM  Result Value Ref Range   Prothrombin Time 13.9 11.4 - 15.2 seconds   INR 1.08     Comment: Performed at [facility], [address], [place], [place] [address]  POC occult blood, ED Provider will collect     Status: Abnormal   Collection Time: 07/14/2017 11:57 AM  Result Value Ref Range   Fecal Occult Bld POSITIVE (A) NEGATIVE  Type and screen     Status: None (Preliminary result)   Collection Time: 07/13/2017 12:10 PM  Result Value Ref Range   ABO/RH(D) O POS    Antibody Screen NEG    Sample Expiration 08/04/2017    Unit Number [id number]    Blood Component Type RED CELLS,LR    Unit division 00    Status of Unit ALLOCATED    Transfusion Status OK TO TRANSFUSE    Crossmatch Result Compatible    Unit Number [id number]    Blood Component Type RED CELLS,LR    Unit division 00    Status of Unit ISSUED,FINAL    Transfusion Status OK TO TRANSFUSE    Crossmatch Result      Compatible Performed at [facility], [address] [place], [place] [address]   Lactic acid, plasma     Status: Abnormal   Collection Time: 07/14/2017  1:47 PM  Result Value Ref Range   Lactic Acid, Venous 2.2 (HH) 0.5 - 1.9 mmol/L    Comment: CRITICAL RESULT CALLED TO, READ BACK BY AND VERIFIED WITH: GENTRY R. @ 1444 ON [id number] BY HENDERSON L. Performed at [facility], [address], [place], [place] [address]   Prepare RBC     Status: None   Collection Time: 07/21/2017  2:22 PM  Result Value Ref Range   Order Confirmation      ORDER PROCESSED BY BLOOD BANK Performed at [facility], [address], [place], [place] [address]   Urinalysis, Routine w reflex microscopic     Status: Abnormal   Collection Time: 07/22/2017  2:40 PM  Result Value Ref Range   Color, Urine STRAW (A) YELLOW   APPearance CLEAR CLEAR   Specific Gravity, Urine 1.026 1.005 - 1.030   pH 7.0 5.0 - 8.0   Glucose, UA NEGATIVE NEGATIVE mg/dL   Hgb urine dipstick NEGATIVE NEGATIVE   Bilirubin  Urine NEGATIVE NEGATIVE   Ketones, ur NEGATIVE NEGATIVE mg/dL   Protein, ur NEGATIVE NEGATIVE mg/dL   Nitrite NEGATIVE NEGATIVE   Leukocytes, UA NEGATIVE NEGATIVE    Comment: Performed at [facility], [address] [place], [place] [address]  Urine culture     Status: None   Collection Time: 07/22/2017  2:42 PM  Result Value Ref Range   Specimen Description      URINE, CATHETERIZED Performed at [facility], [address] [place], [place] [address]    Special Requests      NONE Performed at [facility], [address], [place], [place] [address]    Culture      NO GROWTH Performed at [facility], [place] [address], [place], [place] [address]    Report Status 08-03-2017 FINAL   Type and screen [place]     Status: None   Collection Time: 07/31/2017  8:55 PM  Result Value Ref Range   ABO/RH(D) O POS    Antibody Screen NEG    Sample Expiration 08/04/2017    Unit Number [id number]    Blood Component Type RED CELLS,LR    Unit division 00    Status of Unit ISSUED,FINAL    Transfusion Status OK TO TRANSFUSE    Crossmatch Result      Compatible Performed at [facility], [place] [address] [place], [place] [address]   ABO/Rh     Status: None   Collection Time: 07/09/2017  8:55 PM  Result Value Ref Range   ABO/RH(D)      O POS Performed at [facility] [address] [place], [place] [address]   Basic metabolic panel     Status: Abnormal   Collection Time: 03-Aug-2017  5:23 AM  Result Value Ref Range   Sodium 140 135 - 145 mmol/L   Potassium 4.2 3.5 - 5.1 mmol/L   Chloride 104 98 - 111 mmol/L   CO2 25 22 - 32 mmol/L   Glucose, Bld 185 (H) 70 - 99 mg/dL   BUN 17 8 - 23 mg/dL   Creatinine, Ser 1.06 (H) 0.44 - 1.00 mg/dL   Calcium 8.2 (L) 8.9 - 10.3 mg/dL   GFR calc non Af Amer 49 (L) >60 mL/min   GFR calc Af Amer 57 (L) >60 mL/min    Comment: (NOTE) The eGFR has been calculated using the CKD EPI equation. This calculation has not been  validated in all clinical situations. eGFR's persistently <60 mL/min signify possible Chronic Kidney Disease.    Anion gap 11 5 - 15    Comment: Performed at [facility] [address], [place],  [address]  CBC     Status: None   Collection Time: Aug 03, 2017  5:23 AM  Result Value Ref Range   WBC 9.6 4.0 - 10.5 K/uL   RBC  4.16 3.87 - 5.11 MIL/uL   Hemoglobin 12.3 12.0 - 15.0 g/dL   HCT 37.0 36.0 - 46.0 %   MCV 88.9 78.0 - 100.0 fL   MCH 29.6 26.0 - 34.0 pg   MCHC 33.2 30.0 - 36.0 g/dL   RDW 15.4 11.5 - 15.5 %   Platelets 359 150 - 400 K/uL    Comment: Performed at [facility], [place] [address] [place], [place] [address]  Lactic acid, plasma     Status: None   Collection Time: 08-08-17  5:23 AM  Result Value Ref Range   Lactic Acid, Venous 1.7 0.5 - 1.9 mmol/L    Comment: Performed at [facility] [address] [place], [place] [address]    Dg Chest 2 View  Result Date: 07/20/2017 CLINICAL DATA:  Weakness. Patient complaining of abdominal pain starting this morning. States she has not had a bowel movement x 1 week. Per family, patient had syncopal episode at home this morning.HISTORY OF CANCER, HTN, SICKLE CELL TRAIT, DEPRESSION EXAM: CHEST - 2 VIEW COMPARISON:  Chest CT, 07/22/2017 FINDINGS: Left upper lobe mass is stable from the recent chest CT. Remainder of the lungs is clear. No pleural effusion or pneumothorax. Cardiac silhouette is normal in size. No mediastinal or hilar masses. No evidence of adenopathy. No acute skeletal abnormality.  No bone lesion. Stable changes from left breast surgery. IMPRESSION: 1. No acute cardiopulmonary disease. 2. Left upper lobe mass stable from the recent prior CT scan. Electronically Signed   By: Lajean Manes M.D.   On: 07/24/2017 13:59   Ct Abdomen Pelvis W Contrast  Result Date: 07/15/2017 CLINICAL DATA:  Abdominal pain. No bowel movement 1 week. Syncopal episode this morning. EXAM: CT ABDOMEN AND PELVIS WITH  CONTRAST TECHNIQUE: Multidetector CT imaging of the abdomen and pelvis was performed using the standard protocol following bolus administration of intravenous contrast. CONTRAST:  14m ISOVUE-300 IOPAMIDOL (ISOVUE-300) INJECTION 61% COMPARISON:  CT scan July 22, 2017 FINDINGS: Lower chest: Most of the lung nodules seen on the recent chest CT are above the level of today's imaging. However, there are a few scattered nodules in the bases, similar in the interval. No acute infiltrate in the lung bases. Previous left mastectomy. The distal esophagus is normal. The lower chest is otherwise unremarkable. Hepatobiliary: Low-attenuation adjacent to the falciform ligament is likely focal fatty deposition, unchanged. No suspicious masses in the liver. The patient is status post cholecystectomy. Prominence of the common bile duct is consistent previous cholecystectomy, unchanged. The portal vein is patent. Pancreas: A tiny probable cyst in the pancreatic head measures 4 mm, unchanged. No acute abnormalities in the pancreas. No suspicious masses. Spleen: Normal in size without focal abnormality. Adrenals/Urinary Tract: Adrenal glands are normal. There are multiple cyst and small hyperdense cysts in both kidneys. The largest mass is seen off the lower pole of the right kidney, measuring up to 4.6 cm with an attenuation 23 Hounsfield units and multiple septations. No other suspicious masses in either kidney. Mild symmetric perinephric stranding is symmetric and nonacute. No renal stones, ureteral stones, or stones in the bladder. The bladder is normal in appearance. Stomach/Bowel: The stent distal duodenum is normal. No discrete gastric masses are identified on CT imaging. The recent pathology report indicated a history a gastric mass which is not seen on this study. Again noted is significant wall thickening in the third and fourth portions of the duodenum. There is also wall thickening associated with loops of  jejunum. There  is a small bowel loop in the pelvis, probably ileum, with significant wall thickening as well. These findings are similar in the interval with no evidence of perforation. There is a large stool ball in the rectum which is new. No definitive rectal wall thickening or perirectal fat stranding. The stool ball measures up to 8 cm in transverse dimension. A few scattered colonic diverticuli are seen without identified diverticulitis. There is moderate fecal loading in the right side of the colon and proximal transverse colon. The appendix is not clearly visualized but there is no secondary evidence of appendicitis. Vascular/Lymphatic: Atherosclerotic changes are seen in the nonaneurysmal aorta. No adenopathy identified. Reproductive: The patient is status post hysterectomy. There is a small 13 mm cyst in the right adnexa of doubtful significance. Other: No free air or free fluid.  No other acute abnormalities. Musculoskeletal: Degenerative changes in the hips and spine. No bony metastatic disease noted. IMPRESSION: 1. Large stool ball in the rectum without wall thickening or perirectal fat stranding. This finding is new. 2. Marked wall thickening associated with the duodenum, jejunum, and probably a pelvic loop of ileum. 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 3. There is a mass off the lower pole the right kidney measuring up to 4.6 cm with multiple septations in attenuation of 23 Hounsfield units. Recommend an MRI for complete characterization. If MRI is not pursued, recommend follow-up with CT imaging. 4. Most of the pulmonary nodules seen on recent CT imaging are not included on today's study. However, there are several small nodules in the lung bases which could be metastatic given history. 5. Atherosclerotic changes in the aorta. Electronically Signed   By: Dorise Bullion III M.D   On: 07/23/2017 14:16   Dg Abd Portable 1v  Result Date: 08-22-17 CLINICAL DATA:  Vomiting  and abdominal pain tonight EXAM: PORTABLE ABDOMEN - 1 VIEW COMPARISON:  CT abdomen and pelvis 08/06/2017 FINDINGS: Scattered gas and stool in the colon without distention. Mildly dilated gas-filled mid abdominal small bowel. Changes may indicate ileus or enteritis. No radiopaque stones. Surgical clips in the right upper quadrant. Residual contrast material in the bladder. Degenerative changes in the spine and hips. IMPRESSION: Mildly dilated gas-filled mid abdominal small bowel may indicate ileus or enteritis. Previous CT demonstrated areas of bowel wall thickening suspicious for lymphoma or metastasis. Electronically Signed   By: Lucienne Capers M.D.   On: Aug 22, 2017 04:53               Blood pressure 133/63, pulse (!) 145, temperature (!) 97.5 F (36.4 C), temperature source Oral, resp. rate 18, height _0  (1.702 m), weight 69.3 kg (152 lb 12.5 oz), SpO2 97 %.  Physical exam:   General--frail-appearing female who does appear to be in pain ENT--anicteric Neck--supple Heart--tachycardic Lungs--clear Abdomen--markedly distended with bowel sounds present but diffusely tender    Assessment: 1.  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  The patient has an ulcerated mass in the stomach and duodenum biopsy-proven to be small cell carcinoma.  At this point, I do not think another EGD would add anything.  The diagnosis is known and she just had an EGD 1 week ago.  I think symptomatic treatment with pain medications and NG tube pending initiation of chemotherapy would be appropriate 2.  Severe constipation.  Patient does have enemas ordered.  Plan: At this point I do not feel there is anything for [place] to add.  Think  symptomatic treatment and decision about chemotherapy versus palliative care needs to be made.  We will be available if some acute GI problem develops.   Nancy Fetter Aug 05, 2017, 4:34 PM   This note was created using voice recognition software and minor  errors may Have occurred unintentionally. Pager: [phone] If no answer or after hours call [phone]

## 2017-08-07 NOTE — Progress Notes (Signed)
Pts blood was completed upon arrival to unit. Had to edit administration to reflect such.

## 2017-08-07 NOTE — Progress Notes (Signed)
Pt with nause and vomit of brown emesis "im so sick" no other meds available to give for nausea, paged Dr Marthenia Rolling to advise

## 2017-08-07 NOTE — Progress Notes (Signed)
Pt wanted to get to bsc, pt sat up on side of bed and went slumped eyes rolled back into head, pt had blood run out of left nares, pt was attempted with NGT in right narept assisted back to bed, pt is DNR and son says he does not want to change that status, , pt initial sbp 77, pt ivf changed to bolus, bp 85/26, rr 38 with accessory muscles, pt with rhonchi via lungs, paged Dr Marthenia Rolling, orders in epic we discussed narcan is responsive  And most likel dry and bp dropped and hold narcan, rapid response nurse  at bedside, pt rr 40 with accessory mucle, pt more alert with increase in fluids

## 2017-08-07 NOTE — Progress Notes (Addendum)
Pt had lactic acid of 10.4, MD notified and aware, will continue to monitor, Thanks Arvella Nigh RN.

## 2017-08-07 NOTE — Significant Event (Addendum)
Rapid Response Event Note  Overview: Syncope - Hypotension  Initial Focused Assessment:  RR called to [employer], patient had a what appeared to be a syncopal episode. SBP were in the 50s. On presentation, patient was breathing about 40-45 breaths per minute, increased WOB, endorsed severe abdominal pain, which has worsening as the day has gone on. Attempts at NGT were unsuccessful.  Patient has been vomiting feculent brown contents as well. Per the primary RN, patient's abdomen is more swollen now that it was an hour ago. Lung sounds rhonchi in all fields, + use of accessory muscles. SBP has improved into the 80-90s but that if after 500cc of NS bolus. Patient is very cool to touch, extremities are cold, hard to read pulse-oximetry reading (multiple sites tried), HR in the 120-125 range. Lips and fingertips are bluish in nature  Patient appears to be actively dying, patient was given 1mg  Morphine IV for discomfort, SBP did drop to the 80s but remaining 500cc NS was infusing.    I paged [place] NP at and he arrived to the bedside immediately. Patient's [name] was in the room, we discussed the current presentation of the patient with him. Decision was made to make the patient comfortable and intiate comfort measures. Patient was already a DNR, mestatic cancer diagnosis as well, see ONC note.  Interventions: - 1L NS bolus  - LABS - Lactic 10.4  - Morphine 1mg  IV x 1  Plan of Care: - D/C diagnostics, tele, and invasive procedures and initiate comfort measures.  - We informed the son, Casey Lopez, that he can call his family and that they can come and spend some with the patient if they wish. I offered a chaplain to come up as well.   Event Summary:    at    Call Time 1840 - As a [place] [employer] End Time 2005  Casey Lopez R

## 2017-08-07 NOTE — Progress Notes (Signed)
Sister at bedside, asking about poc, pt had sip of clear liquid and nausea, reviewed note and oncology, GI and palliative to be consulted, paged Dr Marthenia Rolling to Arnold[facility] Hooker

## 2017-08-07 NOTE — Progress Notes (Signed)
[name] at bedside and questions about oncologist, reviewed note and per MD he would contact via phone, called and Dr Alen Blew was transferred to pts room to speak to son, pt still having nausea with brown emesis, pt has GI consult ordered, called and left message for oncall to contact Dr Marcie Bal

## 2017-08-07 NOTE — Progress Notes (Addendum)
PROGRESS NOTE    Casey Lopez  RWE:[id number] DOB: [birth year] DOA: 07/20/2017 PCP: [name], MD  Outpatient Specialists:     Brief Narrative:  Patient is a 77 year old African-American female with past medical history significant for HTN,  NIDDM, hypothyroidism, who was hospitalized recently from 7/16-7/20 due to melena, anemia due to UGIB requiring 2 units PRBC, s/p EGD with biopsy of duodenum mass, also pt was found to have left upper lung mass and several lung nodules as well as bulky nasopharyngeal mass and enlarged retropharyngeal Ln on the right; bilateral cervical Ln. Pt was discharged home on 7/20. That was the last time she ever had a melena and since then no BMs until now. She p/w severe rectal pain due to severe constipation and profound weakness. She also felt herself dehydrated. No abdominal pain and stated she did not use narcotics since her discharge. Of note, the pathology report for her duodenal biopsy revealed small cell lung cancer cells.  Abdominal x-ray revealed Mildly dilated gas-filled mid abdominal small bowel may indicate ileus or enteritis. Previous CT demonstrated areas of bowel wall thickening suspicious for lymphoma or metastasis.  Oncology team has been consulted.  GI team also consulted.  Assessment & Plan:   Principal Problem:   Metastatic small cell lung cancer    Active Problems:   Abdominal pain.   Volume depletion    Possible ileus versus enteritis   Nausea and vomiting/possible coffee-ground emesis   Rectal pain   Hypothyroidism   HTN (hypertension)   Metastatic cancer (HCC)   Nasopharyngeal mass   Small cell lung cancer (HCC)   Anemia due to GI blood loss   High serum lactic acid   Leukocytosis   Constipation   Right kidney mass   DM (diabetes mellitus), type 2 (HCC)   Weakness generalized   Dehydration   Metastatic small cell lung cancer, with mets to the GI tract: Oncology team has been consulted. GI team is been consulted. We  will continue to define the goal of care.  Abdominal pain: Abdominal x-ray finding noted.   CT abdomen findings noted. Morphine increased to 2.5 mg IV every 2 hourly as needed. Lactic acid levels start NG tube low suction Repeat abdominal x-ray Further management will depend on above.  Nausea and vomiting: Kindly see above. Change IV fluids to normal saline at the rate of 125 cc an hour NG tube to low suction GI team is been consulted (discussed personally with GI attending, Dr. Percell Miller)  Rectal pain, possible secondary to constipation:  -Discussed with GI attending, enema advised.    Volume depletion: IV fluid as above. Monitor renal function and electrolytes.   Acute blood loss anemia: -Hemoglobin was 10.3 g/dL about a week ago, but said to have dropped to 7.2 g/dL on presentation. -Current hemoglobin is 12.3 g/dL after 2 units of packed red blood cells. -It is possible that the 7.2 g/dL hemoglobin level may not be accurate, however, volume depletion could also lead to falsely elevated hemoglobin of 12.3 g/dL. -Continue to monitor H/H every 8 hours   Prognosis is guarded. Palliative care consult.   DVT prophylaxis: SCD Code Status: DNR Family Communication:  Disposition Plan: This will depend on hospital course   Consultants:   Oncology  GI  Procedures:   For NG tube placement  Antimicrobials:   None   Subjective: Patient continues to report abdominal pain. Nausea vomiting noted  Objective: Vitals:   August 17, 2017 0545 08-17-2017 0700 2017/08/17 0808 08-17-2017 1210  BP: 137/69  121/69 133/63  Pulse: (!) 135  (!) 145 (!) 145  Resp: 14   18  Temp: (!) 97.4 F (36.3 C)  97.8 F (36.6 C) (!) 97.5 F (36.4 C)  TempSrc: Oral  Oral Oral  SpO2: 95%  97% 97%  Weight:  69.3 kg (152 lb 12.5 oz)    Height:        Intake/Output Summary (Last 24 hours) at 08-20-2017 1607 Last data filed at 2017/08/20 1300 Gross per 24 hour  Intake 2552.49 ml  Output 525 ml  Net  2027.49 ml   Filed Weights   07/30/2017 1035 07/30/2017 1844 08-20-2017 0700  Weight: 68.9 kg (152 lb) 68.7 kg (151 lb 7.3 oz) 69.3 kg (152 lb 12.5 oz)    Examination:  General exam: Sedated.  Patient reported 75 pound weight loss.   Respiratory system: Clear to auscultation. Respiratory effort normal. Cardiovascular system: S1 & S2 heard Gastrointestinal system: Abdomen is nondistended, but tender on palpation.   Central nervous system: Alert and oriented. No focal neurological deficits. Extremities: No leg edema    Data Reviewed: I have personally reviewed following labs and imaging studies  CBC: Recent Labs  Lab 07/21/2017 1115 2017/08/20 0523  WBC 24.0* 9.6  NEUTROABS 21.4*  --   HGB 7.3* 12.3  HCT 23.4* 37.0  MCV 94.7 88.9  PLT 317 027   Basic Metabolic Panel: Recent Labs  Lab 08/03/2017 1115 08/20/17 0523  NA 140 140  K 4.7 4.2  CL 100 104  CO2 31 25  GLUCOSE 160* 185*  BUN 20 17  CREATININE 0.90 1.06*  CALCIUM 8.2* 8.2*   GFR: Estimated Creatinine Clearance: 43.2 mL/min (A) (by C-G formula based on SCr of 1.06 mg/dL (H)). Liver Function Tests: Recent Labs  Lab 07/26/2017 1115  AST 22  ALT 14  ALKPHOS 52  BILITOT 0.4  PROT 5.1*  ALBUMIN 2.1*   Recent Labs  Lab 08/03/2017 1115  LIPASE 34   No results for input(s): AMMONIA in the last 168 hours. Coagulation Profile: Recent Labs  Lab 07/29/2017 1151  INR 1.08   Cardiac Enzymes: Recent Labs  Lab 07/20/2017 1115  TROPONINI <0.03   BNP (last 3 results) No results for input(s): PROBNP in the last 8760 hours. HbA1C: No results for input(s): HGBA1C in the last 72 hours. CBG: No results for input(s): GLUCAP in the last 168 hours. Lipid Profile: No results for input(s): CHOL, HDL, LDLCALC, TRIG, CHOLHDL, LDLDIRECT in the last 72 hours. Thyroid Function Tests: No results for input(s): TSH, T4TOTAL, FREET4, T3FREE, THYROIDAB in the last 72 hours. Anemia Panel: No results for input(s): VITAMINB12, FOLATE,  FERRITIN, TIBC, IRON, RETICCTPCT in the last 72 hours. Urine analysis:    Component Value Date/Time   COLORURINE STRAW (A) 07/24/2017 1440   APPEARANCEUR CLEAR 07/12/2017 1440   LABSPEC 1.026 07/12/2017 1440   PHURINE 7.0 07/31/2017 1440   GLUCOSEU NEGATIVE 07/28/2017 1440   HGBUR NEGATIVE 07/19/2017 1440   BILIRUBINUR NEGATIVE 07/28/2017 1440   KETONESUR NEGATIVE 07/31/2017 1440   PROTEINUR NEGATIVE 07/10/2017 1440   NITRITE NEGATIVE 07/10/2017 1440   LEUKOCYTESUR NEGATIVE 07/19/2017 1440   Sepsis Labs: @LABRCNTIP (procalcitonin:4,lacticidven:4)  ) Recent Results (from the past 240 hour(s))  Urine culture     Status: None   Collection Time: 07/08/2017  2:42 PM  Result Value Ref Range Status   Specimen Description   Final    URINE, CATHETERIZED Performed at [facility], [address], [place], [place] [address]  Special Requests   Final    NONE Performed at [facility], [address] [place], [place] [address]    Culture   Final    NO GROWTH Performed at [facility], [place] [address] [place], [place] [address]    Report Status 08-22-2017 FINAL  Final         Radiology Studies: Dg Chest 2 View  Result Date: 07/10/2017 CLINICAL DATA:  Weakness. Patient complaining of abdominal pain starting this morning. States she has not had a bowel movement x 1 week. Per family, patient had syncopal episode at home this morning.HISTORY OF CANCER, HTN, SICKLE CELL TRAIT, DEPRESSION EXAM: CHEST - 2 VIEW COMPARISON:  Chest CT, 07/22/2017 FINDINGS: Left upper lobe mass is stable from the recent chest CT. Remainder of the lungs is clear. No pleural effusion or pneumothorax. Cardiac silhouette is normal in size. No mediastinal or hilar masses. No evidence of adenopathy. No acute skeletal abnormality.  No bone lesion. Stable changes from left breast surgery. IMPRESSION: 1. No acute cardiopulmonary disease. 2. Left upper lobe mass stable from the recent prior CT scan. Electronically  Signed   By: Lajean Manes M.D.   On: 07/16/2017 13:59   Ct Abdomen Pelvis W Contrast  Result Date: 07/20/2017 CLINICAL DATA:  Abdominal pain. No bowel movement 1 week. Syncopal episode this morning. EXAM: CT ABDOMEN AND PELVIS WITH CONTRAST TECHNIQUE: Multidetector CT imaging of the abdomen and pelvis was performed using the standard protocol following bolus administration of intravenous contrast. CONTRAST:  17mL ISOVUE-300 IOPAMIDOL (ISOVUE-300) INJECTION 61% COMPARISON:  CT scan July 22, 2017 FINDINGS: Lower chest: Most of the lung nodules seen on the recent chest CT are above the level of today's imaging. However, there are a few scattered nodules in the bases, similar in the interval. No acute infiltrate in the lung bases. Previous left mastectomy. The distal esophagus is normal. The lower chest is otherwise unremarkable. Hepatobiliary: Low-attenuation adjacent to the falciform ligament is likely focal fatty deposition, unchanged. No suspicious masses in the liver. The patient is status post cholecystectomy. Prominence of the common bile duct is consistent previous cholecystectomy, unchanged. The portal vein is patent. Pancreas: A tiny probable cyst in the pancreatic head measures 4 mm, unchanged. No acute abnormalities in the pancreas. No suspicious masses. Spleen: Normal in size without focal abnormality. Adrenals/Urinary Tract: Adrenal glands are normal. There are multiple cyst and small hyperdense cysts in both kidneys. The largest mass is seen off the lower pole of the right kidney, measuring up to 4.6 cm with an attenuation 23 Hounsfield units and multiple septations. No other suspicious masses in either kidney. Mild symmetric perinephric stranding is symmetric and nonacute. No renal stones, ureteral stones, or stones in the bladder. The bladder is normal in appearance. Stomach/Bowel: The stent distal duodenum is normal. No discrete gastric masses are identified on CT imaging. The recent pathology  report indicated a history a gastric mass which is not seen on this study. Again noted is significant wall thickening in the third and fourth portions of the duodenum. There is also wall thickening associated with loops of jejunum. There is a small bowel loop in the pelvis, probably ileum, with significant wall thickening as well. These findings are similar in the interval with no evidence of perforation. There is a large stool ball in the rectum which is new. No definitive rectal wall thickening or perirectal fat stranding. The stool ball measures up to 8 cm in transverse dimension. A few scattered colonic  diverticuli are seen without identified diverticulitis. There is moderate fecal loading in the right side of the colon and proximal transverse colon. The appendix is not clearly visualized but there is no secondary evidence of appendicitis. Vascular/Lymphatic: Atherosclerotic changes are seen in the nonaneurysmal aorta. No adenopathy identified. Reproductive: The patient is status post hysterectomy. There is a small 13 mm cyst in the right adnexa of doubtful significance. Other: No free air or free fluid.  No other acute abnormalities. Musculoskeletal: Degenerative changes in the hips and spine. No bony metastatic disease noted. IMPRESSION: 1. Large stool ball in the rectum without wall thickening or perirectal fat stranding. This finding is new. 2. Marked wall thickening associated with the duodenum, jejunum, and probably a pelvic loop of ileum. 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 3. There is a mass off the lower pole the right kidney measuring up to 4.6 cm with multiple septations in attenuation of 23 Hounsfield units. Recommend an MRI for complete characterization. If MRI is not pursued, recommend follow-up with CT imaging. 4. Most of the pulmonary nodules seen on recent CT imaging are not included on today's study. However, there are several small nodules in the lung  bases which could be metastatic given history. 5. Atherosclerotic changes in the aorta. Electronically Signed   By: Dorise Bullion III M.D   On: 08/05/2017 14:16   Dg Abd Portable 1v  Result Date: 2017/08/08 CLINICAL DATA:  Vomiting and abdominal pain tonight EXAM: PORTABLE ABDOMEN - 1 VIEW COMPARISON:  CT abdomen and pelvis 07/09/2017 FINDINGS: Scattered gas and stool in the colon without distention. Mildly dilated gas-filled mid abdominal small bowel. Changes may indicate ileus or enteritis. No radiopaque stones. Surgical clips in the right upper quadrant. Residual contrast material in the bladder. Degenerative changes in the spine and hips. IMPRESSION: Mildly dilated gas-filled mid abdominal small bowel may indicate ileus or enteritis. Previous CT demonstrated areas of bowel wall thickening suspicious for lymphoma or metastasis. Electronically Signed   By: Lucienne Capers M.D.   On: 08/08/17 04:53        Scheduled Meds: . docusate sodium  100 mg Oral BID  . [START ON 08/05/2017] pantoprazole  40 mg Intravenous Q12H  . senna  1 tablet Oral BID  . simvastatin  40 mg Oral QHS   Continuous Infusions: . sodium chloride 75 mL/hr at 2017/08/08 1028  . pantoprozole (PROTONIX) infusion 8 mg/hr (08/08/2017 0836)     LOS: 1 day    Time spent: 35 minutes    Dana Allan, MD  [facility] Pager #: [phone] 7PM-7AM contact night coverage as above

## 2017-08-07 NOTE — Progress Notes (Signed)
Pt still c/o pain and asking about pain med, none available at this time, paged Dr Marthenia Rolling to advise on option, family at bedside and verbalized understanding of paging md

## 2017-08-07 NOTE — Progress Notes (Signed)
Pt BP is now staying in the 84'X systolic, 32/44, HR 010, MD notified and aware, pt is now comfort care, will continue to monitor, Thanks Arvella Nigh RN

## 2017-08-07 NOTE — Progress Notes (Signed)
Caryl Pina RN attempted NGT 12 french insertion x2, tube was measured and tube advanced to tape but no drainage , pt vomitted around tube, the first time, pt did not cough or resp distress, paged Dr Oletta Lamas with GI and advised to call internal med, paged Dr Marcie Bal, await call back, pt also c/o of  Stomach pain and just received morphine

## 2017-08-07 NOTE — Consult Note (Signed)
Reason for the consult: Lung cancer     HPI: 77 year old woman currently of [place] where she lived the majority of her life.  She has history of breast cancer in 2009.  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  She remained disease free and was followed up till October 2018 at [facility] for survivorship clinic.  She was hospitalized on July 22, 2017 after presenting with symptoms of weakness and neck swelling and a CT scan of the chest showed a 4.5 x 5.2 upper lobe mass with bilateral pulmonary nodules as well as segmental small bowel thickening involving the duodenum.  CT scan of the maxillofacial area showed posterior nasopharyngeal soft tissue fullness extending the posterior aspect of the nasal cavity.  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  After her discharge on 07/26/2017, she presented on 07/21/2017 with complaints of rectal pain constipation and overall weakness and abdominal pain.  She was transferred from [facility] to [facility] for further evaluation.  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  Clinically, she appeared in severe pain complaining of abdominal discomfort nausea and coffee-ground emesis.  She denies any hematochezia or melena.  She does report hoarseness of her voice but no shortness of breath or wheezing.  She does report constipation with inability to have bowel movements on the last few weeks.  She does not report any headaches, blurry vision, syncope or seizures. Does not report any fevers, chills or sweats.  Does not report any cough, wheezing or hemoptysis.   Does not report any chest pain, palpitation, orthopnea or leg edema..  Does not report any skeletal complaints.    Does not report frequency, urgency or hematuria.  Does not report any skin rashes or lesions.  Does not report any lymphadenopathy or petechiae.  Does not report any anxiety or depression.  Remaining review of systems is negative.    Past Medical History:  Diagnosis Date  . Cancer ([place])    left breast  . Depression 12/24/2010  . Goiter   . HTN (hypertension) 12/24/2010  . Hypothyroidism 12/24/2010  . Infiltrating ductal carcinoma of breast, stage 1 ([place]) 12/24/2010  . Sickle cell trait ([place])   :  Past Surgical History:  Procedure Laterality Date  . ABDOMINAL HYSTERECTOMY    . APPENDECTOMY    . BIOPSY  07/24/2017   Procedure: BIOPSY;  Surgeon: Daneil Dolin, MD;  Location: [facility];  Service: Endoscopy;;  duodenal biopsy  . BIOPSY THYROID    . CATARACT EXTRACTION     right eye with lens implant  . CATARACT EXTRACTION  2008   right  . CATARACT EXTRACTION W/PHACO  04/16/2011   Procedure: CATARACT EXTRACTION PHACO AND INTRAOCULAR LENS PLACEMENT (IOC);  Surgeon: Elta Guadeloupe T. Gershon Crane, MD;  Location: [facility] ORS;  Service: Ophthalmology;  Laterality: Left;  CDE=7.66  . CHOLECYSTECTOMY    . COLON RESECTION  1965   tumor 4X4 inch per patient. ?colon or other per patient  . COLONOSCOPY N/A 08/12/2014   Procedure: COLONOSCOPY;  Surgeon: Danie Binder, MD;  Location: [facility];  Service: Endoscopy;  Laterality: N/A;  1300  . DENTAL SURGERY  11/2011  . ESOPHAGOGASTRODUODENOSCOPY (EGD) WITH PROPOFOL N/A 07/24/2017   Procedure: ESOPHAGOGASTRODUODENOSCOPY (EGD) WITH PROPOFOL;  Surgeon: Daneil Dolin, MD;  Location: [facility];  Service: Endoscopy;  Laterality: N/A;  . HEMORRHOID SURGERY    . MASTECTOMY  2009   left  . TONSILECTOMY, ADENOIDECTOMY, BILATERAL MYRINGOTOMY AND TUBES    :   Current Facility-Administered Medications:  .  0.45 % sodium chloride infusion, ,  Intravenous, Continuous, Paticia Stack, MD, Last Rate: 75 mL/hr at 06-Aug-2017 1028 .  acetaminophen (TYLENOL) tablet 650 mg, 650 mg, Oral, Q6H PRN **OR** acetaminophen (TYLENOL) suppository 650 mg, 650 mg, Rectal, Q6H PRN, Paticia Stack, MD .  ALPRAZolam Duanne Moron) tablet 0.25-0.5 mg, 0.25-0.5 mg, Oral, BID PRN, Paticia Stack, MD, 0.5 mg at 07/16/2017 1740 .  docusate sodium (COLACE) capsule 100 mg, 100 mg, Oral, BID, Paticia Stack, MD, 100 mg at 07/26/2017 2108 .  HYDROcodone-acetaminophen (NORCO/VICODIN) 5-325 MG per tablet 1 tablet, 1 tablet, Oral, Q4H PRN, Paticia Stack, MD, 1 tablet at 07/28/2017 2108 .  morphine 2 MG/ML injection 2 mg, 2 mg, Intravenous, Q4H PRN, Bodenheimer, Charles A, NP, 2 mg at 08-06-17 1029 .  ondansetron (ZOFRAN) tablet 4 mg, 4 mg, Oral, Q6H PRN **OR** ondansetron (ZOFRAN) injection 4 mg, 4 mg, Intravenous, Q6H PRN, Paticia Stack, MD, 4 mg at 2017-08-06 0829 .  pantoprazole (PROTONIX) 80 mg in sodium chloride 0.9 % 250 mL (0.32 mg/mL) infusion, 8 mg/hr, Intravenous, Continuous, Paticia Stack, MD, Last Rate: 25 mL/hr at August 06, 2017 0836, 8 mg/hr at 08/06/17 0836 .  [START ON 08/05/2017] pantoprazole (PROTONIX) injection 40 mg, 40 mg, Intravenous, Q12H, Paticia Stack, MD .  prochlorperazine (COMPAZINE) injection 10 mg, 10 mg, Intravenous, Q6H PRN, Bodenheimer, Charles A, NP, 10 mg at 2017/08/06 1038 .  senna (SENOKOT) tablet 8.6 mg, 1 tablet, Oral, BID, Paticia Stack, MD, 8.6 mg at 08/06/2017 2106 .  simethicone (MYLICON) chewable tablet 80 mg, 80 mg, Oral, Q6H PRN, Bodenheimer, Charles A, NP, 80 mg at 07/16/2017 1932 .  simvastatin (ZOCOR) tablet 40 mg, 40 mg, Oral, QHS, Paticia Stack, MD, 40 mg at 07/18/2017 2109 .  zolpidem (AMBIEN) tablet 5 mg, 5 mg, Oral, QHS PRN, Paticia Stack, MD:  Allergies  Allergen Reactions  . Sulfa Antibiotics Swelling  . Feldene [Piroxicam]   . Penicillins Rash    Has patient had a PCN reaction causing immediate rash, facial/tongue/throat swelling, SOB or  lightheadedness with hypotension: No Has patient had a PCN reaction causing severe rash involving mucus membranes or skin necrosis: No Has patient had a PCN reaction that required hospitalization: No Has patient had a PCN reaction occurring within the last 10 years: No If all of the above answers are "NO", then may proceed with Cephalosporin use.   :  Family History  Problem Relation Age of Onset  . Breast cancer Sister   . Cancer Mother   . Sickle cell anemia Father   . Anesthesia problems Neg Hx   . Hypotension Neg Hx   . Malignant hyperthermia Neg Hx   . Pseudochol deficiency Neg Hx   :  Social History   Socioeconomic History  . Marital status: Widowed    Spouse name: Not on file  . Number of children: 2  . Years of education: Not on file  . Highest education level: Not on file  Occupational History  . Not on file  Social  Needs  . Financial resource strain: Not on file  . Food insecurity:    Worry: Not on file    Inability: Not on file  . Transportation needs:    Medical: Not on file    Non-medical: Not on file  Tobacco Use  . Smoking status: Never Smoker  . Smokeless tobacco: Never Used  Substance and Sexual Activity  . Alcohol use: No  . Drug use: No  . Sexual activity: Yes    Birth control/protection: Surgical  Lifestyle  . Physical activity:    Days per week: Not on file    Minutes per session: Not on file  . Stress: Not on file  Relationships  . Social connections:    Talks on phone: Not on file    Gets together: Not on file    Attends religious service: Not on file    Active member of club or organization: Not on file    Attends meetings of clubs or organizations: Not on file    Relationship status: Not on file  . Intimate partner violence:    Fear of current or ex partner: Not on file    Emotionally abused: Not on file    Physically abused: Not on file    Forced sexual activity: Not on file  Other Topics Concern  . Not on file  Social History  Narrative  . Not on file  :  Pertinent items are noted in HPI.  Exam: Blood pressure 121/69, pulse (!) 145, temperature 97.8 F (36.6 C), temperature source Oral, resp. rate 14, height '5\' 7"'$  (1.702 m), weight 152 lb 12.5 oz (69.3 kg), SpO2 97 %.  ECOG 1 General appearance: Ill-appearing woman appeared in some distress. Head: atraumatic without any abnormalities. Eyes: conjunctivae/corneas clear. PERRL.  Sclera anicteric. Throat: Oral mucosa is dry without thrush or ulcers. Resp: clear to auscultation bilaterally without rhonchi, wheezes or dullness to percussion. Cardio: Tachycardic without any murmurs or gallops.  Regular rhythm. GI: Distended and tender to touch.  Good bowel sounds auscultated. Skin: Skin color, texture, turgor normal. No rashes or lesions Lymph nodes: Cervical, supraclavicular, and axillary nodes normal. Neurologic: Grossly normal without any motor, sensory or deep tendon reflexes. Musculoskeletal: No joint deformity or effusion.  Recent Labs    07/17/2017 1115 05-Aug-2017 0523  WBC 24.0* 9.6  HGB 7.3* 12.3  HCT 23.4* 37.0  PLT 317 359   Recent Labs    07/23/2017 1115 05-Aug-2017 0523  NA 140 140  K 4.7 4.2  CL 100 104  CO2 31 25  GLUCOSE 160* 185*  BUN 20 17  CREATININE 0.90 1.06*  CALCIUM 8.2* 8.2*     Ct Soft Tissue Neck W Contrast  Result Date: 07/24/2017 CLINICAL DATA:  Sinus congestion with RIGHT-sided throat pain. EXAM: CT NECK WITH CONTRAST TECHNIQUE: Multidetector CT imaging of the neck was performed using the standard protocol following the bolus administration of intravenous contrast. CONTRAST:  77m OMNIPAQUE IOHEXOL 300 MG/ML  SOLN COMPARISON:  CT maxillofacial 07/22/2017. CT chest, abdomen and pelvis 07/22/2017. thyroid sonography 08/28/2007. FINDINGS: Pharynx and larynx: Bulky nasopharyngeal mass, filling the nasopharynx, displacing the soft palate and uvula anteriorly, approximate 24 x 28 x 37 mm. No eustachian tube dysfunction. No  oropharyngeal or hypopharyngeal masses. Normal larynx. Salivary glands: No inflammation, mass, or stone. Thyroid: Bulky mass involving much of the RIGHT lobe of the thyroid, with nodular solid, cystic, and peripherally calcified components, measuring 28 x 39 x 52 mm. This mass was present in 2009,  similar size, and tissue sampling was recommended in 2009, but I have no indication of whether this was actually performed. Based on the sonographic appearance at that time as well as the CT appearance today, those recommendations remain pertinent. Lymph nodes: Malignant-appearing adenopathy in the neck. RIGHT retropharyngeal node of root VA, greater than 2 cm long axis, could represent adjacent spread of tumor from the nasopharyngeal mass. BILATERAL level 2 lymph nodes, greater on the LEFT, 15 mm short axis no definite level 3, level 4, or level 5 nodal enlargement. Vascular: Patent. Limited intracranial: Not assessed. Visualized orbits: Grossly negative, partially visualized. Mastoids and visualized paranasal sinuses: Layering fluid in the RIGHT sphenoid sinus. Frontal sinus not visualized. No mastoid effusions. Skeleton: Spondylosis.  No osseous destructive lesion. Upper chest: LEFT upper lobe mass, and numerous pulmonary nodules; see report of chest CT 07/22/2017. Other: None. 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 Enlarged RIGHT retropharyngeal lymph node could represent local spread, and contribute to the patient's RIGHT-sided symptoms. BILATERAL level 2 adenopathy, also malignant-appearing. Large RIGHT thyroid nodule described in 2009, appears similar; tissue sampling was recommended at that time although it is unclear if this occurred. Findings discussed with ordering provider at the time dictation. Electronically Signed   By: Staci Righter M.D.   On: 07/24/2017 11:40   Ct Chest W Contrast  Result Date:  07/22/2017 CLINICAL DATA:  77 year old female with rectal bleeding. EXAM: CT CHEST, ABDOMEN, AND PELVIS WITH CONTRAST TECHNIQUE: Multidetector CT imaging of the chest, abdomen and pelvis was performed following the standard protocol during bolus administration of intravenous contrast. CONTRAST:  35m OMNIPAQUE IOHEXOL 300 MG/ML  SOLN COMPARISON:  None. FINDINGS: CT CHEST FINDINGS Cardiovascular: There is no cardiomegaly or pericardial effusion. There is coronary vascular calcification primarily involving the LAD. Mild atherosclerotic calcification of the aortic arch. The visualized origins of the great vessels of the aortic arch are patent. The central pulmonary arteries are grossly unremarkable. Mediastinum/Nodes: There is no hilar or mediastinal adenopathy. The esophagus is grossly unremarkable. There is a 2.5 x 3.9 cm right thyroid cystic or necrotic nodule with multiple enhancing intracystic nodules. Further evaluation with thyroid ultrasound recommended. Additional smaller nodules noted in the left thyroid lobe. Lungs/Pleura: There is a 4.5 x 5.2 cm pleural based mass in the left upper lobe. There are multiple small bilateral nodular densities which may represent metastatic disease, or an atypical infection. There is no pleural effusion or pneumothorax. The central airways are patent. Musculoskeletal: There is no axillary adenopathy. Surgical clips of the left axilla and left-sided mastectomy noted. There is osteopenia with degenerative changes of the spine. There is diffuse heterogeneity of the spine which may be secondary to osteopenia or represent small metastatic disease. CT ABDOMEN PELVIS FINDINGS There is no intra-abdominal free air or free fluid. Hepatobiliary: Subcentimeter hypodense lesion in the anterior liver (series 2, image 46) is too small to characterize. No intrahepatic biliary ductal dilatation. Cholecystectomy. Pancreas: Unremarkable. No pancreatic ductal dilatation or surrounding inflammatory  changes. Spleen: Normal in size without focal abnormality. Adrenals/Urinary Tract: The adrenal glands are unremarkable. Multiple bilateral renal cysts as well as smaller hypodense lesions which are too small to characterize. There is no hydronephrosis on either side. There is symmetric enhancement and excretion of contrast by both kidneys. The visualized ureters and urinary bladder appear unremarkable. Stomach/Bowel: There is thickening of the wall of the duodenum and jejunal loops in the left upper abdomen with luminal dilatation highly  suspicious for an infiltrative/neoplastic process such as lymphoma. There is thickening and luminal dilatation of a segment of bowel in the pelvis also concerning for lymphoma. Mural hemorrhage within the bowel wall is a less likely possibility if the patient is on anticoagulation. There is no bowel obstruction. There is colonic diverticulosis without active inflammatory changes. Appendectomy. Vascular/Lymphatic: Moderate aortoiliac atherosclerotic disease. No portal venous gas. There is no adenopathy. Reproductive: Hysterectomy. There is a 16 mm right ovarian cyst. The left ovary is not visualized with certainty Other: None Musculoskeletal: Osteopenia with degenerative changes of the spine no acute osseous pathology. IMPRESSION: 1. Left upper lobe pleural based mass. 2. Innumerable small pulmonary nodules may represent metastatic disease versus atypical infection. Clinical correlation is recommended. 3. Segment of bowel wall thickening and luminal dilatation involving the duodenum, jejunum, as well as distal small bowel suspicious for bowel lymphoma. PET-CT is recommended for further evaluation of pulmonary and bowel findings. 4. Colonic diverticulosis.  No bowel obstruction. 5. Osteopenia. Heterogeneity of the spine may be related to osteopenia although small metastatic lesions are not excluded. 6. Left mastectomy. 7.  Aortic Atherosclerosis (ICD10-I70.0). Electronically Signed    By: Anner Crete M.D.   On: 07/22/2017 23:44   Ct Abdomen Pelvis W Contrast  Result Date: 07/11/2017 CLINICAL DATA:  Abdominal pain. No bowel movement 1 week. Syncopal episode this morning. EXAM: CT ABDOMEN AND PELVIS WITH CONTRAST TECHNIQUE: Multidetector CT imaging of the abdomen and pelvis was performed using the standard protocol following bolus administration of intravenous contrast. CONTRAST:  164m ISOVUE-300 IOPAMIDOL (ISOVUE-300) INJECTION 61% COMPARISON:  CT scan July 22, 2017 FINDINGS: Lower chest: Most of the lung nodules seen on the recent chest CT are above the level of today's imaging. However, there are a few scattered nodules in the bases, similar in the interval. No acute infiltrate in the lung bases. Previous left mastectomy. The distal esophagus is normal. The lower chest is otherwise unremarkable. Hepatobiliary: Low-attenuation adjacent to the falciform ligament is likely focal fatty deposition, unchanged. No suspicious masses in the liver. The patient is status post cholecystectomy. Prominence of the common bile duct is consistent previous cholecystectomy, unchanged. The portal vein is patent. Pancreas: A tiny probable cyst in the pancreatic head measures 4 mm, unchanged. No acute abnormalities in the pancreas. No suspicious masses. Spleen: Normal in size without focal abnormality. Adrenals/Urinary Tract: Adrenal glands are normal. There are multiple cyst and small hyperdense cysts in both kidneys. The largest mass is seen off the lower pole of the right kidney, measuring up to 4.6 cm with an attenuation 23 Hounsfield units and multiple septations. No other suspicious masses in either kidney. Mild symmetric perinephric stranding is symmetric and nonacute. No renal stones, ureteral stones, or stones in the bladder. The bladder is normal in appearance. Stomach/Bowel: The stent distal duodenum is normal. No discrete gastric masses are identified on CT imaging. The recent pathology report  indicated a history a gastric mass which is not seen on this study. Again noted is significant wall thickening in the third and fourth portions of the duodenum. There is also wall thickening associated with loops of jejunum. There is a small bowel loop in the pelvis, probably ileum, with significant wall thickening as well. These findings are similar in the interval with no evidence of perforation. There is a large stool ball in the rectum which is new. No definitive rectal wall thickening or perirectal fat stranding. The stool ball measures up to 8 cm in transverse dimension. A few scattered  colonic diverticuli are seen without identified diverticulitis. There is moderate fecal loading in the right side of the colon and proximal transverse colon. The appendix is not clearly visualized but there is no secondary evidence of appendicitis. Vascular/Lymphatic: Atherosclerotic changes are seen in the nonaneurysmal aorta. No adenopathy identified. Reproductive: The patient is status post hysterectomy. There is a small 13 mm cyst in the right adnexa of doubtful significance. Other: No free air or free fluid.  No other acute abnormalities. Musculoskeletal: Degenerative changes in the hips and spine. No bony metastatic disease noted. IMPRESSION: 1. Large stool ball in the rectum without wall thickening or perirectal fat stranding. This finding is new. 2. Marked wall thickening associated with the duodenum, jejunum, and probably a pelvic loop of ileum. 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 3. There is a mass off the lower pole the right kidney measuring up to 4.6 cm with multiple septations in attenuation of 23 Hounsfield units. Recommend an MRI for complete characterization. If MRI is not pursued, recommend follow-up with CT imaging. 4. Most of the pulmonary nodules seen on recent CT imaging are not included on today's study. However, there are several small nodules in the lung bases  which could be metastatic given history. 5. Atherosclerotic changes in the aorta. Electronically Signed   By: Dorise Bullion III M.D   On: 07/31/2017 14:16   Ct Abdomen Pelvis W Contrast  Result Date: 07/22/2017 CLINICAL DATA:  77 year old female with rectal bleeding. EXAM: CT CHEST, ABDOMEN, AND PELVIS WITH CONTRAST TECHNIQUE: Multidetector CT imaging of the chest, abdomen and pelvis was performed following the standard protocol during bolus administration of intravenous contrast. CONTRAST:  75m OMNIPAQUE IOHEXOL 300 MG/ML  SOLN COMPARISON:  None. FINDINGS: CT CHEST FINDINGS Cardiovascular: There is no cardiomegaly or pericardial effusion. There is coronary vascular calcification primarily involving the LAD. Mild atherosclerotic calcification of the aortic arch. The visualized origins of the great vessels of the aortic arch are patent. The central pulmonary arteries are grossly unremarkable. Mediastinum/Nodes: There is no hilar or mediastinal adenopathy. The esophagus is grossly unremarkable. There is a 2.5 x 3.9 cm right thyroid cystic or necrotic nodule with multiple enhancing intracystic nodules. Further evaluation with thyroid ultrasound recommended. Additional smaller nodules noted in the left thyroid lobe. Lungs/Pleura: There is a 4.5 x 5.2 cm pleural based mass in the left upper lobe. There are multiple small bilateral nodular densities which may represent metastatic disease, or an atypical infection. There is no pleural effusion or pneumothorax. The central airways are patent. Musculoskeletal: There is no axillary adenopathy. Surgical clips of the left axilla and left-sided mastectomy noted. There is osteopenia with degenerative changes of the spine. There is diffuse heterogeneity of the spine which may be secondary to osteopenia or represent small metastatic disease. CT ABDOMEN PELVIS FINDINGS There is no intra-abdominal free air or free fluid. Hepatobiliary: Subcentimeter hypodense lesion in the  anterior liver (series 2, image 46) is too small to characterize. No intrahepatic biliary ductal dilatation. Cholecystectomy. Pancreas: Unremarkable. No pancreatic ductal dilatation or surrounding inflammatory changes. Spleen: Normal in size without focal abnormality. Adrenals/Urinary Tract: The adrenal glands are unremarkable. Multiple bilateral renal cysts as well as smaller hypodense lesions which are too small to characterize. There is no hydronephrosis on either side. There is symmetric enhancement and excretion of contrast by both kidneys. The visualized ureters and urinary bladder appear unremarkable. Stomach/Bowel: There is thickening of the wall of the duodenum and jejunal loops in the left upper abdomen  with luminal dilatation highly suspicious for an infiltrative/neoplastic process such as lymphoma. There is thickening and luminal dilatation of a segment of bowel in the pelvis also concerning for lymphoma. Mural hemorrhage within the bowel wall is a less likely possibility if the patient is on anticoagulation. There is no bowel obstruction. There is colonic diverticulosis without active inflammatory changes. Appendectomy. Vascular/Lymphatic: Moderate aortoiliac atherosclerotic disease. No portal venous gas. There is no adenopathy. Reproductive: Hysterectomy. There is a 16 mm right ovarian cyst. The left ovary is not visualized with certainty Other: None Musculoskeletal: Osteopenia with degenerative changes of the spine no acute osseous pathology. IMPRESSION: 1. Left upper lobe pleural based mass. 2. Innumerable small pulmonary nodules may represent metastatic disease versus atypical infection. Clinical correlation is recommended. 3. Segment of bowel wall thickening and luminal dilatation involving the duodenum, jejunum, as well as distal small bowel suspicious for bowel lymphoma. PET-CT is recommended for further evaluation of pulmonary and bowel findings. 4. Colonic diverticulosis.  No bowel obstruction.  5. Osteopenia. Heterogeneity of the spine may be related to osteopenia although small metastatic lesions are not excluded. 6. Left mastectomy. 7.  Aortic Atherosclerosis (ICD10-I70.0). Electronically Signed   By: Anner Crete M.D.   On: 07/22/2017 23:44    Ct Maxillofacial Wo Contrast  Result Date: 07/22/2017 CLINICAL DATA:  Pt c/o sinus pressure with pain and overall weakness since being diagnosed with sinus infection and uti at the end of June. pt complained of rt side swelling just below mandible EXAM: CT MAXILLOFACIAL WITHOUT CONTRAST TECHNIQUE: Multidetector CT imaging of the maxillofacial structures was performed. Multiplanar CT image reconstructions were also generated. COMPARISON:  None. 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 Multiple teeth are absent. There is a cavity of the remaining right maxillary molar. 1.0 by 0.6 by 0.8 cm lesion along the upper margin of the right frontal sinus has some endosteal scalloping and some faint spiculations of bone, but generally nonaggressive characteristics. There is some bony demineralization.  Cervical spondylosis noted. Orbits: Unremarkable Sinuses: Mild chronic right sphenoid sinusitis. Soft tissues: Posterior nasopharyngeal soft tissue fullness extending somewhat into the right nasal cavity. Polypoid lesion or mass not excluded, and inspection is recommended. Left level II lymph node 1.3 cm in short axis on image 81/2. Right level IIb lymph node 1.0 cm in short axis. Suspected leftward deviation of the glottis, although there is extensive motion artifact making this difficult to be certain of. Limited intracranial: Unremarkable IMPRESSION: 1. Posterior nasopharyngeal soft tissue fullness extending somewhat into the posterior aspect of the right nasal cavity. Malignancy is not excluded and inspection of the posterior nasopharynx is recommended. 2. Bilateral mild level 2 adenopathy, also raising some concern for potential  malignancy. 3. Small expansile lesion with endosteal scalloping along the upper margin of the right frontal sinus. This has mostly nonaggressive characteristics, suggesting hemangioma, cholesterol granuloma, or similar lesion. 4. Bilateral torus mandibularis. 5. Cavity of the remaining right maxillary molar. 6. Mild chronic right sphenoid sinusitis. 7. Bony demineralization. Electronically Signed   By: Van Clines M.D.   On: 07/22/2017 21:53    Assessment and Plan:   77 year old woman with the following:  1.  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  She has a left upper lobe mass measuring 4 x 5 x 5.2 cm detected on a CT scan of the chest obtained on 07/22/2017.  Her a CT scan of the abdomen showed small bowel thickening likely involvement of small cell lung cancer in  addition to the bilateral pulmonary nodules.  These findings discussed today with the patient and her sister that is present bedside today.  The implication of this diagnosis as well as the natural course of this disease was discussed today in detail.  She understands this is a rather aggressive life-threatening malignancy with widespread in her body that is immediately life-threatening without active treatment.  Treatment options were discussed today in detail.  There is aggressive approach would include systemic chemotherapy utilizing carboplatin and etoposide for 3 consecutive days followed by growth factor support every 3 weeks for 6 cycles.  This therapy needs to be started in the immediate future further delay will likely threaten her life further.  I anticipate reasonable response to chemotherapy from her cancer but it is unlikely to be curable in the setting.  Chemotherapy will offer her potential palliation of her symptoms.  Alternative approach would be comfort care and supportive care only.  She understands without any treatment at this time her disease will be spreading rather rapidly and it is  universally fatal in a short period of time.  After discussion today, she is wanting to have aggressive therapy and wants steps to improve her health in the immediate future.  Based on her decision, I have recommended transfer to [facility] long hospital in the next 24 to 48 hours for potential start of chemotherapy at that time if she is clinically stable.  Complications associated with chemotherapy was discussed today in detail.  This will include nausea, vomiting, myelosuppression, neutropenia, neutropenic sepsis, infusion related complications among others.  2.  IV access: She will require a Port-A-Cath insertion at some point.  This can be delayed at this time and peripheral veins can be used to start chemotherapy instead.  3.  Abdominal pain, GI bleed and severe constipation: This undoubtedly related to her malignancy which she is experiencing more coffee-ground emesis which is worrisome.  I would recommend a GI consultation again for possible management of her GI symptoms and to repeat endoscopy if indicated.  Aggressive bowel regimen may be needed to improve her bowel movements as the treatment for her cancer is ongoing.  4.  Nasopharyngeal mass: It would be an unusual location for small cell lung cancer to spread but it is certainly possible.  A separate malignancy such as a squamous cell carcinoma of the nasopharynx is also a possibility.  I recommend consulting ENT for the management of this nasopharyngeal mass and consideration for airway protection.  5.  Prognosis: She is facing long odds of recovery given the aggressive nature of her cancer and extent of her disease.  Her cancer can be palliated with systemic chemotherapy with high response rate but will not likely to cure.  Having palliative care involvement is a reasonable accept for goals of care as well as symptom management.  I agree with DNR status at this time.  If she experiences cardiopulmonary collapse, at the recovery is very  low.  I will continue to follow her progress in the next 24 to 48 hours.  When she is at [facility] long hospital will commence systemic chemotherapy anticipate in the next 48 hours.  I will communicate the plan with her 2 sons based on her wishes.  They were not present today at bedside but will reach out to them via phone today.  80  minutes was spent with the patient face-to-face today.  More than 50% of time was dedicated to patient counseling, education and discussing her diagnosis, prognosis the ramification  of her treatment options.

## 2017-08-07 DEATH — deceased

## 2017-08-19 NOTE — Discharge Summary (Signed)
Discharge summary.  Admitted on 08-Aug-2017. Died on 08/09/17.  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  Patient was hospitalized recently from 7/16-7/20 due to melena, anemia due to UGIB requiring possible units PRBC, s/p EGD with biopsy of duodenum mass (pathology of the duodenal mass revealed small cell lung cancer cells).  Patient was found to have left upper lung mass and several lung nodules as well as bulky nasopharyngeal mass and enlarged retropharyngeal lymph node on the right; and bilateral cervical lymph node.  Patient was admitted with severe rectal pain due to severe constipation and profound weakness. She also felt herself dehydrated.  Abdominal x-ray on admission revealed Mildly dilated gas-filled mid abdominal small bowel, said to possibly indicate ileus or enteritis. Previous CT demonstrated areas of bowel wall thickening suspicious for lymphoma or metastasis.  Patient was initially seen at [facility], but was transferred to [facility] due to complexity of the patient's medical condition.  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  Palliative care team was also consulted as the patient's prognosis was thought to be guarded.  Patient continued to deteriorate rapidly, with significant abdominal pain.  Lactic acid was repeated and it was greater than 10.  After extensive discussion with patient and patient's son, comfort directed care was pursued.  Patient died shortly afterwards.  Patient died on 08-09-2017.  Diagnosis: Metastatic small cell lung cancer, with metastasis to the GI  tract. Possible bowel ischemia. Abdominal pain. Nausea and vomiting. Rectal pain. Acute blood loss anemia. Volume depletion.

## 2018-05-05 NOTE — Progress Notes (Signed)
REVIEWED-NO ADDITIONAL RECOMMENDATIONS. 

## 2018-12-31 IMAGING — DX DG CHEST 2V
2 series · 2 of 2 positions shown · non-contrast
Comparison: Chest CT, 07/22/2017

CLINICAL DATA: Weakness.

Patient complaining of abdominal pain starting this morning. States
she has not had a bowel movement x 1 week. Per family, patient had
syncopal episode at home this morning.HISTORY OF CANCER, HTN, SICKLE
CELL TRAIT, DEPRESSION
EXAM:
CHEST - 2 VIEW

[chest lat]
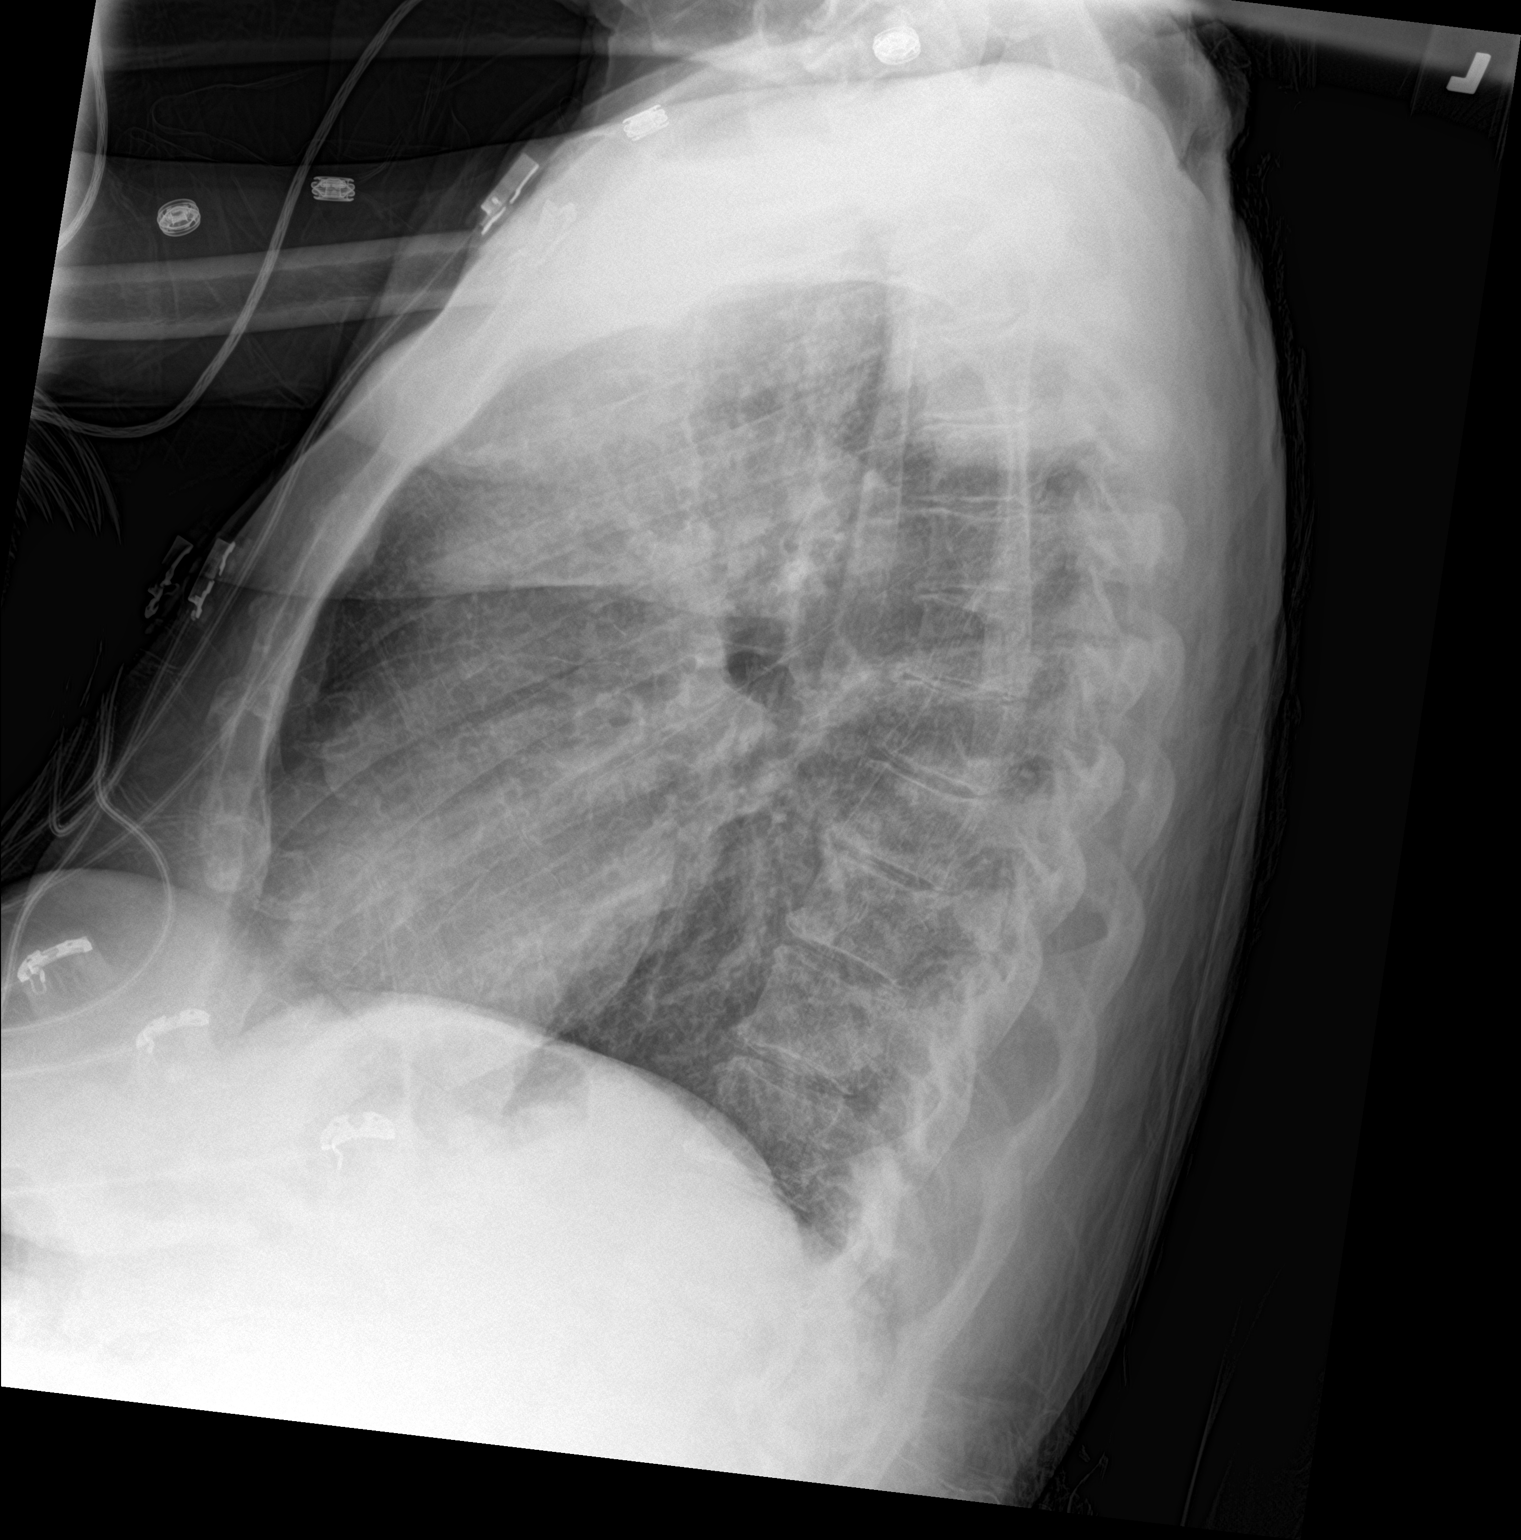

[chest ap]
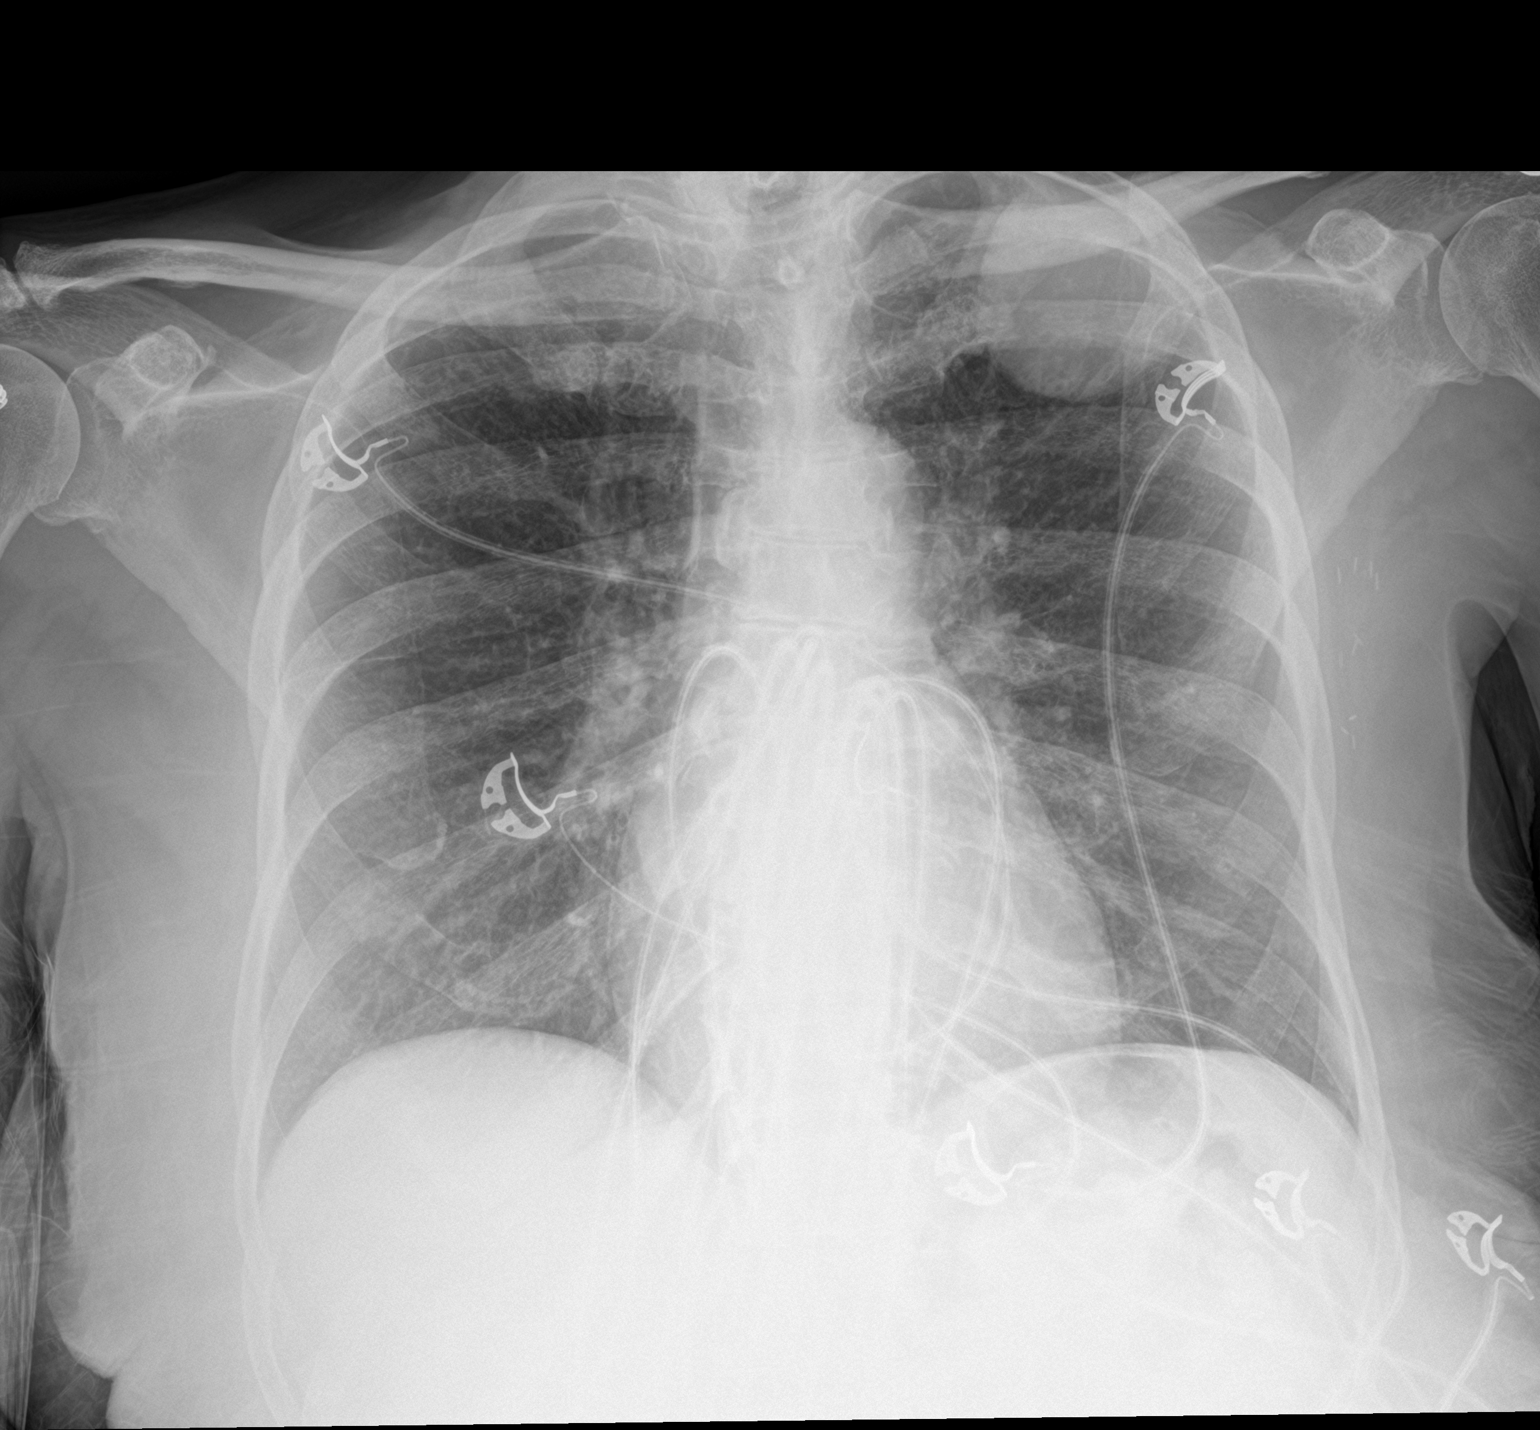

[2 of 2 positions shown; findings below may reference images not displayed]

FINDINGS: Left upper lobe mass is stable from the recent chest CT.

Remainder of the lungs is clear. No pleural effusion or
pneumothorax.

Cardiac silhouette is normal in size. No mediastinal or hilar
masses. No evidence of adenopathy.

No acute skeletal abnormality.  No bone lesion.

Stable changes from left breast surgery.
IMPRESSION: 1. No acute cardiopulmonary disease.
2. Left upper lobe mass stable from the recent prior CT scan.

## 2019-01-01 IMAGING — DX DG ABD PORTABLE 1V
1 series · 2 of 2 positions shown · non-contrast
Comparison: CT abdomen and pelvis 08/01/2017

CLINICAL DATA: Vomiting and abdominal pain tonight

EXAM:
PORTABLE ABDOMEN - 1 VIEW

[Series 1: abdomen · 0.14mm/px · 2 of 2 slices shown]
[im 1/2]
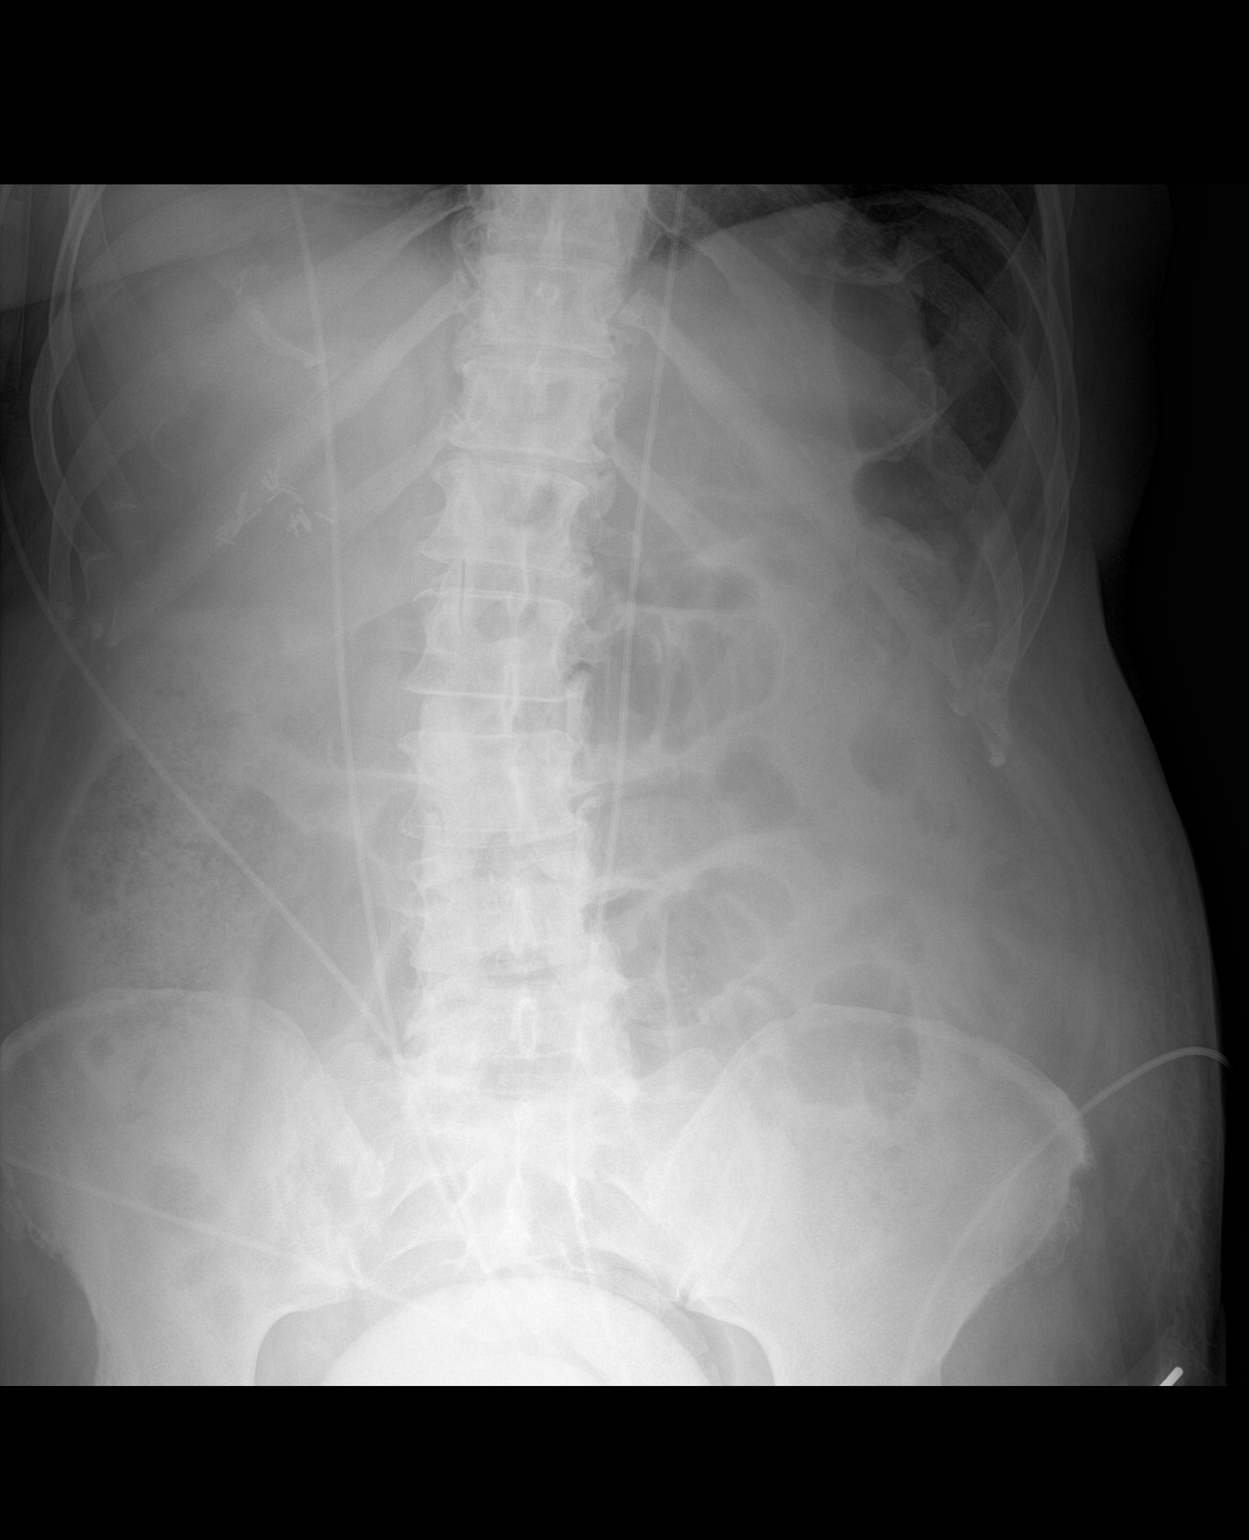
[im 2/2]
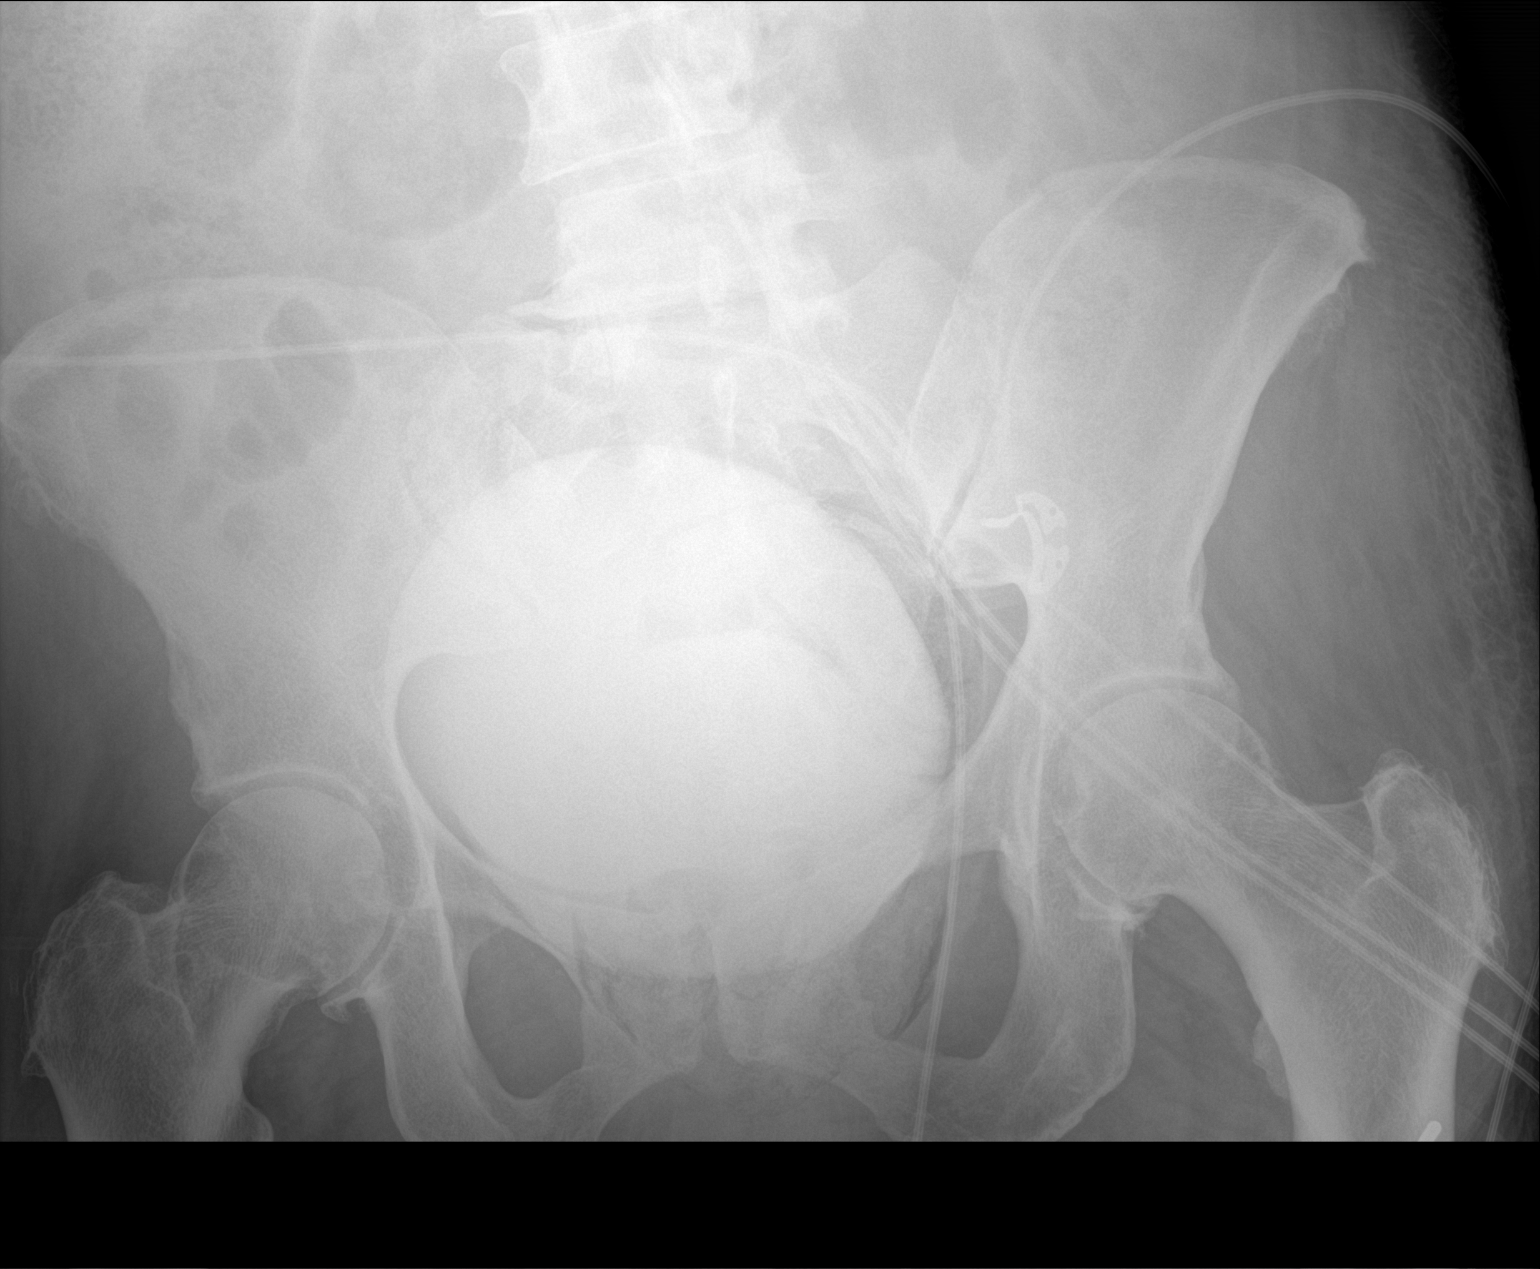

[2 of 2 positions shown; findings below may reference images not displayed]

FINDINGS: Scattered gas and stool in the colon without distention. Mildly
dilated gas-filled mid abdominal small bowel. Changes may indicate
ileus or enteritis. No radiopaque stones. Surgical clips in the
right upper quadrant. Residual contrast material in the bladder.
Degenerative changes in the spine and hips.
IMPRESSION: Mildly dilated gas-filled mid abdominal small bowel may indicate
ileus or enteritis. Previous CT demonstrated areas of bowel wall
thickening suspicious for lymphoma or metastasis.
